# Patient Record
Sex: Female | Born: 1955
Health system: Southern US, Community
[De-identification: ages and names within clinical notes are randomized; demographics above are authoritative.]

## PROBLEM LIST (undated history)

## (undated) DIAGNOSIS — R519 Headache, unspecified: Secondary | ICD-10-CM

## (undated) DIAGNOSIS — F32A Depression, unspecified: Secondary | ICD-10-CM

## (undated) DIAGNOSIS — E785 Hyperlipidemia, unspecified: Secondary | ICD-10-CM

## (undated) DIAGNOSIS — R51 Headache: Secondary | ICD-10-CM

## (undated) DIAGNOSIS — F329 Major depressive disorder, single episode, unspecified: Secondary | ICD-10-CM

## (undated) DIAGNOSIS — I10 Essential (primary) hypertension: Secondary | ICD-10-CM

## (undated) DIAGNOSIS — T7840XA Allergy, unspecified, initial encounter: Secondary | ICD-10-CM

## (undated) DIAGNOSIS — F419 Anxiety disorder, unspecified: Secondary | ICD-10-CM

## (undated) DIAGNOSIS — R011 Cardiac murmur, unspecified: Secondary | ICD-10-CM

## (undated) DIAGNOSIS — M1990 Unspecified osteoarthritis, unspecified site: Secondary | ICD-10-CM

## (undated) DIAGNOSIS — Z86010 Personal history of colonic polyps: Principal | ICD-10-CM

## (undated) HISTORY — PX: OTHER SURGICAL HISTORY: SHX169

## (undated) HISTORY — DX: Essential (primary) hypertension: I10

## (undated) HISTORY — DX: Allergy, unspecified, initial encounter: T78.40XA

## (undated) HISTORY — DX: Hyperlipidemia, unspecified: E78.5

## (undated) HISTORY — PX: JOINT REPLACEMENT: SHX530

## (undated) HISTORY — DX: Personal history of colonic polyps: Z86.010

## (undated) HISTORY — DX: Anxiety disorder, unspecified: F41.9

## (undated) HISTORY — DX: Depression, unspecified: F32.A

## (undated) HISTORY — DX: Major depressive disorder, single episode, unspecified: F32.9

## (undated) HISTORY — PX: FUNCTIONAL ENDOSCOPIC SINUS SURGERY: SUR616

---

## 1991-11-14 HISTORY — PX: TUBAL LIGATION: SHX77

## 1998-06-14 ENCOUNTER — Ambulatory Visit (HOSPITAL_COMMUNITY): Admission: RE | Admit: 1998-06-14 | Discharge: 1998-06-14 | Payer: Self-pay | Admitting: Obstetrics and Gynecology

## 2000-03-20 ENCOUNTER — Other Ambulatory Visit: Admission: RE | Admit: 2000-03-20 | Discharge: 2000-03-20 | Payer: Self-pay | Admitting: Obstetrics and Gynecology

## 2001-04-23 ENCOUNTER — Other Ambulatory Visit: Admission: RE | Admit: 2001-04-23 | Discharge: 2001-04-23 | Payer: Self-pay | Admitting: Obstetrics and Gynecology

## 2002-05-07 ENCOUNTER — Other Ambulatory Visit: Admission: RE | Admit: 2002-05-07 | Discharge: 2002-05-07 | Payer: Self-pay | Admitting: Obstetrics and Gynecology

## 2003-05-20 ENCOUNTER — Other Ambulatory Visit: Admission: RE | Admit: 2003-05-20 | Discharge: 2003-05-20 | Payer: Self-pay | Admitting: Obstetrics & Gynecology

## 2004-07-26 ENCOUNTER — Other Ambulatory Visit: Admission: RE | Admit: 2004-07-26 | Discharge: 2004-07-26 | Payer: Self-pay | Admitting: Obstetrics and Gynecology

## 2004-11-13 HISTORY — PX: COLONOSCOPY: SHX174

## 2005-10-13 ENCOUNTER — Other Ambulatory Visit: Admission: RE | Admit: 2005-10-13 | Discharge: 2005-10-13 | Payer: Self-pay | Admitting: Obstetrics and Gynecology

## 2005-10-31 ENCOUNTER — Ambulatory Visit: Payer: Self-pay | Admitting: Internal Medicine

## 2005-11-09 ENCOUNTER — Ambulatory Visit: Payer: Self-pay | Admitting: Internal Medicine

## 2006-12-06 ENCOUNTER — Encounter: Admission: RE | Admit: 2006-12-06 | Discharge: 2006-12-06 | Payer: Self-pay | Admitting: Obstetrics and Gynecology

## 2010-11-17 ENCOUNTER — Encounter: Payer: Self-pay | Admitting: Internal Medicine

## 2010-11-17 ENCOUNTER — Telehealth: Payer: Self-pay | Admitting: Internal Medicine

## 2010-12-04 ENCOUNTER — Encounter: Payer: Self-pay | Admitting: Obstetrics and Gynecology

## 2010-12-15 NOTE — Progress Notes (Signed)
Summary: Schedule recall Colonoscopy  Phone Note Outgoing Call Call back at [facility] Phone [phone]   Call placed by: Christie Nottingham CMA Duncan Dull),  November 17, 2010 1:47 PM Call placed to: Patient Summary of Call: Called to schedule recall colonoscopy with propofol. All numbers in EMR and in IDX are either disconnected or no longer this patients number. Letter has been sent to the home adress we have on file.  Initial call taken by: Christie Nottingham CMA (AAMA),  November 17, 2010 1:48 PM

## 2010-12-15 NOTE — Letter (Signed)
Summary: Colonoscopy Letter   Gastroenterology  [address] [place], [place] [address]   Phone: [phone]  Fax: [phone]      November 17, 2010 MRN: [id number]   [facility] [address] [place], [place]  [address]   Dear Kristin Benjamin,   According to your medical record, it is time for you to schedule a Colonoscopy. The American Cancer Society recommends this procedure as a method to detect early colon cancer. Patients with a family history of colon cancer, or a personal history of colon polyps or inflammatory bowel disease are at increased risk.  This letter has been generated based on the recommendations made at the time of your procedure. If you feel that in your particular situation this may no longer apply, please contact our office.  Please call our office at [phone] to schedule this appointment or to update your records at your earliest convenience.  Thank you for cooperating with [place] to provide you with the very best care possible.   Sincerely,    [facility] [phone]

## 2012-07-29 ENCOUNTER — Encounter: Payer: Self-pay | Admitting: Internal Medicine

## 2012-07-30 ENCOUNTER — Encounter: Payer: Self-pay | Admitting: Internal Medicine

## 2012-08-29 ENCOUNTER — Telehealth: Payer: Self-pay | Admitting: Internal Medicine

## 2012-08-29 NOTE — Telephone Encounter (Signed)
Patient reports that she does not have a family history of colon cancer as listed on the colonoscopy 11/09/2005.  She states that she has since found out that the history she provided was incorrect.  She apologizes for the misinformation and asks that Dr. Leone Payor review her chart for the proper recall date.  No current problems.  Colon from 2006 was normal.  I will place a copy in your office for review.  Please advise proper recall change date if any.

## 2012-08-29 NOTE — Telephone Encounter (Signed)
She is correct - next colon 10/2015

## 2012-08-30 NOTE — Telephone Encounter (Signed)
Patient aware.  Recall date placed. Both pre-visit and colon appt cancelled

## 2012-09-17 ENCOUNTER — Encounter: Payer: Self-pay | Admitting: Internal Medicine

## 2014-02-23 ENCOUNTER — Other Ambulatory Visit (HOSPITAL_COMMUNITY): Payer: Self-pay | Admitting: Internal Medicine

## 2014-02-23 ENCOUNTER — Ambulatory Visit (HOSPITAL_COMMUNITY): Payer: Managed Care, Other (non HMO) | Attending: Cardiovascular Disease | Admitting: Radiology

## 2014-02-23 DIAGNOSIS — R011 Cardiac murmur, unspecified: Secondary | ICD-10-CM

## 2014-02-23 NOTE — Progress Notes (Signed)
Echocardiogram performed.  

## 2015-11-25 ENCOUNTER — Encounter: Payer: Self-pay | Admitting: Internal Medicine

## 2015-12-01 ENCOUNTER — Other Ambulatory Visit: Payer: Self-pay | Admitting: Obstetrics and Gynecology

## 2015-12-01 DIAGNOSIS — R928 Other abnormal and inconclusive findings on diagnostic imaging of breast: Secondary | ICD-10-CM

## 2015-12-07 ENCOUNTER — Ambulatory Visit
Admission: RE | Admit: 2015-12-07 | Discharge: 2015-12-07 | Disposition: A | Discharge status: Home / Self Care | Payer: 59 | Source: Ambulatory Visit | Attending: Obstetrics and Gynecology | Admitting: Obstetrics and Gynecology

## 2015-12-07 DIAGNOSIS — R928 Other abnormal and inconclusive findings on diagnostic imaging of breast: Secondary | ICD-10-CM

## 2016-07-06 ENCOUNTER — Other Ambulatory Visit: Payer: Self-pay | Admitting: Orthopaedic Surgery

## 2016-07-06 DIAGNOSIS — M25561 Pain in right knee: Secondary | ICD-10-CM

## 2016-07-18 ENCOUNTER — Ambulatory Visit
Admission: RE | Admit: 2016-07-18 | Discharge: 2016-07-18 | Disposition: A | Discharge status: Home / Self Care | Payer: 59 | Source: Ambulatory Visit | Attending: Orthopaedic Surgery | Admitting: Orthopaedic Surgery

## 2016-07-18 DIAGNOSIS — M25561 Pain in right knee: Secondary | ICD-10-CM

## 2016-08-13 HISTORY — PX: KNEE ARTHROSCOPY W/ MENISCAL REPAIR: SHX1877

## 2016-08-31 DIAGNOSIS — S83231D Complex tear of medial meniscus, current injury, right knee, subsequent encounter: Secondary | ICD-10-CM | POA: Diagnosis not present

## 2016-09-06 ENCOUNTER — Ambulatory Visit (INDEPENDENT_AMBULATORY_CARE_PROVIDER_SITE_OTHER): Payer: 59 | Admitting: Orthopaedic Surgery

## 2016-09-06 ENCOUNTER — Encounter (INDEPENDENT_AMBULATORY_CARE_PROVIDER_SITE_OTHER): Payer: Self-pay | Admitting: Orthopaedic Surgery

## 2016-09-06 DIAGNOSIS — Z9889 Other specified postprocedural states: Secondary | ICD-10-CM

## 2016-09-06 MED ORDER — DICLOFENAC SODIUM 1 % TD GEL: 4.0000 g | Freq: Four times a day (QID) | TRANSDERMAL | Status: DC | PRN

## 2016-09-06 NOTE — Progress Notes (Signed)
   Office Visit Note   Patient: Kristin Benjamin           Date of Birth: 10-Jun-1956           MRN: [id number] Visit Date: 09/06/2016              Requested by: No referring provider defined for this encounter. PCP: Henrine Screws, MD   Assessment & Plan: Visit Diagnoses:  1. Status post arthroscopic surgery of right knee     Plan: Quad strengthening exercises; gave handouts on hyaluronic acid to consider down the road  We went over her scope pictures as well  Follow-Up Instructions: Return in about 4 weeks (around 10/04/2016).   Orders:  No orders of the defined types were placed in this encounter.  Meds ordered this encounter  Medications  . fluticasone (FLONASE) 50 MCG/ACT nasal spray  . amphetamine-dextroamphetamine (ADDERALL) 5 MG tablet  . clonazePAM (KLONOPIN) 1 MG tablet  . losartan-hydrochlorothiazide (HYZAAR) 100-12.5 MG tablet  . nitrofurantoin, macrocrystal-monohydrate, (MACROBID) 100 MG capsule  . diclofenac sodium (VOLTAREN) 1 % transdermal gel 4 g      Procedures: No procedures performed   Clinical Data: No additional findings.   Subjective: Chief Complaint  Patient presents with  . Right Knee - Routine Post Op    Doing great. Stitches intact.         HPI  Review of Systems   Objective: Vital Signs: There were no vitals taken for this visit.  Physical Exam  Ortho Exam No significant effusion right knee Sutures removed. Good ROM   Specialty Comments:  No specialty comments available.  Imaging: No results found.   PMFS History: There are no active problems to display for this patient.  No past medical history on file.  No family history on file.  No past surgical history on file. Social History   Occupational History  . Not on file.   Social History Main Topics  . Smoking status: Current Every Day Smoker  . Smokeless tobacco: Never Used  . Alcohol use Not on file  . Drug use: Unknown  . Sexual activity: Not on  file

## 2016-09-12 ENCOUNTER — Telehealth (INDEPENDENT_AMBULATORY_CARE_PROVIDER_SITE_OTHER): Payer: Self-pay | Admitting: *Deleted

## 2016-09-12 NOTE — Telephone Encounter (Signed)
Patient called and states she seen Dr. Ninfa Linden on 09/06/16 and her prescribed her Voltaren. Patient states that her RX wasn't sent to the her pharmacy. Pt pharmacy is [facility] on [address] in [place] . Pleae advise Provider. Thank you .

## 2016-09-13 NOTE — Telephone Encounter (Signed)
Called into pharmacy

## 2016-10-03 ENCOUNTER — Ambulatory Visit (INDEPENDENT_AMBULATORY_CARE_PROVIDER_SITE_OTHER): Payer: 59 | Admitting: Orthopaedic Surgery

## 2016-10-03 DIAGNOSIS — Z9889 Other specified postprocedural states: Secondary | ICD-10-CM

## 2016-10-03 NOTE — Progress Notes (Signed)
As his [place] is doing very well postoperatively. She is just over a month out from a right knee arthroscopy with a partial medial meniscectomy and we found areas of near full-thickness cartilage loss throughout her knee mainly at the medial and lateral compartments. She's working on quad strengthening exercises and feels like she is doing great.  On examination of her right knee there is no swelling at all. There is no effusion she has excellent range of motion as well. Her knee is MC stable.  At this standpoint she'll follow-up as needed. She is already had a handout on hyaluronic acid for the we'll consider in the future. If her knee starts bothering her enough we would see her for a steroid injection followed by ordering the hyaluronic acid. She'll let us know.

## 2016-12-18 LAB — HM DEXA SCAN: HM Dexa Scan: NORMAL

## 2017-01-09 ENCOUNTER — Encounter: Payer: Self-pay | Admitting: Obstetrics and Gynecology

## 2017-04-30 ENCOUNTER — Encounter: Payer: Self-pay | Admitting: Internal Medicine

## 2017-06-22 ENCOUNTER — Ambulatory Visit (AMBULATORY_SURGERY_CENTER): Payer: Self-pay | Admitting: *Deleted

## 2017-06-22 VITALS — Ht 69.0 in | Wt 175.0 lb

## 2017-06-22 DIAGNOSIS — Z1211 Encounter for screening for malignant neoplasm of colon: Secondary | ICD-10-CM

## 2017-06-22 NOTE — Progress Notes (Signed)
Patient denies any allergies to eggs or soy. Patient denies any problems with anesthesia/sedation. Patient denies any oxygen use at home and does not take any diet/weight loss medications. EMMI education assisgned to patient on colonoscopy, this was explained and instructions given to patient. 

## 2017-06-25 ENCOUNTER — Encounter: Payer: Self-pay | Admitting: Internal Medicine

## 2017-07-06 ENCOUNTER — Ambulatory Visit (AMBULATORY_SURGERY_CENTER): Payer: BLUE CROSS/BLUE SHIELD | Admitting: Internal Medicine

## 2017-07-06 ENCOUNTER — Encounter: Payer: Self-pay | Admitting: Internal Medicine

## 2017-07-06 VITALS — BP 107/70 | HR 71 | Temp 97.5°F | Resp 19 | Ht 69.0 in | Wt 175.0 lb

## 2017-07-06 DIAGNOSIS — D120 Benign neoplasm of cecum: Secondary | ICD-10-CM | POA: Diagnosis not present

## 2017-07-06 DIAGNOSIS — Z1211 Encounter for screening for malignant neoplasm of colon: Secondary | ICD-10-CM | POA: Diagnosis present

## 2017-07-06 DIAGNOSIS — Z1212 Encounter for screening for malignant neoplasm of rectum: Secondary | ICD-10-CM

## 2017-07-06 DIAGNOSIS — D123 Benign neoplasm of transverse colon: Secondary | ICD-10-CM

## 2017-07-06 DIAGNOSIS — D122 Benign neoplasm of ascending colon: Secondary | ICD-10-CM | POA: Diagnosis not present

## 2017-07-06 MED ORDER — SODIUM CHLORIDE 0.9 % IV SOLN: 500.0000 mL | INTRAVENOUS | Status: DC

## 2017-07-06 NOTE — Progress Notes (Signed)
Called to room to assist during endoscopic procedure.  Patient ID and intended procedure confirmed with present staff. Received instructions for my participation in the procedure from the performing physician.  

## 2017-07-06 NOTE — Op Note (Signed)
[facility] Patient Name: Kristin Benjamin Procedure Date: 07/06/2017 10:51 AM MRN: [id number] Endoscopist: Gatha Mayer , MD Age: 61 Referring MD:  Date of Birth: [birth year] Gender: Female Account #: [id number] Procedure:                Colonoscopy Indications:              Screening for colorectal malignant neoplasm Medicines:                Propofol per Anesthesia, Monitored Anesthesia Care Procedure:                Pre-Anesthesia Assessment:                           - Prior to the procedure, a History and Physical                            was performed, and patient medications and                            allergies were reviewed. The patient's tolerance of                            previous anesthesia was also reviewed. The risks                            and benefits of the procedure and the sedation                            options and risks were discussed with the patient.                            All questions were answered, and informed consent                            was obtained. Prior Anticoagulants: The patient has                            taken no previous anticoagulant or antiplatelet                            agents. ASA Grade Assessment: II - A patient with                            mild systemic disease. After reviewing the risks                            and benefits, the patient was deemed in                            satisfactory condition to undergo the procedure.                           After obtaining informed consent, the colonoscope  was passed under direct vision. Throughout the                            procedure, the patient's blood pressure, pulse, and                            oxygen saturations were monitored continuously. The                            Colonoscope was introduced through the anus and                            advanced to the the cecum, identified by   appendiceal orifice and ileocecal valve. The                            colonoscopy was performed without difficulty. The                            patient tolerated the procedure well. The quality                            of the bowel preparation was good. The ileocecal                            valve, appendiceal orifice, and rectum were                            photographed. The bowel preparation used was                            Miralax. Scope In: 10:59:17 AM Scope Out: 11:18:14 AM Scope Withdrawal Time: 0 hours 14 minutes 5 seconds  Total Procedure Duration: 0 hours 18 minutes 57 seconds  Findings:                 The perianal and digital rectal examinations were                            normal.                           Three flat and sessile polyps were found in the                            transverse colon, ascending colon and cecum. The                            polyps were diminutive in size. These polyps were                            removed with a cold snare. Resection and retrieval                            were complete. Verification of patient  identification for the specimen was done. Estimated                            blood loss was minimal.                           Many small and large-mouthed diverticula were found                            in the left colon.                           The exam was otherwise without abnormality on                            direct and retroflexion views. Complications:            No immediate complications. Estimated Blood Loss:     Estimated blood loss was minimal. Impression:               - Three diminutive polyps in the transverse colon,                            in the ascending colon and in the cecum, removed                            with a cold snare. Resected and retrieved.                           - Diverticulosis in the left colon.                           - The examination  was otherwise normal on direct                            and retroflexion views. Recommendation:           - Patient has a contact number available for                            emergencies. The signs and symptoms of potential                            delayed complications were discussed with the                            patient. Return to normal activities tomorrow.                            Written discharge instructions were provided to the                            patient.                           - Resume previous diet.                           -  Continue present medications.                           - Repeat colonoscopy is recommended. The                            colonoscopy date will be determined after pathology                            results from today's exam become available for                            review. Gatha Mayer, MD 07/06/2017 11:22:45 AM This report has been signed electronically.

## 2017-07-06 NOTE — Progress Notes (Signed)
Pt's states no medical or surgical changes since previsit or office visit. 

## 2017-07-06 NOTE — Patient Instructions (Addendum)
I found and removed 3 small polyps that look benign.  You also have a condition called diverticulosis - common and not usually a problem. Please read the handout provided.   I will let you know pathology results and when to have another routine colonoscopy by mail and/or My Chart.  I appreciate the opportunity to care for you. Gatha Mayer, MD, FACG  YOU HAD AN ENDOSCOPIC PROCEDURE TODAY AT [facility]:   Refer to the procedure report that was given to you for any specific questions about what was found during the examination.  If the procedure report does not answer your questions, please call your gastroenterologist to clarify.  If you requested that your care partner not be given the details of your procedure findings, then the procedure report has been included in a sealed envelope for you to review at your convenience later.  YOU SHOULD EXPECT: Some feelings of bloating in the abdomen. Passage of more gas than usual.  Walking can help get rid of the air that was put into your GI tract during the procedure and reduce the bloating. If you had a lower endoscopy (such as a colonoscopy or flexible sigmoidoscopy) you may notice spotting of blood in your stool or on the toilet paper. If you underwent a bowel prep for your procedure, you may not have a normal bowel movement for a few days.  Please Note:  You might notice some irritation and congestion in your nose or some drainage.  This is from the oxygen used during your procedure.  There is no need for concern and it should clear up in a day or so.  SYMPTOMS TO REPORT IMMEDIATELY:   Following lower endoscopy (colonoscopy or flexible sigmoidoscopy):  Excessive amounts of blood in the stool  Significant tenderness or worsening of abdominal pains  Swelling of the abdomen that is new, acute  Fever of 100F or higher   Following upper endoscopy (EGD)  Vomiting of blood or coffee ground material  New chest pain or  pain under the shoulder blades  Painful or persistently difficult swallowing  New shortness of breath  Fever of 100F or higher  Black, tarry-looking stools  For urgent or emergent issues, a gastroenterologist can be reached at any hour by calling [phone].   DIET:  We do recommend a small meal at first, but then you may proceed to your regular diet.  Drink plenty of fluids but you should avoid alcoholic beverages for 24 hours.  ACTIVITY:  You should plan to take it easy for the rest of today and you should NOT DRIVE or use heavy machinery until tomorrow (because of the sedation medicines used during the test).    FOLLOW UP: Our staff will call the number listed on your records the next business day following your procedure to check on you and address any questions or concerns that you may have regarding the information given to you following your procedure. If we do not reach you, we will leave a message.  However, if you are feeling well and you are not experiencing any problems, there is no need to return our call.  We will assume that you have returned to your regular daily activities without incident.  If any biopsies were taken you will be contacted by phone or by letter within the next 1-3 weeks.  Please call us at [phone] if you have not heard about the biopsies in 3 weeks.    SIGNATURES/CONFIDENTIALITY: You  and/or your care partner have signed paperwork which will be entered into your electronic medical record.  These signatures attest to the fact that that the information above on your After Visit Summary has been reviewed and is understood.  Full responsibility of the confidentiality of this discharge information lies with you and/or your care-partner.  Polyp, and diverticulosis information given.

## 2017-07-06 NOTE — Progress Notes (Signed)
To recovery, report to Scott, RN, VSS 

## 2017-07-09 ENCOUNTER — Telehealth: Payer: Self-pay

## 2017-07-09 ENCOUNTER — Telehealth: Payer: Self-pay | Admitting: *Deleted

## 2017-07-09 NOTE — Telephone Encounter (Signed)
Attempted to reach pt. With follow up call following endoscopic procedure 07/06/2017.   LM on pt. Ans. Machine.   Will try to reach pt. Again later today.

## 2017-07-09 NOTE — Telephone Encounter (Signed)
  Follow up Call-  Call back number 07/06/2017  Post procedure Call Back phone  # [phone]  Permission to leave phone message Yes  Some recent data might be hidden     Patient questions:  Do you have a fever, pain , or abdominal swelling? No. Pain Score  0 *  Have you tolerated food without any problems? Yes.    Have you been able to return to your normal activities? Yes.    Do you have any questions about your discharge instructions: Diet   No. Medications  No. Follow up visit  No.  Do you have questions or concerns about your Care? No.  Actions: * If pain score is 4 or above: No action needed, pain <4.

## 2017-07-17 ENCOUNTER — Encounter: Payer: Self-pay | Admitting: Internal Medicine

## 2017-07-17 DIAGNOSIS — Z86010 Personal history of colonic polyps: Secondary | ICD-10-CM

## 2017-07-17 DIAGNOSIS — Z860101 Personal history of adenomatous and serrated colon polyps: Secondary | ICD-10-CM

## 2017-07-17 HISTORY — DX: Personal history of adenomatous and serrated colon polyps: Z86.0101

## 2017-07-17 HISTORY — DX: Personal history of colonic polyps: Z86.010

## 2017-07-17 NOTE — Progress Notes (Signed)
3 diminutive adenomas Recall colonoscopy 2021 - 3 yrs

## 2017-09-23 DIAGNOSIS — R350 Frequency of micturition: Secondary | ICD-10-CM | POA: Diagnosis not present

## 2017-09-23 DIAGNOSIS — N3000 Acute cystitis without hematuria: Secondary | ICD-10-CM | POA: Diagnosis not present

## 2017-10-01 ENCOUNTER — Ambulatory Visit (INDEPENDENT_AMBULATORY_CARE_PROVIDER_SITE_OTHER): Payer: 59 | Admitting: Physician Assistant

## 2017-12-20 DIAGNOSIS — Z1231 Encounter for screening mammogram for malignant neoplasm of breast: Secondary | ICD-10-CM | POA: Diagnosis not present

## 2017-12-20 DIAGNOSIS — Z01419 Encounter for gynecological examination (general) (routine) without abnormal findings: Secondary | ICD-10-CM | POA: Diagnosis not present

## 2017-12-20 DIAGNOSIS — Z6826 Body mass index (BMI) 26.0-26.9, adult: Secondary | ICD-10-CM | POA: Diagnosis not present

## 2017-12-27 ENCOUNTER — Ambulatory Visit
Admission: RE | Admit: 2017-12-27 | Discharge: 2017-12-27 | Disposition: A | Discharge status: Home / Self Care | Payer: BLUE CROSS/BLUE SHIELD | Source: Ambulatory Visit | Attending: Internal Medicine | Admitting: Internal Medicine

## 2017-12-27 ENCOUNTER — Other Ambulatory Visit: Payer: Self-pay | Admitting: Internal Medicine

## 2017-12-27 DIAGNOSIS — Z79899 Other long term (current) drug therapy: Secondary | ICD-10-CM | POA: Diagnosis not present

## 2017-12-27 DIAGNOSIS — F909 Attention-deficit hyperactivity disorder, unspecified type: Secondary | ICD-10-CM | POA: Diagnosis not present

## 2017-12-27 DIAGNOSIS — I10 Essential (primary) hypertension: Secondary | ICD-10-CM | POA: Diagnosis not present

## 2017-12-27 DIAGNOSIS — E785 Hyperlipidemia, unspecified: Secondary | ICD-10-CM | POA: Diagnosis not present

## 2017-12-27 DIAGNOSIS — Z0000 Encounter for general adult medical examination without abnormal findings: Secondary | ICD-10-CM | POA: Diagnosis not present

## 2017-12-27 DIAGNOSIS — F17210 Nicotine dependence, cigarettes, uncomplicated: Secondary | ICD-10-CM

## 2017-12-27 DIAGNOSIS — N390 Urinary tract infection, site not specified: Secondary | ICD-10-CM | POA: Diagnosis not present

## 2017-12-27 DIAGNOSIS — E559 Vitamin D deficiency, unspecified: Secondary | ICD-10-CM | POA: Diagnosis not present

## 2018-03-28 ENCOUNTER — Ambulatory Visit (INDEPENDENT_AMBULATORY_CARE_PROVIDER_SITE_OTHER): Payer: BLUE CROSS/BLUE SHIELD | Admitting: Orthopaedic Surgery

## 2018-03-28 ENCOUNTER — Encounter (INDEPENDENT_AMBULATORY_CARE_PROVIDER_SITE_OTHER): Payer: Self-pay | Admitting: Orthopaedic Surgery

## 2018-03-28 DIAGNOSIS — G8929 Other chronic pain: Secondary | ICD-10-CM | POA: Diagnosis not present

## 2018-03-28 DIAGNOSIS — M25561 Pain in right knee: Secondary | ICD-10-CM | POA: Diagnosis not present

## 2018-03-28 DIAGNOSIS — M1711 Unilateral primary osteoarthritis, right knee: Secondary | ICD-10-CM | POA: Diagnosis not present

## 2018-03-28 NOTE — Progress Notes (Signed)
Office Visit Note   Patient: Kristin Benjamin           Date of Birth: [birth year]           MRN: [id number] Visit Date: 03/28/2018              Requested by: Josetta Huddle, MD [address] [place], [place] [address] PCP: Josetta Huddle, MD   Assessment & Plan: Visit Diagnoses:  1. Chronic pain of right knee   2. Unilateral primary osteoarthritis, right knee     Plan: At this point I do feel that a steroid injection is warranted in her right knee.  Also feel that she is a perfect candidate for hyaluronic acid in the right knee based on her previous arthroscopic findings of significant arthritis in the medial femoral condyle.  She agrees with this plan as well.  She tolerated steroid injection well today.  We will see her back in a month for hyaluronic acid injection in her right knee.  Follow-Up Instructions: Return in about 1 month (around 04/25/2018).   Orders:  No orders of the defined types were placed in this encounter.  No orders of the defined types were placed in this encounter.     Procedures: No procedures performed   Clinical Data: No additional findings.   Subjective: Chief Complaint  Patient presents with  . Right Knee - Pain  The patient is someone well-known to me.  She had a right knee arthroscopy in 2017 for a lar medial meniscal tear.  She also had significant cartilage loss of the medial femoral condyle.  She is done well for long period time is been developing right knee pain again on the medial joint line.  She comes in today hoping to have a steroid injection.  She does have a significant amount of water aerobics and water physical therapy and is staying as active as possible.  HPI  Review of Systems She denies any headache, chest pain, shortness of breath, fever, chills, nausea, vomiting  Objective: Vital Signs: There were no vitals taken for this visit.  Physical Exam She is alert and oriented x3 in no acute distress Ortho  Exam Examination of right knee does show medial joint line tenderness and slight patellofemoral crepitation.  McMurray's exam is negative.   Specialty Comments:  No specialty comments available.  Imaging: No results found.   PMFS History: Patient Active Problem List   Diagnosis Date Noted  . Unilateral primary osteoarthritis, right knee 03/28/2018  . Chronic pain of right knee 03/28/2018  . Hx of adenomatous colonic polyps 07/17/2017   Past Medical History:  Diagnosis Date  . Anxiety   . Depression   . Hx of adenomatous colonic polyps 07/17/2017  . Hyperlipidemia   . Hypertension     Family History  Problem Relation Age of Onset  . Colon cancer Neg Hx   . Esophageal cancer Neg Hx   . Rectal cancer Neg Hx   . Stomach cancer Neg Hx     Past Surgical History:  Procedure Laterality Date  . COLONOSCOPY  2006   and 2018  . KNEE ARTHROSCOPY W/ MENISCAL REPAIR Right 08/2016  . TUBAL LIGATION  1993   Social History   Occupational History  . Not on file  Tobacco Use  . Smoking status: Current Every Day Smoker    Packs/day: 0.50    Types: Cigarettes  . Smokeless tobacco: Never Used  Substance and Sexual Activity  . Alcohol  use: Yes    Alcohol/week: 1.8 - 3.0 oz    Types: 3 - 5 Glasses of wine per week    Comment: 3-5 wine per week per pt  . Drug use: No  . Sexual activity: Not on file

## 2018-04-03 ENCOUNTER — Telehealth (INDEPENDENT_AMBULATORY_CARE_PROVIDER_SITE_OTHER): Payer: Self-pay

## 2018-04-03 NOTE — Telephone Encounter (Signed)
Submitted application online for synviscOne injection, right knee.

## 2018-04-29 ENCOUNTER — Telehealth (INDEPENDENT_AMBULATORY_CARE_PROVIDER_SITE_OTHER): Payer: Self-pay

## 2018-04-29 NOTE — Telephone Encounter (Signed)
Patient approved for SynviscOne injection, right knee. Buy & Bill Covered at 80% after deductible has been met, plus 20% OOP. No PA required.

## 2018-05-02 ENCOUNTER — Encounter (INDEPENDENT_AMBULATORY_CARE_PROVIDER_SITE_OTHER): Payer: Self-pay | Admitting: Orthopaedic Surgery

## 2018-05-02 ENCOUNTER — Ambulatory Visit (INDEPENDENT_AMBULATORY_CARE_PROVIDER_SITE_OTHER): Payer: BLUE CROSS/BLUE SHIELD | Admitting: Orthopaedic Surgery

## 2018-05-02 DIAGNOSIS — M1711 Unilateral primary osteoarthritis, right knee: Secondary | ICD-10-CM | POA: Diagnosis not present

## 2018-05-02 MED ORDER — HYLAN G-F 20 48 MG/6ML IX SOSY
48.0000 mg | PREFILLED_SYRINGE | INTRA_ARTICULAR | Status: AC | PRN
  Administered 2018-05-02: 48 mg via INTRA_ARTICULAR

## 2018-05-02 NOTE — Progress Notes (Signed)
   Procedure Note  Patient: Kristin Benjamin             Date of Birth: 06-17-1956           MRN: [id number]             Visit Date: 05/02/2018  Procedures: Visit Diagnoses: Unilateral primary osteoarthritis, right knee  Large Joint Inj: R knee on 05/02/2018 1:19 PM Indications: pain and diagnostic evaluation Details: 22 G 1.5 in needle, superolateral approach  Arthrogram: No  Medications: 48 mg Hylan 48 MG/6ML Outcome: tolerated well, no immediate complications Procedure, treatment alternatives, risks and benefits explained, specific risks discussed. Consent was given by the patient. Immediately prior to procedure a time out was called to verify the correct patient, procedure, equipment, support staff and site/side marked as required. Patient was prepped and draped in the usual sterile fashion.    The patient is here today for scheduled hyaluronic acid injection with Synvisc 1 in her right knee to treat the pain from moderate osteoarthritis.  On exam there is no effusion of her knee.  She does have medial joint line tenderness and some patellofemoral crepitation.  She does have known arthritis in that knee.  She understands treatment options and our recommendation for this type of injection.  All questions and concerns were answered and addressed.  She tolerated injection well.  Follow-up will be as needed.

## 2018-06-03 ENCOUNTER — Emergency Department (HOSPITAL_BASED_OUTPATIENT_CLINIC_OR_DEPARTMENT_OTHER): Payer: BLUE CROSS/BLUE SHIELD

## 2018-06-03 ENCOUNTER — Emergency Department (HOSPITAL_BASED_OUTPATIENT_CLINIC_OR_DEPARTMENT_OTHER)
Admission: EM | Admit: 2018-06-03 | Discharge: 2018-06-03 | Disposition: A | Discharge status: Home / Self Care | Payer: BLUE CROSS/BLUE SHIELD | Attending: Emergency Medicine | Admitting: Emergency Medicine

## 2018-06-03 ENCOUNTER — Encounter (HOSPITAL_BASED_OUTPATIENT_CLINIC_OR_DEPARTMENT_OTHER): Payer: Self-pay

## 2018-06-03 ENCOUNTER — Other Ambulatory Visit: Payer: Self-pay

## 2018-06-03 DIAGNOSIS — M545 Low back pain: Secondary | ICD-10-CM | POA: Diagnosis not present

## 2018-06-03 DIAGNOSIS — I10 Essential (primary) hypertension: Secondary | ICD-10-CM | POA: Insufficient documentation

## 2018-06-03 DIAGNOSIS — F17210 Nicotine dependence, cigarettes, uncomplicated: Secondary | ICD-10-CM | POA: Diagnosis not present

## 2018-06-03 DIAGNOSIS — J029 Acute pharyngitis, unspecified: Secondary | ICD-10-CM | POA: Insufficient documentation

## 2018-06-03 DIAGNOSIS — H9201 Otalgia, right ear: Secondary | ICD-10-CM | POA: Insufficient documentation

## 2018-06-03 DIAGNOSIS — M542 Cervicalgia: Secondary | ICD-10-CM | POA: Diagnosis not present

## 2018-06-03 MED ORDER — GI COCKTAIL ~~LOC~~
30.0000 mL | Freq: Once | ORAL | Status: AC
  Administered 2018-06-03: 30 mL via ORAL
  Filled 2018-06-03: qty 30

## 2018-06-03 MED ORDER — IBUPROFEN 800 MG PO TABS: 800.0000 mg | ORAL_TABLET | Freq: Three times a day (TID) | ORAL | 0 refills | Status: DC | PRN

## 2018-06-03 NOTE — ED Notes (Signed)
Patient transported to X-ray 

## 2018-06-03 NOTE — ED Provider Notes (Signed)
Emergency Department Provider Note   I have reviewed the triage vital signs and the nursing notes.   HISTORY  Chief Complaint Sore Throat   HPI Kristin Benjamin is a 62 y.o. female with PMH of HLD and HTN's to the emergency department for evaluation of sudden onset sharp, right-sided neck pain radiating to the ear.  Symptoms began after drinking a glass of wine.  She states she was swallowing the when she suddenly felt the severe, sharp pain.  Since that time she has had pain with swallowing.  She states at first she thought something might of been in the wine. She has had paid in the right throat and jaw since that time with swallowing saliva.  She is able to do so without difficulty.  She denies any coughing or difficulty breathing.  She states she feels like no one got into her airway.  No hearing changes.    Past Medical History:  Diagnosis Date  . Anxiety   . Depression   . Hx of adenomatous colonic polyps 07/17/2017  . Hyperlipidemia   . Hypertension     Patient Active Problem List   Diagnosis Date Noted  . Unilateral primary osteoarthritis, right knee 03/28/2018  . Chronic pain of right knee 03/28/2018  . Hx of adenomatous colonic polyps 07/17/2017    Past Surgical History:  Procedure Laterality Date  . COLONOSCOPY  2006   and 2018  . KNEE ARTHROSCOPY W/ MENISCAL REPAIR Right 08/2016  . TUBAL LIGATION  1993    Allergies Patient has no known allergies.  Family History  Problem Relation Age of Onset  . Colon cancer Neg Hx   . Esophageal cancer Neg Hx   . Rectal cancer Neg Hx   . Stomach cancer Neg Hx     Social History Social History   Tobacco Use  . Smoking status: Current Every Day Smoker    Packs/day: 0.50    Types: Cigarettes  . Smokeless tobacco: Never Used  Substance Use Topics  . Alcohol use: Yes    Alcohol/week: 1.8 - 3.0 oz    Types: 3 - 5 Glasses of wine per week    Comment: 3-5 wine per week per pt  . Drug use: No    Review of  Systems  Constitutional: No fever/chills Eyes: No visual changes. ENT: Positive right sided sore throat.  Cardiovascular: Denies chest pain. Respiratory: Denies shortness of breath. Gastrointestinal: No abdominal pain.  No nausea, no vomiting.  No diarrhea.  No constipation. Genitourinary: Negative for dysuria. Musculoskeletal: Negative for back pain. Skin: Negative for rash. Neurological: Negative for headaches, focal weakness or numbness.  10-point ROS otherwise negative.  ____________________________________________   PHYSICAL EXAM:  VITAL SIGNS: ED Triage Vitals  Enc Vitals Group     BP 06/03/18 1922 (!) 114/92     Pulse Rate 06/03/18 1922 85     Resp 06/03/18 1922 18     Temp 06/03/18 1922 98.2 F (36.8 C)     Temp Source 06/03/18 1922 Oral     SpO2 06/03/18 1922 98 %     Weight 06/03/18 1921 166 lb (75.3 kg)     Height 06/03/18 1921 5\' 9"  (1.753 m)     Pain Score 06/03/18 1920 8   Constitutional: Alert and oriented. Well appearing and in no acute distress. Eyes: Conjunctivae are normal. Head: Atraumatic. Ears:  Healthy appearing ear canals and TMs bilaterally Nose: No congestion/rhinnorhea. Mouth/Throat: Mucous membranes are moist.  Oropharynx with mild erythema.  No exudate. No bleeding or laceration appreciated.  Neck: No stridor.   Respiratory: Normal respiratory effort.  Gastrointestinal: No distention.  Musculoskeletal: No lower extremity tenderness nor edema. No gross deformities of extremities. Neurologic:  Normal speech and language. No gross focal neurologic deficits are appreciated.  Skin:  Skin is warm, dry and intact. No rash noted.  ____________________________________________  RADIOLOGY  Dg Neck Soft Tissue  Result Date: 06/03/2018 CLINICAL DATA:  Right neck pain EXAM: NECK SOFT TISSUES - 1+ VIEW COMPARISON:  None. FINDINGS: There is no evidence of retropharyngeal soft tissue swelling or epiglottic enlargement. The cervical airway is  unremarkable and no radio-opaque foreign body identified. Degenerative facet disease bilaterally, right greater than left. Small calcification in the right neck soft tissue, likely vascular/carotid artery. IMPRESSION: No acute soft tissue abnormality. No radiopaque foreign body within the soft tissues. Early calcifications in the right carotid artery. Electronically Signed   By: Rolm Baptise M.D.   On: 06/03/2018 19:49    ____________________________________________   PROCEDURES  Procedure(s) performed:   Procedures  None ____________________________________________   INITIAL IMPRESSION / ASSESSMENT AND PLAN / ED COURSE  Pertinent labs & imaging results that were available during my care of the patient were reviewed by me and considered in my medical decision making (see chart for details).  Patient presents to the emergency department for evaluation of severe right-sided neck pain after swallowing.  She is drinking wine.  No apparent injury to the soft tissues visible in the posterior pharynx.  No clinical concern for peritonsillar abscess or strep throat.  Unclear if this is a pull or strain muscle.  Doubt foreign body or other injury.  Plan for plain film of the neck along with GI cocktail for symptom management.   Plain film reviewed. No acute findings. Patient feeling slightly better after topical meds. Suspect muscle strain in the posterior pharynx. No evidence of suggest allergic reaction, esophageal perforation, or deep space neck pathology. Plan for Motrin and home and PCP follow up if symptoms worsen.   At this time, I do not feel there is any life-threatening condition present. I have reviewed and discussed all results (EKG, imaging, lab, urine as appropriate), exam findings with patient. I have reviewed nursing notes and appropriate previous records.  I feel the patient is safe to be discharged home without further emergent workup. Discussed usual and customary return  precautions. Patient and family (if present) verbalize understanding and are comfortable with this plan.  Patient will follow-up with their primary care provider. If they do not have a primary care provider, information for follow-up has been provided to them. All questions have been answered.  ____________________________________________  FINAL CLINICAL IMPRESSION(S) / ED DIAGNOSES  Final diagnoses:  Sore throat     MEDICATIONS GIVEN DURING THIS VISIT:  Medications  gi cocktail (Maalox,Lidocaine,Donnatal) (30 mLs Oral Given 06/03/18 1933)     NEW OUTPATIENT MEDICATIONS STARTED DURING THIS VISIT:  Discharge Medication List as of 06/03/2018  8:26 PM    START taking these medications   Details  ibuprofen (ADVIL,MOTRIN) 800 MG tablet Take 1 tablet (800 mg total) by mouth every 8 (eight) hours as needed for moderate pain., Starting Mon 06/03/2018, Print        Note:  This document was prepared using Dragon voice recognition software and may include unintentional dictation errors.  Nanda Quinton, MD Emergency Medicine    Shelsea Hangartner, [facility], MD 06/03/18 2103

## 2018-06-03 NOTE — Discharge Instructions (Signed)
You were seen in the ED today with sore throat. The x-ray was normal. Use motrin as needed for pain. Follow up with your PCP if symptoms worsen.

## 2018-06-03 NOTE — ED Triage Notes (Addendum)
Pt states she choked on wine approx 45 min PTA-c/o pain to right side of throat, neck and ear-NAD-steady gait

## 2018-06-03 NOTE — ED Notes (Signed)
ED Provider at bedside. 

## 2018-07-16 ENCOUNTER — Telehealth (INDEPENDENT_AMBULATORY_CARE_PROVIDER_SITE_OTHER): Payer: Self-pay | Admitting: Orthopaedic Surgery

## 2018-07-16 NOTE — Telephone Encounter (Signed)
Returned call to patient left message to call back   [phone]

## 2018-07-17 DIAGNOSIS — I10 Essential (primary) hypertension: Secondary | ICD-10-CM | POA: Diagnosis not present

## 2018-07-17 DIAGNOSIS — H1010 Acute atopic conjunctivitis, unspecified eye: Secondary | ICD-10-CM | POA: Diagnosis not present

## 2018-07-23 DIAGNOSIS — F909 Attention-deficit hyperactivity disorder, unspecified type: Secondary | ICD-10-CM | POA: Diagnosis not present

## 2018-07-23 DIAGNOSIS — M179 Osteoarthritis of knee, unspecified: Secondary | ICD-10-CM | POA: Diagnosis not present

## 2018-07-23 DIAGNOSIS — N390 Urinary tract infection, site not specified: Secondary | ICD-10-CM | POA: Diagnosis not present

## 2018-07-23 DIAGNOSIS — G4700 Insomnia, unspecified: Secondary | ICD-10-CM | POA: Diagnosis not present

## 2018-07-23 DIAGNOSIS — F419 Anxiety disorder, unspecified: Secondary | ICD-10-CM | POA: Diagnosis not present

## 2018-07-30 DIAGNOSIS — Z23 Encounter for immunization: Secondary | ICD-10-CM | POA: Diagnosis not present

## 2018-08-27 ENCOUNTER — Encounter (INDEPENDENT_AMBULATORY_CARE_PROVIDER_SITE_OTHER): Payer: Self-pay | Admitting: Orthopaedic Surgery

## 2018-09-02 ENCOUNTER — Encounter (INDEPENDENT_AMBULATORY_CARE_PROVIDER_SITE_OTHER): Payer: Self-pay | Admitting: Orthopaedic Surgery

## 2018-09-02 ENCOUNTER — Ambulatory Visit (INDEPENDENT_AMBULATORY_CARE_PROVIDER_SITE_OTHER): Payer: BLUE CROSS/BLUE SHIELD | Admitting: Orthopaedic Surgery

## 2018-09-02 ENCOUNTER — Ambulatory Visit (INDEPENDENT_AMBULATORY_CARE_PROVIDER_SITE_OTHER): Payer: Self-pay

## 2018-09-02 DIAGNOSIS — G8929 Other chronic pain: Secondary | ICD-10-CM | POA: Diagnosis not present

## 2018-09-02 DIAGNOSIS — M25562 Pain in left knee: Secondary | ICD-10-CM | POA: Diagnosis not present

## 2018-09-02 MED ORDER — METHYLPREDNISOLONE ACETATE 40 MG/ML IJ SUSP
40.0000 mg | INTRAMUSCULAR | Status: AC | PRN
  Administered 2018-09-02: 40 mg via INTRA_ARTICULAR

## 2018-09-02 MED ORDER — LIDOCAINE HCL 1 % IJ SOLN
3.0000 mL | INTRAMUSCULAR | Status: AC | PRN
  Administered 2018-09-02: 3 mL

## 2018-09-02 NOTE — Progress Notes (Signed)
Office Visit Note   Patient: Kristin Benjamin           Date of Birth: 1956/01/08           MRN: [id number] Visit Date: 09/02/2018              Requested by: Josetta Huddle, MD [address] [place], [place] [address] PCP: Josetta Huddle, MD   Assessment & Plan: Visit Diagnoses:  1. Chronic pain of left knee     Plan: At this point given her effusion as well as locking catching in the knee an MRI is warranted to rule out meniscal tear.  We will see her back after the MRI.  All questions concerns were answered and addressed.  I did place a steroid in her knee today as well and this helped temporize her symptoms.  She felt better overall from that.  Follow-Up Instructions: Return in about 2 weeks (around 09/16/2018).   Orders:  Orders Placed This Encounter  Procedures  . Large Joint Inj  . XR Knee 1-2 Views Left   No orders of the defined types were placed in this encounter.     Procedures: Large Joint Inj: L knee on 09/02/2018 2:56 PM Indications: diagnostic evaluation and pain Details: 22 G 1.5 in needle, superolateral approach  Arthrogram: No  Medications: 3 mL lidocaine 1 %; 40 mg methylPREDNISolone acetate 40 MG/ML Outcome: tolerated well, no immediate complications Procedure, treatment alternatives, risks and benefits explained, specific risks discussed. Consent was given by the patient. Immediately prior to procedure a time out was called to verify the correct patient, procedure, equipment, support staff and site/side marked as required. Patient was prepped and draped in the usual sterile fashion.       Clinical Data: No additional findings.   Subjective: Chief Complaint  Patient presents with  . Left Knee - Pain  The patient comes in today with acute left knee pain and swelling.  She has known history of arthritis of her right knee and we did perform an arthroscopic intervention that helped temporize things she still gets right knee pain.  Both  her parents had knee replacements at a young age.  She is an active 62 years old.  She is had a lot of swelling now in the left knee and is having trouble sleeping.  She is on her feet all day working as well.  She has been having locking and catching in her left knee as well.  HPI  Review of Systems She currently denies any headache, chest pain, shortness of breath, fever, chills, nausea, vomiting.  Objective: Vital Signs: There were no vitals taken for this visit.  Physical Exam She is alert and oriented x3 and in no acute distress Ortho Exam Examination of her left knee does show moderate effusion.  There is medial joint tenderness and slight varus malalignment and the patella tracks well. Specialty Comments:  No specialty comments available.  Imaging: Xr Knee 1-2 Views Left  Result Date: 09/02/2018 2 views of left knee show moderate arthritic findings mainly involving the medial joint space with slight varus malalignment    PMFS History: Patient Active Problem List   Diagnosis Date Noted  . Unilateral primary osteoarthritis, right knee 03/28/2018  . Chronic pain of right knee 03/28/2018  . Hx of adenomatous colonic polyps 07/17/2017   Past Medical History:  Diagnosis Date  . Anxiety   . Depression   . Hx of adenomatous colonic polyps 07/17/2017  .  Hyperlipidemia   . Hypertension     Family History  Problem Relation Age of Onset  . Colon cancer Neg Hx   . Esophageal cancer Neg Hx   . Rectal cancer Neg Hx   . Stomach cancer Neg Hx     Past Surgical History:  Procedure Laterality Date  . COLONOSCOPY  2006   and 2018  . KNEE ARTHROSCOPY W/ MENISCAL REPAIR Right 08/2016  . TUBAL LIGATION  1993   Social History   Occupational History  . Not on file  Tobacco Use  . Smoking status: Current Every Day Smoker    Packs/day: 0.50    Types: Cigarettes  . Smokeless tobacco: Never Used  Substance and Sexual Activity  . Alcohol use: Yes    Alcohol/week: 3.0 - 5.0  standard drinks    Types: 3 - 5 Glasses of wine per week    Comment: 3-5 wine per week per pt  . Drug use: No  . Sexual activity: Not on file

## 2018-09-03 ENCOUNTER — Other Ambulatory Visit (INDEPENDENT_AMBULATORY_CARE_PROVIDER_SITE_OTHER): Payer: Self-pay

## 2018-09-03 DIAGNOSIS — M25562 Pain in left knee: Principal | ICD-10-CM

## 2018-09-03 DIAGNOSIS — G8929 Other chronic pain: Secondary | ICD-10-CM

## 2018-09-07 ENCOUNTER — Ambulatory Visit (HOSPITAL_BASED_OUTPATIENT_CLINIC_OR_DEPARTMENT_OTHER)
Admission: RE | Admit: 2018-09-07 | Discharge: 2018-09-07 | Disposition: A | Discharge status: Home / Self Care | Payer: BLUE CROSS/BLUE SHIELD | Source: Ambulatory Visit | Attending: Orthopaedic Surgery | Admitting: Orthopaedic Surgery

## 2018-09-07 DIAGNOSIS — S83282A Other tear of lateral meniscus, current injury, left knee, initial encounter: Secondary | ICD-10-CM | POA: Insufficient documentation

## 2018-09-07 DIAGNOSIS — G8929 Other chronic pain: Secondary | ICD-10-CM | POA: Insufficient documentation

## 2018-09-07 DIAGNOSIS — S83242A Other tear of medial meniscus, current injury, left knee, initial encounter: Secondary | ICD-10-CM | POA: Insufficient documentation

## 2018-09-07 DIAGNOSIS — M659 Synovitis and tenosynovitis, unspecified: Secondary | ICD-10-CM | POA: Diagnosis not present

## 2018-09-07 DIAGNOSIS — X58XXXA Exposure to other specified factors, initial encounter: Secondary | ICD-10-CM | POA: Insufficient documentation

## 2018-09-07 DIAGNOSIS — M1712 Unilateral primary osteoarthritis, left knee: Secondary | ICD-10-CM | POA: Insufficient documentation

## 2018-09-07 DIAGNOSIS — M25562 Pain in left knee: Secondary | ICD-10-CM

## 2018-09-07 DIAGNOSIS — M23322 Other meniscus derangements, posterior horn of medial meniscus, left knee: Secondary | ICD-10-CM | POA: Diagnosis not present

## 2018-09-17 ENCOUNTER — Encounter (INDEPENDENT_AMBULATORY_CARE_PROVIDER_SITE_OTHER): Payer: Self-pay | Admitting: Orthopaedic Surgery

## 2018-09-17 ENCOUNTER — Ambulatory Visit (INDEPENDENT_AMBULATORY_CARE_PROVIDER_SITE_OTHER): Payer: BLUE CROSS/BLUE SHIELD | Admitting: Orthopaedic Surgery

## 2018-09-17 DIAGNOSIS — M1712 Unilateral primary osteoarthritis, left knee: Secondary | ICD-10-CM

## 2018-09-17 NOTE — Progress Notes (Signed)
Office Visit Note   Patient: Kristin Benjamin           Date of Birth: Oct 17, 1956           MRN: [id number] Visit Date: 09/17/2018              Requested by: Josetta Huddle, MD [address] [place], [place] [address] PCP: Josetta Huddle, MD   Assessment & Plan: Visit Diagnoses:  1. Unilateral primary osteoarthritis, left knee     Plan: Due to patient's continued chronic pain in the left knee despite conservative treatment and time along with the imaging findings of tricompartmental arthritis recommend left total knee arthroplasty.  Patient agrees and would like to undergo a left total knee arthroplasty in the near future.  Questions were encouraged and answered by Dr. Ninfa Linden and myself at length today.  Postoperative protocol reviewed with the patient at length.  Risks were reviewed which include but are not limited to DVT/PE, wound healing problems, nerve vessel injury, blood loss, prolonged pain and worsening pain.  Discussed the need for the patient to cut back on smoking as much as possible as this can directly inhibit wound healing.  Have her follow-up at 2 weeks postop.  Follow-Up Instructions: Return for 2 weeks post-op.   Orders:  No orders of the defined types were placed in this encounter.  No orders of the defined types were placed in this encounter.     Procedures: No procedures performed   Clinical Data: No additional findings.   Subjective: Chief Complaint  Patient presents with  . Left Knee - Follow-up    HPI  Kristin Benjamin returns today to go over the MRI of her left knee.  She continues to have pain in the knee that is constant.  She sleeps with a pillow between her legs.  States the pain is so bad that some days she is had to work from home.  She is having catching locking sensation in the knee as well.  She states she would like to be more active and is unable to do activities she is enjoys such as walking and hiking.  She is using no  assistive device to ambulate.  She has had a cortisone injection in the knee which gave her minimal relief.  MRI left knee dated 09/07/2018 is reviewed with the patient by myself and Dr. Ninfa Linden along with a knee model for further visualization.  MRI showed a horizontal superior articular surface tear involving the mid body of the lateral meniscus and a fairly extensive posterior horn and mid body meniscal tear that has a flap tear component small portion of the meniscus is fragment was flipped into the medial gutter.  Medial compartment with moderate to advanced arthritic changes with areas of full or near full-thickness loss.  Lateral compartment with moderate degenerative changes.  Patella femoral component with mild degenerative changes.  Moderate joint effusion is present along with moderate synovitis.  Review of Systems Please see HPI otherwise negative  Objective: Vital Signs: There were no vitals taken for this visit.  Physical Exam  Constitutional: She is oriented to person, place, and time. She appears well-developed and well-nourished. No distress.  Pulmonary/Chest: Effort normal.  Neurological: She is alert and oriented to person, place, and time.  Skin: She is not diaphoretic.  Psychiatric: She has a normal mood and affect.    Ortho Exam Left knee good range of motion with full extension flexion to approximately 110 degrees.  Tenderness along medial and lateral joint line.  No abnormal warmth or erythema.  No instability valgus varus stressing.  Specialty Comments:  No specialty comments available.  Imaging: No results found.   PMFS History: Patient Active Problem List   Diagnosis Date Noted  . Unilateral primary osteoarthritis, right knee 03/28/2018  . Chronic pain of right knee 03/28/2018  . Hx of adenomatous colonic polyps 07/17/2017   Past Medical History:  Diagnosis Date  . Anxiety   . Depression   . Hx of adenomatous colonic polyps 07/17/2017  .  Hyperlipidemia   . Hypertension     Family History  Problem Relation Age of Onset  . Colon cancer Neg Hx   . Esophageal cancer Neg Hx   . Rectal cancer Neg Hx   . Stomach cancer Neg Hx     Past Surgical History:  Procedure Laterality Date  . COLONOSCOPY  2006   and 2018  . KNEE ARTHROSCOPY W/ MENISCAL REPAIR Right 08/2016  . TUBAL LIGATION  1993   Social History   Occupational History  . Not on file  Tobacco Use  . Smoking status: Current Every Day Smoker    Packs/day: 0.50    Types: Cigarettes  . Smokeless tobacco: Never Used  Substance and Sexual Activity  . Alcohol use: Yes    Alcohol/week: 3.0 - 5.0 standard drinks    Types: 3 - 5 Glasses of wine per week    Comment: 3-5 wine per week per pt  . Drug use: No  . Sexual activity: Not on file

## 2018-09-30 ENCOUNTER — Encounter (INDEPENDENT_AMBULATORY_CARE_PROVIDER_SITE_OTHER): Payer: Self-pay | Admitting: Orthopaedic Surgery

## 2018-10-01 ENCOUNTER — Encounter (INDEPENDENT_AMBULATORY_CARE_PROVIDER_SITE_OTHER): Payer: Self-pay | Admitting: Orthopaedic Surgery

## 2018-10-02 ENCOUNTER — Encounter (INDEPENDENT_AMBULATORY_CARE_PROVIDER_SITE_OTHER): Payer: Self-pay | Admitting: Orthopaedic Surgery

## 2018-10-04 DIAGNOSIS — L821 Other seborrheic keratosis: Secondary | ICD-10-CM | POA: Diagnosis not present

## 2018-10-04 NOTE — Progress Notes (Signed)
Please place orders in Epic as patient has a pre-op appointment on 10/08/2018! Thank you!

## 2018-10-07 ENCOUNTER — Other Ambulatory Visit (INDEPENDENT_AMBULATORY_CARE_PROVIDER_SITE_OTHER): Payer: Self-pay | Admitting: Physician Assistant

## 2018-10-07 NOTE — Progress Notes (Signed)
Please place orders in epic pt. Has a preop 10-08-18 @1030  am Thank you!

## 2018-10-07 NOTE — Patient Instructions (Signed)
Kristin Benjamin  10/07/2018   Your procedure is scheduled on: 10-18-18  Report to [facility] Main  Entrance  Report to admitting at      0530 AM    Call this number if you have problems the morning of surgery [phone]    Remember: Do not eat food or drink liquids :After Midnight.  BRUSH YOUR TEETH MORNING OF SURGERY AND RINSE YOUR MOUTH OUT, NO CHEWING GUM CANDY OR MINTS.     Take these medicines the morning of surgery with A SIP OF WATER: zoloft lipitor, adderall                                You may not have any metal on your body including hair pins and              piercings  Do not wear jewelry, make-up, lotions, powders or perfumes, deodorant             Do not wear nail polish.  Do not shave  48 hours prior to surgery.                Do not bring valuables to the hospital. [place].  Contacts, dentures or bridgework may not be worn into surgery.  Leave suitcase in the car. After surgery it may be brought to your room.               Please read over the following fact sheets you were given: _____________________________________________________________________          Kristin Benjamin - Preparing for Surgery Before surgery, you can play an important role.  Because skin is not sterile, your skin needs to be as free of germs as possible.  You can reduce the number of germs on your skin by washing with CHG (chlorahexidine gluconate) soap before surgery.  CHG is an antiseptic cleaner which kills germs and bonds with the skin to continue killing germs even after washing. Please DO NOT use if you have an allergy to CHG or antibacterial soaps.  If your skin becomes reddened/irritated stop using the CHG and inform your nurse when you arrive at Short Stay. Do not shave (including legs and underarms) for at least 48 hours prior to the first CHG shower.  You may shave your face/neck. Please follow these  instructions carefully:  1.  Shower with CHG Soap the night before surgery and the  morning of Surgery.  2.  If you choose to wash your hair, wash your hair first as usual with your  normal  shampoo.  3.  After you shampoo, rinse your hair and body thoroughly to remove the  shampoo.                           4.  Use CHG as you would any other liquid soap.  You can apply chg directly  to the skin and wash                       Gently with a scrungie or clean washcloth.  5.  Apply the CHG Soap to your body ONLY FROM THE NECK DOWN.   Do not  use on face/ open                           Wound or open sores. Avoid contact with eyes, ears mouth and genitals (private parts).                       Wash face,  Genitals (private parts) with your normal soap.             6.  Wash thoroughly, paying special attention to the area where your surgery  will be performed.  7.  Thoroughly rinse your body with warm water from the neck down.  8.  DO NOT shower/wash with your normal soap after using and rinsing off  the CHG Soap.                9.  Pat yourself dry with a clean towel.            10.  Wear clean pajamas.            11.  Place clean sheets on your bed the night of your first shower and do not  sleep with pets. Day of Surgery : Do not apply any lotions/deodorants the morning of surgery.  Please wear clean clothes to the hospital/surgery center.  FAILURE TO FOLLOW THESE INSTRUCTIONS MAY RESULT IN THE CANCELLATION OF YOUR SURGERY PATIENT SIGNATURE_________________________________  NURSE SIGNATURE__________________________________  ________________________________________________________________________   Kristin Benjamin  An incentive spirometer is a tool that can help keep your lungs clear and active. This tool measures how well you are filling your lungs with each breath. Taking long deep breaths may help reverse or decrease the chance of developing breathing (pulmonary) problems (especially  infection) following:  A long period of time when you are unable to move or be active. BEFORE THE PROCEDURE   If the spirometer includes an indicator to show your best effort, your nurse or respiratory therapist will set it to a desired goal.  If possible, sit up straight or lean slightly forward. Try not to slouch.  Hold the incentive spirometer in an upright position. INSTRUCTIONS FOR USE  1. Sit on the edge of your bed if possible, or sit up as far as you can in bed or on a chair. 2. Hold the incentive spirometer in an upright position. 3. Breathe out normally. 4. Place the mouthpiece in your mouth and seal your lips tightly around it. 5. Breathe in slowly and as deeply as possible, raising the piston or the ball toward the top of the column. 6. Hold your breath for 3-5 seconds or for as long as possible. Allow the piston or ball to fall to the bottom of the column. 7. Remove the mouthpiece from your mouth and breathe out normally. 8. Rest for a few seconds and repeat Steps 1 through 7 at least 10 times every 1-2 hours when you are awake. Take your time and take a few normal breaths between deep breaths. 9. The spirometer may include an indicator to show your best effort. Use the indicator as a goal to work toward during each repetition. 10. After each set of 10 deep breaths, practice coughing to be sure your lungs are clear. If you have an incision (the cut made at the time of surgery), support your incision when coughing by placing a pillow or rolled up towels firmly against it. Once you are able to get out of bed, walk  around indoors and cough well. You may stop using the incentive spirometer when instructed by your caregiver.  RISKS AND COMPLICATIONS  Take your time so you do not get dizzy or light-headed.  If you are in pain, you may need to take or ask for pain medication before doing incentive spirometry. It is harder to take a deep breath if you are having pain. AFTER  USE  Rest and breathe slowly and easily.  It can be helpful to keep track of a log of your progress. Your caregiver can provide you with a simple table to help with this. If you are using the spirometer at home, follow these instructions: [place] IF:   You are having difficultly using the spirometer.  You have trouble using the spirometer as often as instructed.  Your pain medication is not giving enough relief while using the spirometer.  You develop fever of 100.5 F (38.1 C) or higher. SEEK IMMEDIATE MEDICAL CARE IF:   You cough up bloody sputum that had not been present before.  You develop fever of 102 F (38.9 C) or greater.  You develop worsening pain at or near the incision site. MAKE SURE YOU:   Understand these instructions.  Will watch your condition.  Will get help right away if you are not doing well or get worse. Document Released: 03/12/2007 Document Revised: 01/22/2012 Document Reviewed: 05/13/2007 [facility] Patient Information 2014 [place], [place].   ________________________________________________________________________

## 2018-10-08 ENCOUNTER — Encounter (HOSPITAL_COMMUNITY)
Admission: RE | Admit: 2018-10-08 | Discharge: 2018-10-08 | Disposition: A | Discharge status: Home / Self Care | Payer: BLUE CROSS/BLUE SHIELD | Source: Ambulatory Visit | Attending: Orthopaedic Surgery | Admitting: Orthopaedic Surgery

## 2018-10-08 ENCOUNTER — Encounter (HOSPITAL_COMMUNITY): Payer: Self-pay

## 2018-10-08 ENCOUNTER — Other Ambulatory Visit: Payer: Self-pay

## 2018-10-08 DIAGNOSIS — Z01818 Encounter for other preprocedural examination: Secondary | ICD-10-CM | POA: Diagnosis not present

## 2018-10-08 HISTORY — DX: Headache, unspecified: R51.9

## 2018-10-08 HISTORY — DX: Headache: R51

## 2018-10-08 HISTORY — DX: Cardiac murmur, unspecified: R01.1

## 2018-10-08 HISTORY — DX: Unspecified osteoarthritis, unspecified site: M19.90

## 2018-10-08 LAB — BASIC METABOLIC PANEL
Anion gap: 9 (ref 5–15)
BUN: 20 mg/dL (ref 6–20)
CO2: 27 mmol/L (ref 22–32)
Calcium: 9.2 mg/dL (ref 8.9–10.3)
Chloride: 104 mmol/L (ref 98–111)
Creatinine, Ser: 0.66 mg/dL (ref 0.44–1.00)
GFR calc Af Amer: >60 mL/min (ref >60)
GFR calc non Af Amer: >60 mL/min (ref >60)
Glucose, Bld: 102 mg/dL — ABNORMAL HIGH (ref 70–99)
Potassium: 4 mmol/L (ref 3.5–5.1)
Sodium: 140 mmol/L (ref 135–145)

## 2018-10-08 LAB — CBC
HCT: 44.4 % (ref 36.0–46.0)
Hemoglobin: 14.4 g/dL (ref 12.0–15.0)
MCH: 32.4 pg (ref 26.0–34.0)
MCHC: 32.4 g/dL (ref 30.0–36.0)
MCV: 99.8 fL (ref 80.0–100.0)
Platelets: 318 10*3/uL (ref 150–400)
RBC: 4.45 MIL/uL (ref 3.87–5.11)
RDW: 11.8 % (ref 11.5–15.5)
WBC: 6.9 10*3/uL (ref 4.0–10.5)
nRBC: 0 % (ref 0.0–0.2)

## 2018-10-08 LAB — SURGICAL PCR SCREEN
MRSA, PCR: NEGATIVE
Staphylococcus aureus: POSITIVE — AB

## 2018-10-08 NOTE — Progress Notes (Signed)
cxr 12-27-17 epic

## 2018-10-09 ENCOUNTER — Encounter (INDEPENDENT_AMBULATORY_CARE_PROVIDER_SITE_OTHER): Payer: Self-pay | Admitting: Orthopaedic Surgery

## 2018-10-15 ENCOUNTER — Other Ambulatory Visit (INDEPENDENT_AMBULATORY_CARE_PROVIDER_SITE_OTHER): Payer: Self-pay

## 2018-10-18 ENCOUNTER — Inpatient Hospital Stay (HOSPITAL_COMMUNITY)
Admission: RE | Admit: 2018-10-18 | Discharge: 2018-10-20 | DRG: 470 | Disposition: A | Discharge status: Home Health | Payer: BLUE CROSS/BLUE SHIELD | Attending: Orthopaedic Surgery | Admitting: Orthopaedic Surgery

## 2018-10-18 ENCOUNTER — Inpatient Hospital Stay (HOSPITAL_COMMUNITY): Payer: BLUE CROSS/BLUE SHIELD

## 2018-10-18 ENCOUNTER — Inpatient Hospital Stay (HOSPITAL_COMMUNITY): Payer: BLUE CROSS/BLUE SHIELD | Admitting: Anesthesiology

## 2018-10-18 ENCOUNTER — Other Ambulatory Visit: Payer: Self-pay

## 2018-10-18 ENCOUNTER — Encounter (HOSPITAL_COMMUNITY): Payer: Self-pay | Admitting: *Deleted

## 2018-10-18 ENCOUNTER — Encounter (HOSPITAL_COMMUNITY)
Admission: RE | Disposition: A | Discharge status: Home Health | Payer: Self-pay | Source: Home / Self Care | Attending: Orthopaedic Surgery

## 2018-10-18 DIAGNOSIS — Z79899 Other long term (current) drug therapy: Secondary | ICD-10-CM | POA: Diagnosis not present

## 2018-10-18 DIAGNOSIS — Z9851 Tubal ligation status: Secondary | ICD-10-CM | POA: Diagnosis not present

## 2018-10-18 DIAGNOSIS — I10 Essential (primary) hypertension: Secondary | ICD-10-CM | POA: Diagnosis present

## 2018-10-18 DIAGNOSIS — Z471 Aftercare following joint replacement surgery: Secondary | ICD-10-CM | POA: Diagnosis not present

## 2018-10-18 DIAGNOSIS — Z86010 Personal history of colonic polyps: Secondary | ICD-10-CM | POA: Diagnosis not present

## 2018-10-18 DIAGNOSIS — Z96652 Presence of left artificial knee joint: Secondary | ICD-10-CM | POA: Diagnosis not present

## 2018-10-18 DIAGNOSIS — M1712 Unilateral primary osteoarthritis, left knee: Secondary | ICD-10-CM | POA: Diagnosis not present

## 2018-10-18 DIAGNOSIS — F329 Major depressive disorder, single episode, unspecified: Secondary | ICD-10-CM | POA: Diagnosis not present

## 2018-10-18 DIAGNOSIS — F17210 Nicotine dependence, cigarettes, uncomplicated: Secondary | ICD-10-CM | POA: Diagnosis present

## 2018-10-18 DIAGNOSIS — E785 Hyperlipidemia, unspecified: Secondary | ICD-10-CM | POA: Diagnosis present

## 2018-10-18 DIAGNOSIS — M170 Bilateral primary osteoarthritis of knee: Secondary | ICD-10-CM | POA: Diagnosis not present

## 2018-10-18 DIAGNOSIS — G8918 Other acute postprocedural pain: Secondary | ICD-10-CM | POA: Diagnosis not present

## 2018-10-18 HISTORY — PX: TOTAL KNEE ARTHROPLASTY: SHX125

## 2018-10-18 SURGERY — ARTHROPLASTY, KNEE, TOTAL
Anesthesia: Spinal | Site: Knee | Laterality: Left

## 2018-10-18 MED ORDER — LACTATED RINGERS IV SOLN
INTRAVENOUS | Status: DC
  Administered 2018-10-18 (×2): via INTRAVENOUS

## 2018-10-18 MED ORDER — PROMETHAZINE HCL 25 MG/ML IJ SOLN: 6.2500 mg | INTRAMUSCULAR | Status: DC | PRN

## 2018-10-18 MED ORDER — METHOCARBAMOL 500 MG PO TABS
500.0000 mg | ORAL_TABLET | Freq: Four times a day (QID) | ORAL | Status: DC | PRN
  Administered 2018-10-18 – 2018-10-20 (×7): 500 mg via ORAL
  Filled 2018-10-18 (×7): qty 1

## 2018-10-18 MED ORDER — MIDAZOLAM HCL 5 MG/5ML IJ SOLN
INTRAMUSCULAR | Status: DC | PRN
  Administered 2018-10-18: 2 mg via INTRAVENOUS

## 2018-10-18 MED ORDER — PHENOL 1.4 % MT LIQD: 1.0000 | OROMUCOSAL | Status: DC | PRN

## 2018-10-18 MED ORDER — PROPOFOL 10 MG/ML IV BOLUS
INTRAVENOUS | Status: AC
  Filled 2018-10-18: qty 20

## 2018-10-18 MED ORDER — FENTANYL CITRATE (PF) 100 MCG/2ML IJ SOLN
INTRAMUSCULAR | Status: AC
  Filled 2018-10-18: qty 2

## 2018-10-18 MED ORDER — SODIUM CHLORIDE 0.9 % IR SOLN
Status: DC | PRN
  Administered 2018-10-18 (×2): 1000 mL

## 2018-10-18 MED ORDER — METOCLOPRAMIDE HCL 5 MG PO TABS: 5.0000 mg | ORAL_TABLET | Freq: Three times a day (TID) | ORAL | Status: DC | PRN

## 2018-10-18 MED ORDER — ALUM & MAG HYDROXIDE-SIMETH 200-200-20 MG/5ML PO SUSP: 30.0000 mL | ORAL | Status: DC | PRN

## 2018-10-18 MED ORDER — PANTOPRAZOLE SODIUM 40 MG PO TBEC
40.0000 mg | DELAYED_RELEASE_TABLET | Freq: Every day | ORAL | Status: DC
  Administered 2018-10-18 – 2018-10-20 (×3): 40 mg via ORAL
  Filled 2018-10-18 (×2): qty 1

## 2018-10-18 MED ORDER — HYDROMORPHONE HCL 1 MG/ML IJ SOLN: 0.5000 mg | INTRAMUSCULAR | Status: DC | PRN

## 2018-10-18 MED ORDER — SODIUM CHLORIDE 0.9 % IV SOLN
INTRAVENOUS | Status: DC | PRN
  Administered 2018-10-18: 100 ug/min via INTRAVENOUS

## 2018-10-18 MED ORDER — DOCUSATE SODIUM 100 MG PO CAPS
100.0000 mg | ORAL_CAPSULE | Freq: Two times a day (BID) | ORAL | Status: DC
  Administered 2018-10-18 – 2018-10-20 (×5): 100 mg via ORAL
  Filled 2018-10-18 (×5): qty 1

## 2018-10-18 MED ORDER — FENTANYL CITRATE (PF) 100 MCG/2ML IJ SOLN
INTRAMUSCULAR | Status: DC | PRN
  Administered 2018-10-18 (×2): 50 ug via INTRAVENOUS

## 2018-10-18 MED ORDER — CEFAZOLIN SODIUM-DEXTROSE 1-4 GM/50ML-% IV SOLN
1.0000 g | Freq: Four times a day (QID) | INTRAVENOUS | Status: AC
  Administered 2018-10-18 (×2): 1 g via INTRAVENOUS
  Filled 2018-10-18 (×2): qty 50

## 2018-10-18 MED ORDER — FLUTICASONE PROPIONATE 50 MCG/ACT NA SUSP: 2.0000 | Freq: Every day | NASAL | Status: DC | PRN

## 2018-10-18 MED ORDER — ROPIVACAINE HCL 7.5 MG/ML IJ SOLN
INTRAMUSCULAR | Status: DC | PRN
  Administered 2018-10-18: 20 mL via PERINEURAL

## 2018-10-18 MED ORDER — MIDAZOLAM HCL 2 MG/2ML IJ SOLN
INTRAMUSCULAR | Status: AC
  Filled 2018-10-18: qty 2

## 2018-10-18 MED ORDER — METHOCARBAMOL 500 MG IVPB - SIMPLE MED
500.0000 mg | Freq: Four times a day (QID) | INTRAVENOUS | Status: DC | PRN
  Administered 2018-10-18: 500 mg via INTRAVENOUS
  Filled 2018-10-18: qty 50

## 2018-10-18 MED ORDER — LOSARTAN POTASSIUM-HCTZ 100-12.5 MG PO TABS: 1.0000 | ORAL_TABLET | Freq: Every day | ORAL | Status: DC

## 2018-10-18 MED ORDER — MENTHOL 3 MG MT LOZG: 1.0000 | LOZENGE | OROMUCOSAL | Status: DC | PRN

## 2018-10-18 MED ORDER — TRANEXAMIC ACID-NACL 1000-0.7 MG/100ML-% IV SOLN
1000.0000 mg | INTRAVENOUS | Status: AC
  Administered 2018-10-18: 1000 mg via INTRAVENOUS
  Filled 2018-10-18: qty 100

## 2018-10-18 MED ORDER — ONDANSETRON HCL 4 MG/2ML IJ SOLN
INTRAMUSCULAR | Status: AC
  Filled 2018-10-18: qty 2

## 2018-10-18 MED ORDER — LOSARTAN POTASSIUM 50 MG PO TABS
100.0000 mg | ORAL_TABLET | Freq: Every day | ORAL | Status: DC
  Administered 2018-10-19: 100 mg via ORAL
  Filled 2018-10-18: qty 2

## 2018-10-18 MED ORDER — PROPOFOL 10 MG/ML IV BOLUS
INTRAVENOUS | Status: AC
  Filled 2018-10-18: qty 40

## 2018-10-18 MED ORDER — DIPHENHYDRAMINE HCL 12.5 MG/5ML PO ELIX: 12.5000 mg | ORAL_SOLUTION | ORAL | Status: DC | PRN

## 2018-10-18 MED ORDER — HYDROMORPHONE HCL 1 MG/ML IJ SOLN
INTRAMUSCULAR | Status: AC
  Administered 2018-10-18: 0.5 mg via INTRAVENOUS
  Filled 2018-10-18: qty 1

## 2018-10-18 MED ORDER — CLONIDINE HCL (ANALGESIA) 100 MCG/ML EP SOLN
EPIDURAL | Status: DC | PRN
  Administered 2018-10-18: 50 ug

## 2018-10-18 MED ORDER — DEXAMETHASONE SODIUM PHOSPHATE 10 MG/ML IJ SOLN
INTRAMUSCULAR | Status: AC
  Filled 2018-10-18: qty 1

## 2018-10-18 MED ORDER — ZOLPIDEM TARTRATE 5 MG PO TABS: 5.0000 mg | ORAL_TABLET | Freq: Every evening | ORAL | Status: DC | PRN

## 2018-10-18 MED ORDER — ATORVASTATIN CALCIUM 20 MG PO TABS
20.0000 mg | ORAL_TABLET | Freq: Every day | ORAL | Status: DC
  Administered 2018-10-18 – 2018-10-20 (×3): 20 mg via ORAL
  Filled 2018-10-18 (×3): qty 1

## 2018-10-18 MED ORDER — DEXAMETHASONE SODIUM PHOSPHATE 10 MG/ML IJ SOLN
INTRAMUSCULAR | Status: DC | PRN
  Administered 2018-10-18: 4 mg via INTRAVENOUS

## 2018-10-18 MED ORDER — HYDROMORPHONE HCL 1 MG/ML IJ SOLN
0.2500 mg | INTRAMUSCULAR | Status: DC | PRN
  Administered 2018-10-18 (×4): 0.5 mg via INTRAVENOUS

## 2018-10-18 MED ORDER — SERTRALINE HCL 50 MG PO TABS
50.0000 mg | ORAL_TABLET | Freq: Every day | ORAL | Status: DC
  Administered 2018-10-18 – 2018-10-20 (×3): 50 mg via ORAL
  Filled 2018-10-18 (×3): qty 1

## 2018-10-18 MED ORDER — HYDROCHLOROTHIAZIDE 12.5 MG PO CAPS
12.5000 mg | ORAL_CAPSULE | Freq: Every day | ORAL | Status: DC
  Administered 2018-10-19: 12.5 mg via ORAL
  Filled 2018-10-18: qty 1

## 2018-10-18 MED ORDER — METOCLOPRAMIDE HCL 5 MG/ML IJ SOLN: 5.0000 mg | Freq: Three times a day (TID) | INTRAMUSCULAR | Status: DC | PRN

## 2018-10-18 MED ORDER — PHENYLEPHRINE HCL 10 MG/ML IJ SOLN
INTRAMUSCULAR | Status: DC | PRN
  Administered 2018-10-18 (×6): 100 ug via INTRAVENOUS

## 2018-10-18 MED ORDER — AMPHETAMINE-DEXTROAMPHETAMINE 10 MG PO TABS
5.0000 mg | ORAL_TABLET | Freq: Every day | ORAL | Status: DC
  Filled 2018-10-18 (×3): qty 1

## 2018-10-18 MED ORDER — STERILE WATER FOR IRRIGATION IR SOLN
Status: DC | PRN
  Administered 2018-10-18: 2000 mL

## 2018-10-18 MED ORDER — PROPOFOL 500 MG/50ML IV EMUL
INTRAVENOUS | Status: DC | PRN
  Administered 2018-10-18: 125 ug/kg/min via INTRAVENOUS

## 2018-10-18 MED ORDER — PHENYLEPHRINE 40 MCG/ML (10ML) SYRINGE FOR IV PUSH (FOR BLOOD PRESSURE SUPPORT)
PREFILLED_SYRINGE | INTRAVENOUS | Status: AC
  Filled 2018-10-18: qty 20

## 2018-10-18 MED ORDER — ONDANSETRON HCL 4 MG/2ML IJ SOLN: 4.0000 mg | Freq: Four times a day (QID) | INTRAMUSCULAR | Status: DC | PRN

## 2018-10-18 MED ORDER — ONDANSETRON HCL 4 MG PO TABS: 4.0000 mg | ORAL_TABLET | Freq: Four times a day (QID) | ORAL | Status: DC | PRN

## 2018-10-18 MED ORDER — CHLORHEXIDINE GLUCONATE 4 % EX LIQD: 60.0000 mL | Freq: Once | CUTANEOUS | Status: DC

## 2018-10-18 MED ORDER — KETOROLAC TROMETHAMINE 30 MG/ML IJ SOLN: 30.0000 mg | Freq: Once | INTRAMUSCULAR | Status: DC | PRN

## 2018-10-18 MED ORDER — POLYETHYLENE GLYCOL 3350 17 G PO PACK: 17.0000 g | PACK | Freq: Every day | ORAL | Status: DC | PRN

## 2018-10-18 MED ORDER — ONDANSETRON HCL 4 MG/2ML IJ SOLN
INTRAMUSCULAR | Status: DC | PRN
  Administered 2018-10-18: 4 mg via INTRAVENOUS

## 2018-10-18 MED ORDER — CEFAZOLIN SODIUM-DEXTROSE 2-4 GM/100ML-% IV SOLN
2.0000 g | INTRAVENOUS | Status: AC
  Administered 2018-10-18: 2 g via INTRAVENOUS
  Filled 2018-10-18: qty 100

## 2018-10-18 MED ORDER — OXYCODONE HCL 5 MG PO TABS
10.0000 mg | ORAL_TABLET | ORAL | Status: DC | PRN
  Administered 2018-10-18 – 2018-10-20 (×13): 15 mg via ORAL
  Filled 2018-10-18: qty 2
  Filled 2018-10-18 (×13): qty 3

## 2018-10-18 MED ORDER — KETOROLAC TROMETHAMINE 15 MG/ML IJ SOLN
15.0000 mg | Freq: Four times a day (QID) | INTRAMUSCULAR | Status: DC
  Administered 2018-10-18 – 2018-10-20 (×7): 15 mg via INTRAVENOUS
  Filled 2018-10-18 (×8): qty 1

## 2018-10-18 MED ORDER — ACETAMINOPHEN 325 MG PO TABS
325.0000 mg | ORAL_TABLET | Freq: Four times a day (QID) | ORAL | Status: DC | PRN
  Administered 2018-10-18 – 2018-10-19 (×5): 650 mg via ORAL
  Filled 2018-10-18 (×5): qty 2

## 2018-10-18 MED ORDER — OXYCODONE HCL 5 MG PO TABS
5.0000 mg | ORAL_TABLET | ORAL | Status: DC | PRN
  Administered 2018-10-18: 5 mg via ORAL
  Administered 2018-10-18: 10 mg via ORAL
  Filled 2018-10-18: qty 1

## 2018-10-18 MED ORDER — ASPIRIN EC 325 MG PO TBEC
325.0000 mg | DELAYED_RELEASE_TABLET | Freq: Two times a day (BID) | ORAL | Status: DC
  Administered 2018-10-18 – 2018-10-20 (×4): 325 mg via ORAL
  Filled 2018-10-18 (×4): qty 1

## 2018-10-18 MED ORDER — CLONAZEPAM 0.5 MG PO TABS
0.5000 mg | ORAL_TABLET | Freq: Every day | ORAL | Status: DC | PRN
  Administered 2018-10-18 – 2018-10-19 (×2): 0.5 mg via ORAL
  Filled 2018-10-18 (×2): qty 1

## 2018-10-18 MED ORDER — METHOCARBAMOL 500 MG IVPB - SIMPLE MED
INTRAVENOUS | Status: AC
  Administered 2018-10-18: 500 mg via INTRAVENOUS
  Filled 2018-10-18: qty 50

## 2018-10-18 MED ORDER — SODIUM CHLORIDE 0.9 % IV SOLN
INTRAVENOUS | Status: DC
  Administered 2018-10-18 – 2018-10-19 (×2): via INTRAVENOUS

## 2018-10-18 SURGICAL SUPPLY — 63 items
APL SKNCLS STERI-STRIP NONHPOA (GAUZE/BANDAGES/DRESSINGS)
BAG SPEC THK2 15X12 ZIP CLS (MISCELLANEOUS)
BAG ZIPLOCK 12X15 (MISCELLANEOUS) IMPLANT
BANDAGE ACE 6X5 VEL STRL LF (GAUZE/BANDAGES/DRESSINGS) ×3 IMPLANT
BEARIN INSERT TIBIAL SZ 3 11 (Insert) ×3 IMPLANT
BEARING INSERT TIBIAL SZ 3 11 (Insert) IMPLANT
BENZOIN TINCTURE PRP APPL 2/3 (GAUZE/BANDAGES/DRESSINGS) IMPLANT
BLADE SAG 18X100X1.27 (BLADE) IMPLANT
BLADE SURG SZ10 CARB STEEL (BLADE) ×6 IMPLANT
BNDG CMPR MED 10X6 ELC LF (GAUZE/BANDAGES/DRESSINGS) ×1
BNDG ELASTIC 6X10 VLCR STRL LF (GAUZE/BANDAGES/DRESSINGS) ×2 IMPLANT
BOWL SMART MIX CTS (DISPOSABLE) IMPLANT
BSPLAT TIB 3 KN TRITANIUM (Knees) ×1 IMPLANT
CLOSURE STERI-STRIP 1/2X4 (GAUZE/BANDAGES/DRESSINGS) ×1
CLOSURE WOUND 1/2 X4 (GAUZE/BANDAGES/DRESSINGS)
CLSR STERI-STRIP ANTIMIC 1/2X4 (GAUZE/BANDAGES/DRESSINGS) ×1 IMPLANT
COVER SURGICAL LIGHT HANDLE (MISCELLANEOUS) ×3 IMPLANT
COVER WAND RF STERILE (DRAPES) ×2 IMPLANT
CUFF TOURN SGL QUICK 34 (TOURNIQUET CUFF) ×3
CUFF TRNQT CYL 34X4X40X1 (TOURNIQUET CUFF) ×1 IMPLANT
DECANTER SPIKE VIAL GLASS SM (MISCELLANEOUS) IMPLANT
DRAPE U-SHAPE 47X51 STRL (DRAPES) ×3 IMPLANT
DRSG PAD ABDOMINAL 8X10 ST (GAUZE/BANDAGES/DRESSINGS) ×3 IMPLANT
DURAPREP 26ML APPLICATOR (WOUND CARE) ×3 IMPLANT
ELECT REM PT RETURN 15FT ADLT (MISCELLANEOUS) ×3 IMPLANT
FEMORAL TRIATH POST STAB  SZ3 (Orthopedic Implant) ×2 IMPLANT
FEMORAL TRIATH POST STAB SZ3 (Orthopedic Implant) IMPLANT
GAUZE SPONGE 4X4 12PLY STRL (GAUZE/BANDAGES/DRESSINGS) ×3 IMPLANT
GAUZE XEROFORM 1X8 LF (GAUZE/BANDAGES/DRESSINGS) IMPLANT
GLOVE BIO SURGEON STRL SZ7.5 (GLOVE) ×3 IMPLANT
GLOVE BIOGEL PI IND STRL 8 (GLOVE) ×2 IMPLANT
GLOVE BIOGEL PI INDICATOR 8 (GLOVE) ×4
GLOVE ECLIPSE 8.0 STRL XLNG CF (GLOVE) ×3 IMPLANT
GOWN STRL REUS W/TWL XL LVL3 (GOWN DISPOSABLE) ×6 IMPLANT
HANDPIECE INTERPULSE COAX TIP (DISPOSABLE) ×3
HOLDER FOLEY CATH W/STRAP (MISCELLANEOUS) ×2 IMPLANT
IMMOBILIZER KNEE 20 (SOFTGOODS) ×5 IMPLANT
IMMOBILIZER KNEE 20 THIGH 36 (SOFTGOODS) ×1 IMPLANT
INSERT TIB TRIATH 12 S3 (Insert) ×2 IMPLANT
KNEE PATELLA ASYMMETRIC 10X32 (Knees) ×2 IMPLANT
KNEE TIBIAL COMPONENT SZ3 (Knees) ×2 IMPLANT
NDL SAFETY ECLIPSE 18X1.5 (NEEDLE) IMPLANT
NEEDLE HYPO 18GX1.5 SHARP (NEEDLE)
NS IRRIG 1000ML POUR BTL (IV SOLUTION) ×3 IMPLANT
PACK TOTAL KNEE CUSTOM (KITS) ×3 IMPLANT
PAD ABD 7.5X8 STRL (GAUZE/BANDAGES/DRESSINGS) ×2 IMPLANT
PADDING CAST COTTON 6X4 STRL (CAST SUPPLIES) ×4 IMPLANT
PROTECTOR NERVE ULNAR (MISCELLANEOUS) ×3 IMPLANT
SET HNDPC FAN SPRY TIP SCT (DISPOSABLE) ×1 IMPLANT
SET PAD KNEE POSITIONER (MISCELLANEOUS) ×3 IMPLANT
STRIP CLOSURE SKIN 1/2X4 (GAUZE/BANDAGES/DRESSINGS) IMPLANT
SUT MNCRL AB 4-0 PS2 18 (SUTURE) IMPLANT
SUT VIC AB 0 CT1 27 (SUTURE) ×3
SUT VIC AB 0 CT1 27XBRD ANTBC (SUTURE) ×1 IMPLANT
SUT VIC AB 1 CT1 36 (SUTURE) ×6 IMPLANT
SUT VIC AB 2-0 CT1 27 (SUTURE) ×6
SUT VIC AB 2-0 CT1 TAPERPNT 27 (SUTURE) ×2 IMPLANT
SYR 3ML LL SCALE MARK (SYRINGE) IMPLANT
TRAY FOLEY MTR SLVR 16FR STAT (SET/KITS/TRAYS/PACK) ×3 IMPLANT
Triathalon tibial bearing insert ps size 3, 12mm (Knees) ×1 IMPLANT
WATER STERILE IRR 1000ML POUR (IV SOLUTION) ×3 IMPLANT
WRAP KNEE MAXI GEL POST OP (GAUZE/BANDAGES/DRESSINGS) ×3 IMPLANT
YANKAUER SUCT BULB TIP 10FT TU (MISCELLANEOUS) ×3 IMPLANT

## 2018-10-18 NOTE — Plan of Care (Signed)

## 2018-10-18 NOTE — Brief Op Note (Signed)
10/18/2018  8:29 AM  PATIENT:  Kristin Benjamin  62 y.o. female  PRE-OPERATIVE DIAGNOSIS:  left knee osteoarthritis  POST-OPERATIVE DIAGNOSIS:  left knee osteoarthritis  PROCEDURE:  Procedure(s): LEFT TOTAL KNEE ARTHROPLASTY (Left)  SURGEON:  Surgeon(s) and Role:    Mcarthur Rossetti, MD - Primary  PHYSICIAN ASSISTANT: Benita Stabile, PA-C  ANESTHESIA:   regional and spinal  EBL:  50 mL   COUNTS:  YES  TOURNIQUET:   Total Tourniquet Time Documented: Thigh (Left) - 37 minutes Total: Thigh (Left) - 37 minutes   DICTATION: .Other Dictation: Dictation Number [id number]  PLAN OF CARE: Admit to inpatient   PATIENT DISPOSITION:  PACU - hemodynamically stable.   Delay start of Pharmacological VTE agent (>24hrs) due to surgical blood loss or risk of bleeding: no

## 2018-10-18 NOTE — Anesthesia Preprocedure Evaluation (Signed)
Anesthesia Evaluation  Patient identified by MRN, date of birth, ID band Patient awake    Reviewed: Allergy & Precautions, NPO status , Patient's Chart, lab work & pertinent test results  Airway Mallampati: II  TM Distance: >3 FB Neck ROM: Full    Dental no notable dental hx.    Pulmonary Current Smoker,    Pulmonary exam normal breath sounds clear to auscultation       Cardiovascular hypertension, Normal cardiovascular exam Rhythm:Regular Rate:Normal     Neuro/Psych negative neurological ROS  negative psych ROS   GI/Hepatic negative GI ROS, Neg liver ROS,   Endo/Other  negative endocrine ROS  Renal/GU negative Renal ROS  negative genitourinary   Musculoskeletal  (+) Arthritis ,   Abdominal   Peds negative pediatric ROS (+)  Hematology negative hematology ROS (+)   Anesthesia Other Findings   Reproductive/Obstetrics negative OB ROS                             Anesthesia Physical Anesthesia Plan  ASA: II  Anesthesia Plan: Spinal   Post-op Pain Management:    Induction: Intravenous  PONV Risk Score and Plan: 2 and Ondansetron, Dexamethasone and Treatment may vary due to age or medical condition  Airway Management Planned: Simple Face Mask  Additional Equipment:   Intra-op Plan:   Post-operative Plan:   Informed Consent: I have reviewed the patients History and Physical, chart, labs and discussed the procedure including the risks, benefits and alternatives for the proposed anesthesia with the patient or authorized representative who has indicated his/her understanding and acceptance.   Dental advisory given  Plan Discussed with: CRNA and Surgeon  Anesthesia Plan Comments:         Anesthesia Quick Evaluation

## 2018-10-18 NOTE — Anesthesia Procedure Notes (Signed)
Anesthesia Regional Block: Adductor canal block   Pre-Anesthetic Checklist: ,, timeout performed, Correct Patient, Correct Site, Correct Laterality, Correct Procedure, Correct Position, site marked, Risks and benefits discussed,  Surgical consent,  Pre-op evaluation,  At surgeon's request and post-op pain management  Laterality: Left  Prep: chloraprep       Needles:  Injection technique: Single-shot  Needle Type: Echogenic Needle     Needle Length: 9cm      Additional Needles:   Procedures:,,,, ultrasound used (permanent image in chart),,,,  Narrative:  Start time: 10/18/2018 6:51 AM End time: 10/18/2018 7:00 AM Injection made incrementally with aspirations every 5 mL.  Performed by: Personally  Anesthesiologist: Myrtie Soman, MD  Additional Notes: Patient tolerated the procedure well without complications

## 2018-10-18 NOTE — Anesthesia Procedure Notes (Signed)
Spinal  Patient location during procedure: OR Start time: 10/18/2018 7:10 AM End time: 10/18/2018 7:15 AM Staffing Anesthesiologist: Myrtie Soman, MD Performed: anesthesiologist  Preanesthetic Checklist Completed: patient identified, site marked, surgical consent, pre-op evaluation, timeout performed, IV checked, risks and benefits discussed and monitors and equipment checked Spinal Block Patient position: sitting Prep: ChloraPrep Patient monitoring: heart rate, continuous pulse ox and blood pressure Approach: midline Location: L3-4 Injection technique: single-shot Needle Needle type: Sprotte  Needle gauge: 24 G Needle length: 9 cm Additional Notes Expiration date of kit checked and confirmed. Patient tolerated procedure well, without complications.

## 2018-10-18 NOTE — Anesthesia Procedure Notes (Signed)
Anesthesia Procedure Image    

## 2018-10-18 NOTE — H&P (Signed)
TOTAL KNEE ADMISSION H&P  Patient is being admitted for left total knee arthroplasty.  Subjective:  Chief Complaint:left knee pain.  HPI: Kristin Benjamin, 62 y.o. female, has a history of pain and functional disability in the left knee due to arthritis and has failed non-surgical conservative treatments for greater than 12 weeks to includeNSAID's and/or analgesics, corticosteriod injections, viscosupplementation injections, flexibility and strengthening excercises and activity modification.  Onset of symptoms was gradual, starting 5 years ago with gradually worsening course since that time. The patient noted no past surgery on the left knee(s).  Patient currently rates pain in the left knee(s) at 10 out of 10 with activity. Patient has night pain, worsening of pain with activity and weight bearing, pain that interferes with activities of daily living, pain with passive range of motion, crepitus and joint swelling.  Patient has evidence of subchondral sclerosis, periarticular osteophytes and joint space narrowing by imaging studies. There is no active infection.  Patient Active Problem List   Diagnosis Date Noted  . Unilateral primary osteoarthritis, left knee 10/18/2018  . Unilateral primary osteoarthritis, right knee 03/28/2018  . Chronic pain of right knee 03/28/2018  . Hx of adenomatous colonic polyps 07/17/2017   Past Medical History:  Diagnosis Date  . Anxiety   . Arthritis   . Depression   . Headache    history of  . Heart murmur   . Hx of adenomatous colonic polyps 07/17/2017  . Hyperlipidemia   . Hypertension     Past Surgical History:  Procedure Laterality Date  . bladder tack    . COLONOSCOPY  2006   and 2018  . FUNCTIONAL ENDOSCOPIC SINUS SURGERY    . JOINT REPLACEMENT     Left total knee arthroplasty Dr. Ninfa Linden 10-18-18  . KNEE ARTHROSCOPY W/ MENISCAL REPAIR Right 08/2016  . TUBAL LIGATION  1993    Current Facility-Administered Medications  Medication Dose Route  Frequency Provider Last Rate Last Dose  . ceFAZolin (ANCEF) IVPB 2g/100 mL premix  2 g Intravenous On Call to OR Pete Pelt, PA-C      . chlorhexidine (HIBICLENS) 4 % liquid 4 application  60 mL Topical Once Pete Pelt, PA-C      . lactated ringers infusion   Intravenous Continuous Myrtie Soman, MD 50 mL/hr at 10/18/18 [phone]    . tranexamic acid (CYKLOKAPRON) IVPB 1,000 mg  1,000 mg Intravenous To OR Pete Pelt, PA-C       No Known Allergies  Social History   Tobacco Use  . Smoking status: Current Every Day Smoker    Packs/day: 1.00    Types: Cigarettes  . Smokeless tobacco: Never Used  Substance Use Topics  . Alcohol use: Yes    Alcohol/week: 3.0 - 5.0 standard drinks    Types: 3 - 5 Glasses of wine per week    Comment: 3-5 wine per week per pt    Family History  Problem Relation Age of Onset  . Colon cancer Neg Hx   . Esophageal cancer Neg Hx   . Rectal cancer Neg Hx   . Stomach cancer Neg Hx      Review of Systems  Musculoskeletal: Positive for joint pain.  All other systems reviewed and are negative.   Objective:  Physical Exam  Constitutional: She is oriented to person, place, and time. She appears well-developed and well-nourished.  HENT:  Head: Normocephalic and atraumatic.  Eyes: Pupils are equal, round, and reactive to light. EOM are normal.  Neck: Normal range of motion. Neck supple.  Cardiovascular: Normal rate and regular rhythm.  Respiratory: Effort normal and breath sounds normal.  GI: Soft. Bowel sounds are normal.  Musculoskeletal:       Left knee: She exhibits decreased range of motion, effusion, abnormal alignment and bony tenderness. Tenderness found. Medial joint line tenderness noted.  Neurological: She is alert and oriented to person, place, and time.  Skin: Skin is warm and dry.  Psychiatric: She has a normal mood and affect.    Vital signs in last 24 hours: Temp:  [98.5 F (36.9 C)] 98.5 F (36.9 C) (12/06 0611) Pulse  Rate:  [78] 78 (12/06 0611) Resp:  [16] 16 (12/06 0611) BP: (117)/(86) 117/86 (12/06 0611) SpO2:  [94 %] 94 % (12/06 0611) Weight:  [73.9 kg] 73.9 kg (12/06 2202)  Labs:   Estimated body mass index is 24.07 kg/m as calculated from the following:   Height as of this encounter: 5\' 9"  (1.753 m).   Weight as of this encounter: 73.9 kg.   Imaging Review Plain radiographs demonstrate severe degenerative joint disease of the left knee(s). The overall alignment isneutral. The bone quality appears to be excellent for age and reported activity level.   Preoperative templating of the joint replacement has been completed, documented, and submitted to the Operating Room personnel in order to optimize intra-operative equipment management.    Patient's anticipated LOS is less than 2 midnights, meeting these requirements: - Younger than 63 - Lives within 1 hour of care - Has a competent adult at home to recover with post-op recover - NO history of  - Chronic pain requiring opiods  - Diabetes  - Coronary Artery Disease  - Heart failure  - Heart attack  - Stroke  - DVT/VTE  - Cardiac arrhythmia  - Respiratory Failure/COPD  - Renal failure  - Anemia  - Advanced Liver disease        Assessment/Plan:  End stage arthritis, left knee   The patient history, physical examination, clinical judgment of the provider and imaging studies are consistent with end stage degenerative joint disease of the left knee(s) and total knee arthroplasty is deemed medically necessary. The treatment options including medical management, injection therapy arthroscopy and arthroplasty were discussed at length. The risks and benefits of total knee arthroplasty were presented and reviewed. The risks due to aseptic loosening, infection, stiffness, patella tracking problems, thromboembolic complications and other imponderables were discussed. The patient acknowledged the explanation, agreed to proceed with the plan  and consent was signed. Patient is being admitted for inpatient treatment for surgery, pain control, PT, OT, prophylactic antibiotics, VTE prophylaxis, progressive ambulation and ADL's and discharge planning. The patient is planning to be discharged home with home health services

## 2018-10-18 NOTE — Anesthesia Postprocedure Evaluation (Signed)
Anesthesia Post Note  Patient: Kristin Benjamin  Procedure(s) Performed: LEFT TOTAL KNEE ARTHROPLASTY (Left Knee)     Patient location during evaluation: PACU Anesthesia Type: Spinal Level of consciousness: oriented and awake and alert Pain management: pain level controlled Vital Signs Assessment: post-procedure vital signs reviewed and stable Respiratory status: spontaneous breathing, respiratory function stable and patient connected to nasal cannula oxygen Cardiovascular status: blood pressure returned to baseline and stable Postop Assessment: no headache, no backache and no apparent nausea or vomiting Anesthetic complications: no    Last Vitals:  Vitals:   10/18/18 0930 10/18/18 0945  BP: 104/78 106/79  Pulse: 74 77  Resp: 15 16  Temp:  37.1 C  SpO2: 99% 98%    Last Pain:  Vitals:   10/18/18 0945  TempSrc:   PainSc: 4     LLE Motor Response: Purposeful movement (10/18/18 0945) LLE Sensation: Decreased (10/18/18 0945) RLE Motor Response: Purposeful movement (10/18/18 0945) RLE Sensation: Decreased (10/18/18 0945) L Sensory Level: S2-Posterior medial thigh and lower leg (10/18/18 0945) R Sensory Level: S2-Posterior medial thigh and lower leg (10/18/18 0945)  Lucita Montoya S

## 2018-10-18 NOTE — Op Note (Signed)
NAME: Kristin Benjamin, FELIZ MEDICAL RECORD [id number] ACCOUNT [id number] DATE OF BIRTH:Dec 21, 1955 FACILITY: [facility] LOCATION: [facility] PHYSICIAN:Kareen Hitsman Kerry [place], MD  OPERATIVE REPORT  DATE OF PROCEDURE:  10/18/2018  PREOPERATIVE DIAGNOSES:  Primary osteoarthritis and degenerative joint disease, left knee.  POSTOPERATIVE DIAGNOSES:  Primary osteoarthritis and degenerative joint disease, left knee.  PROCEDURE:  Left total knee arthroplasty.  IMPLANTS:  Stryker press-fit Triathlon knee system with size 3 femur, size 3 tibial tray, 12 mm thickness fixed-bearing polyethylene insert, size 32 press-fit patellar button.  SURGEON:  Lind Guest. Ninfa Linden, MD  ASSISTANT:  Erskine Emery, PA-C  ANESTHESIA: 1.  Left lower extremity adductor canal block. 2.  Spinal.  TOURNIQUET TIME:  Less than 1 hour.  ESTIMATED BLOOD LOSS:  Less than 100 mL.  ANTIBIOTICS:  Two grams IV Ancef.  COMPLICATIONS:  None.  INDICATIONS:  The patient is a very pleasant and active 62 year old female well known to me.  She has debilitating arthritis involving her left knee.  This has been confirmed on clinical exam, x-rays, and an MRI of the knee.  Her pain is daily, and she  has tried and failed all forms of conservative treatment.  At this point, her left knee pain has detrimentally affected her mobility, her quality of life, and her activities of daily living.  She does wish to proceed with a total knee arthroplasty, and  we have recommended this to her.  We have explained to her in detail the risk of acute blood loss anemia, nerve or vessel injury, fracture, infection, DVT, and implant failure.  She understands the goals are decreased pain, improved mobility and overall  improved quality of life.  DESCRIPTION OF PROCEDURE:  After informed consent was obtained, appropriate left knee was marked and an adductor canal block was obtained in the holding room.  Then she was brought to the operating room and sat  up on the operating table where spinal  anesthesia was obtained.  She was then laid in the supine position.  A Foley catheter was placed, and a nonsterile tourniquet was placed around her upper left thigh.  Her left thigh, knee, leg, ankle and foot were prepped and draped with DuraPrep and  sterile drapes including a sterile stockinette.  Time-out was called, and she was identified as correct patient, correct left knee.  We then used an Esmarch to wrap that leg, and tourniquet was inflated to 300 mm of pressure.  I then made a direct  midline incision over the patella and carried this proximally and distally.  We dissected down the knee joint and carried out a medial parapatellar arthrotomy, finding a significant joint effusion.  With the knee in a flexed position, we removed remnants  of ACL, PCL, medial and lateral meniscus and found significant cartilage wear in the knee.  We removed periarticular osteophytes.  We started with the tibia first, using an extramedullary cutting guide for making our proximal tibia cut.  We set this  guide for a neutral slope and neutral varus and valgus, taking 9 mm off the high side.  We made this cut without difficulty.  We then went to the femur and used an intramedullary drill for the femur for selecting our distal femoral cut.  We set this for  a right knee at 5 degrees externally rotated for an 8 mm distal femoral cut.  We made this cut without difficulty and brought the knee back down to full extension with a 9 mm extension block.  She was just slightly hyperextended.  We then went back to  the femur and put our femoral sizing guide based off the epicondylar axis.  Based off of that, we chose a size 3 femur.  We put a 4-in-1 cutting block for a size 3 femur, made our anterior and posterior cuts followed by our chamfer cuts, and then we made  our femoral box cut.  Attention was then turned back to the tibia.  We chose a size 3 tibia for tibial coverage, and we set our  rotation off the tibial tubercle and the femur.  We did our press-fit keel punch for this.  We then went to a size 3 trial  tibial tray, followed by a size 3 femur.  We trialed our 11 mm insert.  There was a slight hyperextension.  I felt she was stable with that.  We then drilled 3 holes and made our patellar cut for a press-fit size 32 patellar component.  We then removed  all the trial components from the knee and irrigated the knee with normal saline solution using pulsatile lavage.  We then dried the knee very well and placed our real Stryker Triathlon press-fit size 3 tibial tray followed by the size 3 press-fit femur.   We press-fit our patellar button and placed our 11 mm fixed-bearing polyethylene insert.  Once we placed that, though, I felt like she had settled a little bit of play in varus and valgus stressing, so we removed that polyethylene and went up 1 m size  to a size 12.  That gave her stability that I was much more comfortable with.  We then let the tourniquet down.  Hemostasis was obtained with electrocautery.  We closed the arthrotomy with interrupted #1 Vicryl suture followed by 0 Vicryl in the deep  tissue, 2-0 Vicryl subcutaneous tissue, 4-0 Monocryl subcuticular stitch and Steri-Strips on the skin.  An Aquacel dressing was applied.  A well-padded sterile dressing was applied.  She was taken to the recovery room in stable condition.  All final  counts were correct.  There were no complications noted.  Of note, Benita Stabile, PA-C, assisted the entire case.  His assistance was crucial for facilitating all aspects of this case.  LN/NUANCE  D:10/18/2018 T:10/18/2018 JOB:[id number]

## 2018-10-18 NOTE — Evaluation (Signed)
Physical Therapy Evaluation Patient Details Name: Kristin Benjamin MRN: [id number] DOB: August 26, 1956 Today's Date: 10/18/2018   History of Present Illness  Pt s/p L TKR  Clinical Impression  Pt s/p L TKR and presents with decreased L LE strength/ROM and post op pain limiting functional mobility.  Pt should progress to dc home with family assist and HHPT follow up.    Follow Up Recommendations Follow surgeon's recommendation for DC plan and follow-up therapies    Equipment Recommendations  3in1 (PT)    Recommendations for Other Services       Precautions / Restrictions Precautions Precautions: Knee;Fall Required Braces or Orthoses: Knee Immobilizer - Left Knee Immobilizer - Left: Discontinue once straight leg raise with < 10 degree lag Restrictions Weight Bearing Restrictions: No Other Position/Activity Restrictions: WBAT      Mobility  Bed Mobility Overal bed mobility: Needs Assistance Bed Mobility: Supine to Sit     Supine to sit: Min assist;+2 for physical assistance;+2 for safety/equipment     General bed mobility comments: increased time with cues for sequence and use of R LE to self assist  Transfers                 General transfer comment: NT - pt with increasing dizziness in sitting and returned to supine  Ambulation/Gait                Stairs            Wheelchair Mobility    Modified Rankin (Stroke Patients Only)       Balance                                             Pertinent Vitals/Pain Pain Assessment: 0-10 Pain Score: 7  Pain Location: L knee Pain Descriptors / Indicators: Aching;Sore Pain Intervention(s): Limited activity within patient's tolerance;Monitored during session;Premedicated before session;Ice applied    Home Living Family/patient expects to be discharged to:: Private residence Living Arrangements: Spouse/significant other Available Help at Discharge: Family Type of Home:  House Home Access: Stairs to enter Entrance Stairs-Rails: Right Entrance Stairs-Number of Steps: 4+1 Home Layout: Two level Home Equipment: Environmental consultant - 2 wheels      Prior Function Level of Independence: Independent               Hand Dominance        Extremity/Trunk Assessment   Upper Extremity Assessment Upper Extremity Assessment: Overall WFL for tasks assessed    Lower Extremity Assessment Lower Extremity Assessment: LLE deficits/detail    Cervical / Trunk Assessment Cervical / Trunk Assessment: Normal  Communication   Communication: No difficulties  Cognition Arousal/Alertness: Awake/alert Behavior During Therapy: WFL for tasks assessed/performed Overall Cognitive Status: Within Functional Limits for tasks assessed                                        General Comments      Exercises Total Joint Exercises Ankle Circles/Pumps: AROM;Both;15 reps;Supine   Assessment/Plan    PT Assessment Patient needs continued PT services  PT Problem List Decreased strength;Decreased range of motion;Decreased activity tolerance;Decreased mobility;Decreased knowledge of use of DME;Pain       PT Treatment Interventions DME instruction;Gait training;Stair training;Functional mobility training;Therapeutic activities;Therapeutic exercise;Patient/family education    PT Goals (  Current goals can be found in the Care Plan section)  Acute Rehab PT Goals Patient Stated Goal: Walk with less pain PT Goal Formulation: With patient Time For Goal Achievement: 10/25/18 Potential to Achieve Goals: Good    Frequency 7X/week   Barriers to discharge        Co-evaluation               AM-PAC PT "6 Clicks" Mobility  Outcome Measure Help needed turning from your back to your side while in a flat bed without using bedrails?: A Lot Help needed moving from lying on your back to sitting on the side of a flat bed without using bedrails?: A Lot Help needed moving  to and from a bed to a chair (including a wheelchair)?: A Lot Help needed standing up from a chair using your arms (e.g., wheelchair or bedside chair)?: A Little Help needed to walk in hospital room?: Total Help needed climbing 3-5 steps with a railing? : Total 6 Click Score: 11    End of Session Equipment Utilized During Treatment: Gait belt;Left knee immobilizer       PT Visit Diagnosis: Difficulty in walking, not elsewhere classified (R26.2)    Time: 1529-1600 PT Time Calculation (min) (ACUTE ONLY): 31 min   Charges:   PT Evaluation $PT Eval Low Complexity: 1 Low PT Treatments $Therapeutic Activity: 8-22 mins        Debe Coder PT Acute Rehabilitation Services Pager [phone] Office [phone]   Heber Hoog 10/18/2018, 5:09 PM

## 2018-10-18 NOTE — Transfer of Care (Signed)
Immediate Anesthesia Transfer of Care Note  Patient: Kristin Benjamin  Procedure(s) Performed: LEFT TOTAL KNEE ARTHROPLASTY (Left Knee)  Patient Location: PACU  Anesthesia Type:Spinal  Level of Consciousness: awake, alert  and oriented  Airway & Oxygen Therapy: Patient Spontanous Breathing and Patient connected to face mask oxygen  Post-op Assessment: Report given to RN and Post -op Vital signs reviewed and stable  Post vital signs: Reviewed and stable  Last Vitals:  Vitals Value Taken Time  BP    Temp    Pulse 79 10/18/2018  8:55 AM  Resp 17 10/18/2018  8:55 AM  SpO2 95 % 10/18/2018  8:55 AM  Vitals shown include unvalidated device data.  Last Pain:  Vitals:   10/18/18 6270  TempSrc:   PainSc: 0-No pain      Patients Stated Pain Goal: 4 (35/00/93 8182)  Complications: No apparent anesthesia complications

## 2018-10-19 LAB — BASIC METABOLIC PANEL
Anion gap: 6 (ref 5–15)
BUN: 16 mg/dL (ref 6–20)
CO2: 27 mmol/L (ref 22–32)
Calcium: 8.3 mg/dL — ABNORMAL LOW (ref 8.9–10.3)
Chloride: 106 mmol/L (ref 98–111)
Creatinine, Ser: 0.56 mg/dL (ref 0.44–1.00)
GFR calc Af Amer: >60 mL/min (ref >60)
GFR calc non Af Amer: >60 mL/min (ref >60)
Glucose, Bld: 118 mg/dL — ABNORMAL HIGH (ref 70–99)
Potassium: 3.6 mmol/L (ref 3.5–5.1)
Sodium: 139 mmol/L (ref 135–145)

## 2018-10-19 LAB — CBC
HCT: 36.6 % (ref 36.0–46.0)
Hemoglobin: 11.7 g/dL — ABNORMAL LOW (ref 12.0–15.0)
MCH: 32 pg (ref 26.0–34.0)
MCHC: 32 g/dL (ref 30.0–36.0)
MCV: 100 fL (ref 80.0–100.0)
Platelets: 253 10*3/uL (ref 150–400)
RBC: 3.66 MIL/uL — ABNORMAL LOW (ref 3.87–5.11)
RDW: 11.8 % (ref 11.5–15.5)
WBC: 7.9 10*3/uL (ref 4.0–10.5)
nRBC: 0 % (ref 0.0–0.2)

## 2018-10-19 MED ORDER — METHOCARBAMOL 500 MG PO TABS: 500.0000 mg | ORAL_TABLET | Freq: Four times a day (QID) | ORAL | 0 refills | Status: DC | PRN

## 2018-10-19 MED ORDER — ASPIRIN 325 MG PO TBEC: 325.0000 mg | DELAYED_RELEASE_TABLET | Freq: Two times a day (BID) | ORAL | 0 refills | Status: DC

## 2018-10-19 MED ORDER — OXYCODONE HCL 5 MG PO TABS: 5.0000 mg | ORAL_TABLET | ORAL | 0 refills | Status: DC | PRN

## 2018-10-19 NOTE — Progress Notes (Signed)
Subjective: 1 Day Post-Op Procedure(s) (LRB): LEFT TOTAL KNEE ARTHROPLASTY (Left) Patient reports pain as moderate.    Objective: Vital signs in last 24 hours: Temp:  [97.9 F (36.6 C)-99.3 F (37.4 C)] 98.5 F (36.9 C) (12/07 0518) Pulse Rate:  [75-83] 76 (12/07 0518) Resp:  [12-16] 14 (12/07 0518) BP: (98-115)/(62-84) 98/72 (12/07 0518) SpO2:  [96 %-100 %] 100 % (12/07 0518)  Intake/Output from previous day: 12/06 0701 - 12/07 0700 In: 3887.5 [P.O.:1080; I.V.:2757.5; IV Piggyback:50] Out: 2995 [Urine:2945; Blood:50] Intake/Output this shift: Total I/O In: 120 [P.O.:120] Out: -   Recent Labs    10/19/18 0354  HGB 11.7*   Recent Labs    10/19/18 0354  WBC 7.9  RBC 3.66*  HCT 36.6  PLT 253   Recent Labs    10/19/18 0354  NA 139  K 3.6  CL 106  CO2 27  BUN 16  CREATININE 0.56  GLUCOSE 118*  CALCIUM 8.3*   No results for input(s): LABPT, INR in the last 72 hours.  Sensation intact distally Intact pulses distally Dorsiflexion/Plantar flexion intact Incision: dressing C/D/I No cellulitis present Compartment soft   Assessment/Plan: 1 Day Post-Op Procedure(s) (LRB): LEFT TOTAL KNEE ARTHROPLASTY (Left) Up with therapy Plan for discharge tomorrow Discharge home with home health    Mcarthur Rossetti 10/19/2018, 11:05 AM

## 2018-10-19 NOTE — Progress Notes (Signed)
Physical Therapy Treatment Patient Details Name: Kristin Benjamin MRN: [id number] DOB: [birth year] Today's Date: 10/19/2018    History of Present Illness Pt s/p L TKR    PT Comments    Pt cooperative but progressing slowly 2* c/o pain with therex and dizziness/low BP with attempts to mobilize.  Follow Up Recommendations  Follow surgeon's recommendation for DC plan and follow-up therapies     Equipment Recommendations  3in1 (PT);Rolling walker with 5" wheels    Recommendations for Other Services       Precautions / Restrictions Precautions Precautions: Knee;Fall Required Braces or Orthoses: Knee Immobilizer - Left Knee Immobilizer - Left: Discontinue once straight leg raise with < 10 degree lag Restrictions Weight Bearing Restrictions: No Other Position/Activity Restrictions: WBAT    Mobility  Bed Mobility Overal bed mobility: Needs Assistance Bed Mobility: Supine to Sit     Supine to sit: Min assist;+2 for safety/equipment     General bed mobility comments: increased time with cues for sequence and use of R LE to self assist  Transfers Overall transfer level: Needs assistance Equipment used: Rolling walker (2 wheeled) Transfers: Sit to/from [facility] Sit to Stand: Min assist;+2 physical assistance;+2 safety/equipment;From elevated surface Stand pivot transfers: Min assist;+2 physical assistance;+2 safety/equipment;From elevated surface       General transfer comment: cues for LE management and use of UEs to self assist  Ambulation/Gait Ambulation/Gait assistance: Min assist;+2 physical assistance;+2 safety/equipment Gait Distance (Feet): 4 Feet Assistive device: Rolling walker (2 wheeled) Gait Pattern/deviations: Step-to pattern;Decreased step length - right;Decreased step length - left;Shuffle;Trunk flexed Gait velocity: decr   General Gait Details: cues for sequence, posture and position from RW - distance ltd by dizziness and falling  BP   Stairs             Wheelchair Mobility    Modified Rankin (Stroke Patients Only)       Balance Overall balance assessment: Needs assistance Sitting-balance support: No upper extremity supported;Feet supported Sitting balance-Leahy Scale: Good     Standing balance support: Bilateral upper extremity supported Standing balance-Leahy Scale: Poor                              Cognition Arousal/Alertness: Awake/alert Behavior During Therapy: WFL for tasks assessed/performed Overall Cognitive Status: Within Functional Limits for tasks assessed                                        Exercises Total Joint Exercises Ankle Circles/Pumps: AROM;Both;15 reps;Supine Quad Sets: AROM;Both;5 reps;Supine Heel Slides: AAROM;Left;5 reps;Supine Straight Leg Raises: AAROM;Left;5 reps;Supine Goniometric ROM: AAROM L knee -10 - 25 - pain ltd with ++ muscle guarding    General Comments        Pertinent Vitals/Pain Pain Assessment: 0-10 Pain Score: 5  Pain Location: L knee Pain Descriptors / Indicators: Aching;Sore Pain Intervention(s): Limited activity within patient's tolerance;Monitored during session;Premedicated before session;Ice applied    Home Living                      Prior Function            PT Goals (current goals can now be found in the care plan section) Acute Rehab PT Goals Patient Stated Goal: Walk with less pain PT Goal Formulation: With patient Time For Goal Achievement: 10/25/18 Potential to Achieve  Goals: Good Progress towards PT goals: Progressing toward goals    Frequency    7X/week      PT Plan Current plan remains appropriate    Co-evaluation              AM-PAC PT "6 Clicks" Mobility   Outcome Measure  Help needed turning from your back to your side while in a flat bed without using bedrails?: A Little Help needed moving from lying on your back to sitting on the side of a flat bed  without using bedrails?: A Little Help needed moving to and from a bed to a chair (including a wheelchair)?: A Little Help needed standing up from a chair using your arms (e.g., wheelchair or bedside chair)?: A Little Help needed to walk in hospital room?: A Lot Help needed climbing 3-5 steps with a railing? : Total 6 Click Score: 15    End of Session Equipment Utilized During Treatment: Gait belt;Left knee immobilizer Activity Tolerance: Patient limited by fatigue Patient left: in chair;with call bell/phone within reach;with chair alarm set;with family/visitor present Nurse Communication: Mobility status PT Visit Diagnosis: Difficulty in walking, not elsewhere classified (R26.2)     Time: 0933-1000 PT Time Calculation (min) (ACUTE ONLY): 27 min  Charges:  $Gait Training: 8-22 mins $Therapeutic Exercise: 8-22 mins                     [place] Pager [phone] Office [phone]    Kristin Benjamin 10/19/2018, 12:48 PM

## 2018-10-19 NOTE — Progress Notes (Signed)
OT Cancellation Note  Patient Details Name: Kristin Benjamin MRN: [id number] DOB: 1955/11/14   Cancelled Treatment:    Reason Eval/Treat Not Completed: Other (comment) Note pt limited with mobility due to dizziness and decreased BP. Will check back on pt next date.  Pauline Aus OTR/L Acute Rehabilitation [phone] office number     10/19/2018, 2:50 PM

## 2018-10-19 NOTE — Plan of Care (Signed)

## 2018-10-19 NOTE — Discharge Instructions (Signed)

## 2018-10-19 NOTE — Care Management Note (Signed)
Case Management Note  Patient Details  Name: Kristin Benjamin MRN: [id number] Date of Birth: Aug 14, 1956  Subjective/Objective:  S/p L TKR                  Action/Plan: NCM spoke to pt and offered choice for HH/provided CMS HH list/placed in chart. Pt states her husband will be able to assist in the home. Pt agreeable to [facility] (preoperatively arranged from surgeon's office with [facility] for [facility]). Contacted [facility] for RW and 3n1 bedside commode for home. [facility] delivered to room.   Expected Discharge Date:  10/20/2018                Expected Discharge Plan:  [place]  In-House Referral:  NA  Discharge planning Services  CM Consult  Post Acute Care Choice:  Home Health Choice offered to:  Patient  DME Arranged:  3-N-1, Walker rolling DME Agency:  [place]:  PT [place] Agency:  [facility] ([facility])  Status of Service:  Completed, signed off  If discussed at [employer], dates discussed:    Additional Comments:  Erenest Rasher, RN 10/19/2018, 4:22 PM

## 2018-10-19 NOTE — Progress Notes (Signed)
Physical Therapy Treatment Patient Details Name: Kristin Benjamin MRN: [id number] DOB: February 26, 1956 Today's Date: 10/19/2018    History of Present Illness Pt s/p L TKR    PT Comments    Marked improvement in activity tolerance - pt with no c/o dizziness with mobility.   Follow Up Recommendations  Follow surgeon's recommendation for DC plan and follow-up therapies     Equipment Recommendations  3in1 (PT);Rolling walker with 5" wheels    Recommendations for Other Services       Precautions / Restrictions Precautions Precautions: Knee;Fall Required Braces or Orthoses: Knee Immobilizer - Left Knee Immobilizer - Left: Discontinue once straight leg raise with < 10 degree lag Restrictions Weight Bearing Restrictions: No Other Position/Activity Restrictions: WBAT    Mobility  Bed Mobility Overal bed mobility: Needs Assistance Bed Mobility: Sit to Supine       Sit to supine: Min assist   General bed mobility comments: increased time with cues for sequence and use of R LE to self assist  Transfers Overall transfer level: Needs assistance Equipment used: Rolling walker (2 wheeled) Transfers: Sit to/from Stand Sit to Stand: Min assist         General transfer comment: cues for LE management and use of UEs to self assist  Ambulation/Gait Ambulation/Gait assistance: Min assist Gait Distance (Feet): 58 Feet Assistive device: Rolling walker (2 wheeled) Gait Pattern/deviations: Step-to pattern;Decreased step length - right;Decreased step length - left;Shuffle;Antalgic;Trunk flexed Gait velocity: decr   General Gait Details: cues for sequence, posture and position from RW - no c/o dizziness   Stairs             Wheelchair Mobility    Modified Rankin (Stroke Patients Only)       Balance Overall balance assessment: Needs assistance Sitting-balance support: No upper extremity supported;Feet supported Sitting balance-Leahy Scale: Good     Standing balance  support: No upper extremity supported Standing balance-Leahy Scale: Fair                              Cognition Arousal/Alertness: Awake/alert Behavior During Therapy: WFL for tasks assessed/performed Overall Cognitive Status: Within Functional Limits for tasks assessed                                        Exercises Total Joint Exercises Ankle Circles/Pumps: AROM;Both;15 reps;Supine Quad Sets: AROM;Both;5 reps;Supine Heel Slides: AAROM;Left;Supine;10 reps Straight Leg Raises: AAROM;Left;Supine;10 reps Goniometric ROM: AAROM L knee -10 - 35    General Comments        Pertinent Vitals/Pain Pain Assessment: 0-10 Pain Score: 4  Pain Location: L knee Pain Descriptors / Indicators: Aching;Sore Pain Intervention(s): Limited activity within patient's tolerance;Monitored during session;Premedicated before session;Ice applied    Home Living                      Prior Function            PT Goals (current goals can now be found in the care plan section) Acute Rehab PT Goals Patient Stated Goal: Walk with less pain PT Goal Formulation: With patient Time For Goal Achievement: 10/25/18 Potential to Achieve Goals: Good Progress towards PT goals: Progressing toward goals    Frequency    7X/week      PT Plan Current plan remains appropriate    Co-evaluation  AM-PAC PT "6 Clicks" Mobility   Outcome Measure  Help needed turning from your back to your side while in a flat bed without using bedrails?: A Little Help needed moving from lying on your back to sitting on the side of a flat bed without using bedrails?: A Little Help needed moving to and from a bed to a chair (including a wheelchair)?: A Little Help needed standing up from a chair using your arms (e.g., wheelchair or bedside chair)?: A Little Help needed to walk in hospital room?: A Little Help needed climbing 3-5 steps with a railing? : A Lot 6 Click  Score: 17    End of Session Equipment Utilized During Treatment: Gait belt;Left knee immobilizer Activity Tolerance: Patient tolerated treatment well Patient left: in bed;with call bell/phone within reach;with family/visitor present Nurse Communication: Mobility status PT Visit Diagnosis: Difficulty in walking, not elsewhere classified (R26.2)     Time: 4818-5631 PT Time Calculation (min) (ACUTE ONLY): 28 min  Charges:  $Gait Training: 8-22 mins $Therapeutic Exercise: 8-22 mins                     [place] Pager [phone] Office [phone]    Raun Routh 10/19/2018, 4:20 PM

## 2018-10-20 NOTE — Progress Notes (Signed)
Physical Therapy Treatment Patient Details Name: Kristin Benjamin MRN: [id number] DOB: [birth year] Today's Date: 10/20/2018    History of Present Illness Pt s/p L TKR    PT Comments    Pt motivated and progressing steadily with mobility.  Spouse present to review don/doff KI, stairs and home therex program with written instruction provided.   Follow Up Recommendations  Follow surgeon's recommendation for DC plan and follow-up therapies     Equipment Recommendations  3in1 (PT);Rolling walker with 5" wheels    Recommendations for Other Services       Precautions / Restrictions Precautions Precautions: Knee;Fall Required Braces or Orthoses: Knee Immobilizer - Left Knee Immobilizer - Left: Discontinue once straight leg raise with < 10 degree lag Restrictions Weight Bearing Restrictions: No Other Position/Activity Restrictions: WBAT    Mobility  Bed Mobility Overal bed mobility: Needs Assistance Bed Mobility: Supine to Sit;Sit to Supine     Supine to sit: Min guard Sit to supine: Min assist   General bed mobility comments: increased time with cues for sequence and use of R LE to self assist  Transfers Overall transfer level: Needs assistance Equipment used: Rolling walker (2 wheeled) Transfers: Sit to/from Stand Sit to Stand: Min guard;Supervision         General transfer comment: min cues for LE management and use of UEs to self assist  Ambulation/Gait Ambulation/Gait assistance: Min guard;Supervision Gait Distance (Feet): 75 Feet Assistive device: Rolling walker (2 wheeled) Gait Pattern/deviations: Step-to pattern;Decreased step length - right;Decreased step length - left;Shuffle;Antalgic;Trunk flexed Gait velocity: decr   General Gait Details: cues for sequence, posture and position from RW - no c/o dizziness   Stairs Stairs: Yes Stairs assistance: Min assist Stair Management: One rail Left;Step to pattern;No rails;Backwards;Forwards;With walker;With  crutches Number of Stairs: 4 General stair comments: single step twice bkwd with RW; 2 steps fwd with crutch and rail; cues for sequence and foot/crutch/RW placement; Spouse assisting and written instruction provided   Wheelchair Mobility    Modified Rankin (Stroke Patients Only)       Balance Overall balance assessment: Needs assistance Sitting-balance support: No upper extremity supported;Feet supported Sitting balance-Leahy Scale: Good     Standing balance support: No upper extremity supported Standing balance-Leahy Scale: Fair                              Cognition Arousal/Alertness: Awake/alert Behavior During Therapy: WFL for tasks assessed/performed Overall Cognitive Status: Within Functional Limits for tasks assessed                                        Exercises Total Joint Exercises Ankle Circles/Pumps: AROM;Both;15 reps;Supine Quad Sets: AROM;Both;5 reps;Supine Heel Slides: AAROM;Left;Supine;15 reps Straight Leg Raises: AAROM;Left;Supine;10 reps Goniometric ROM: AAROM L knee -10 - 40    General Comments        Pertinent Vitals/Pain Pain Assessment: 0-10 Pain Score: 5  Pain Location: L knee Pain Descriptors / Indicators: Aching;Burning;Sore Pain Intervention(s): Limited activity within patient's tolerance;Monitored during session;Premedicated before session;Ice applied    Home Living                      Prior Function            PT Goals (current goals can now be found in the care plan section) Acute Rehab PT Goals  PT Goal Formulation: With patient Time For Goal Achievement: 10/25/18 Potential to Achieve Goals: Good Progress towards PT goals: Progressing toward goals    Frequency    7X/week      PT Plan Current plan remains appropriate    Co-evaluation              AM-PAC PT "6 Clicks" Mobility   Outcome Measure  Help needed turning from your back to your side while in a flat bed  without using bedrails?: A Little Help needed moving from lying on your back to sitting on the side of a flat bed without using bedrails?: A Little Help needed moving to and from a bed to a chair (including a wheelchair)?: A Little Help needed standing up from a chair using your arms (e.g., wheelchair or bedside chair)?: A Little Help needed to walk in hospital room?: A Little Help needed climbing 3-5 steps with a railing? : A Little 6 Click Score: 18    End of Session Equipment Utilized During Treatment: Gait belt;Left knee immobilizer Activity Tolerance: Patient tolerated treatment well Patient left: in bed;with call bell/phone within reach;with family/visitor present Nurse Communication: Mobility status PT Visit Diagnosis: Difficulty in walking, not elsewhere classified (R26.2)     Time: 1937-9024 PT Time Calculation (min) (ACUTE ONLY): 41 min  Charges:  $Gait Training: 8-22 mins $Therapeutic Exercise: 8-22 mins $Therapeutic Activity: 8-22 mins                     Debe Coder PT Acute Rehabilitation Services Pager [phone] Office [phone]    Nahdia Doucet 10/20/2018, 12:41 PM

## 2018-10-20 NOTE — Plan of Care (Signed)
  Problem: Education: Goal: Knowledge of General Education information will improve Description Including pain rating scale, medication(s)/side effects and non-pharmacologic comfort measures 10/20/2018 0319 by Blase Mess, RN Outcome: Progressing 10/20/2018 0316 by Blase Mess, RN Outcome: Progressing   Problem: Health Behavior/Discharge Planning: Goal: Ability to manage health-related needs will improve 10/20/2018 0319 by Blase Mess, RN Outcome: Progressing 10/20/2018 0316 by Blase Mess, RN Outcome: Progressing   Problem: Clinical Measurements: Goal: Ability to maintain clinical measurements within normal limits will improve 10/20/2018 0319 by Blase Mess, RN Outcome: Progressing 10/20/2018 0316 by Blase Mess, RN Outcome: Progressing Goal: Will remain free from infection 10/20/2018 0319 by Blase Mess, RN Outcome: Progressing 10/20/2018 0316 by Blase Mess, RN Outcome: Progressing Goal: Diagnostic test results will improve 10/20/2018 0319 by Blase Mess, RN Outcome: Progressing 10/20/2018 0316 by Blase Mess, RN Outcome: Progressing Goal: Respiratory complications will improve 10/20/2018 0319 by Blase Mess, RN Outcome: Progressing 10/20/2018 0316 by Blase Mess, RN Outcome: Progressing Goal: Cardiovascular complication will be avoided 10/20/2018 0319 by Blase Mess, RN Outcome: Progressing 10/20/2018 0316 by Blase Mess, RN Outcome: Progressing   Problem: Activity: Goal: Risk for activity intolerance will decrease 10/20/2018 0319 by Blase Mess, RN Outcome: Progressing 10/20/2018 0316 by Blase Mess, RN Outcome: Progressing   Problem: Nutrition: Goal: Adequate nutrition will be maintained 10/20/2018 0319 by Blase Mess, RN Outcome: Progressing 10/20/2018 0316 by Blase Mess, RN Outcome: Progressing   Problem: Coping: Goal: Level  of anxiety will decrease 10/20/2018 0319 by Blase Mess, RN Outcome: Progressing 10/20/2018 0316 by Blase Mess, RN Outcome: Progressing   Problem: Elimination: Goal: Will not experience complications related to bowel motility 10/20/2018 0319 by Blase Mess, RN Outcome: Progressing 10/20/2018 0316 by Blase Mess, RN Outcome: Progressing Goal: Will not experience complications related to urinary retention 10/20/2018 0319 by Blase Mess, RN Outcome: Progressing 10/20/2018 0316 by Blase Mess, RN Outcome: Progressing   Problem: Pain Managment: Goal: General experience of comfort will improve 10/20/2018 0319 by Blase Mess, RN Outcome: Progressing 10/20/2018 0316 by Blase Mess, RN Outcome: Progressing   Problem: Safety: Goal: Ability to remain free from injury will improve 10/20/2018 0319 by Blase Mess, RN Outcome: Progressing 10/20/2018 0316 by Blase Mess, RN Outcome: Progressing   Problem: Skin Integrity: Goal: Risk for impaired skin integrity will decrease 10/20/2018 0319 by Blase Mess, RN Outcome: Progressing 10/20/2018 0316 by Blase Mess, RN Outcome: Progressing   Problem: Education: Goal: Knowledge of the prescribed therapeutic regimen will improve 10/20/2018 0319 by Blase Mess, RN Outcome: Progressing 10/20/2018 0316 by Blase Mess, RN Outcome: Progressing Goal: Individualized Educational Video(s) 10/20/2018 0319 by Blase Mess, RN Outcome: Progressing 10/20/2018 0316 by Blase Mess, RN Outcome: Progressing   Problem: Activity: Goal: Ability to avoid complications of mobility impairment will improve 10/20/2018 0319 by Blase Mess, RN Outcome: Progressing 10/20/2018 0316 by Blase Mess, RN Outcome: Progressing Goal: Range of joint motion will improve 10/20/2018 0319 by Blase Mess, RN Outcome: Progressing 10/20/2018  0316 by Blase Mess, RN Outcome: Progressing   Problem: Clinical Measurements: Goal: Postoperative complications will be avoided or minimized 10/20/2018 0319 by Blase Mess, RN Outcome: Progressing 10/20/2018 0316 by Blase Mess, RN Outcome: Progressing   Problem: Pain Management: Goal: Pain level will decrease with appropriate interventions 10/20/2018 0319 by Blase Mess, RN Outcome: Progressing 10/20/2018 0316 by Blase Mess, RN Outcome: Progressing   Problem: Skin Integrity: Goal: Will show signs of wound healing 10/20/2018 0319 by Blase Mess, RN Outcome: Progressing 10/20/2018 0316 by Blase Mess, RN Outcome: Progressing

## 2018-10-20 NOTE — Evaluation (Signed)
Occupational Therapy Evaluation Patient Details Name: Kristin Benjamin MRN: [id number] DOB: 1956/05/23 Today's Date: 10/20/2018    History of Present Illness Pt s/p L TKR   Clinical Impression   PATIENT WAS VERY MOTIVATED AND COOPERATIVE DURING SESSION. PATIENT WAS EDUCATED ON USE OF AE FOR LE ADLS AND REQUIRED MOD A WITH TASK. PATIENT WAS ABLE TO DON./DOFF KI WITH MOD A. PATIENT REQUIRED MIN A WITH SIT TO STAND FROM BED AND WAS MIN GUARD ASSIST TO AMB TO BATHROOM. PATIENT WAS MIN GUARD ASSIST WITH TRANSFER TO 301. PATIENT REQUIRED CUES FOR PROPER HAND PLACEMENT FOR SIT TO STAND AND STAND TO SIT. [facility] BED FOR HOME SECONDARY SHE HAS 14 STEPS TO REACH BEDROOM. DISCUSSED IT WITH PATIENT NURSE WHO STATED SHE WOULD LET CASE MANAGEMENT KNOW. PATIENT PLANS TO D/C HOME TODAY WITH HOME HEALTH SERVICES.    Follow Up Recommendations  Home health OT    Equipment Recommendations  Hospital bed    Recommendations for Other Services       Precautions / Restrictions Precautions Precautions: Knee;Fall Required Braces or Orthoses: Knee Immobilizer - Left Knee Immobilizer - Left: Discontinue once straight leg raise with < 10 degree lag Restrictions Weight Bearing Restrictions: No Other Position/Activity Restrictions: WBAT      Mobility Bed Mobility   Bed Mobility: Supine to Sit;Sit to Supine     Supine to sit: Min assist Sit to supine: Min assist      Transfers       Sit to Stand: Min guard;Min assist(MIN A BED AND MIN GUARD ASSIST 3-1) Stand pivot transfers: Min guard       General transfer comment: CUES TO KICK OUT LEG AND PROPER HAND PLACEMENT     Balance                                           ADL either performed or assessed with clinical judgement   ADL Overall ADL's : Needs assistance/impaired Eating/Feeding: Independent   Grooming: Wash/dry hands;Wash/dry face;Supervision/safety;Standing   Upper Body Bathing: Set up;Sitting    Lower Body Bathing: Min guard;Sit to/from stand   Upper Body Dressing : Set up;Sitting   Lower Body Dressing: Moderate assistance;With adaptive equipment;Sit to/from stand   Toilet Transfer: Min guard;Ambulation;BSC   Toileting- Water quality scientist and Hygiene: Supervision/safety;Sit to/from stand       Functional mobility during ADLs: Min guard;Minimal assistance(MIN A BED AND MIN GUARD A FROM 3-1. CUES TO KICK OUT LEG) General ADL Comments: PATIENT WAS EDUCATED ON USE OF AE FOR LE ADLS. PNT WAS EDUCATED ON USE OF 3-1 FOR SHOWER AND COMMODE.      Vision Baseline Vision/History: Wears glasses Wears Glasses: At all times Patient Visual Report: No change from baseline       Perception     Praxis      Pertinent Vitals/Pain Pain Assessment: 0-10 Pain Score: 5  Pain Location: (L KNEE) Pain Descriptors / Indicators: Aching;Stabbing Pain Intervention(s): Limited activity within patient's tolerance;Monitored during session;Premedicated before session     Hand Dominance     Extremity/Trunk Assessment Upper Extremity Assessment Upper Extremity Assessment: Overall WFL for tasks assessed           Communication Communication Communication: No difficulties   Cognition Arousal/Alertness: Awake/alert Behavior During Therapy: WFL for tasks assessed/performed Overall Cognitive Status: Within Functional Limits for tasks assessed  General Comments       Exercises     Shoulder Instructions      Home Living Family/patient expects to be discharged to:: Private residence Living Arrangements: Spouse/significant other Available Help at Discharge: Family Type of Home: House Home Access: Stairs to enter Technical brewer of Steps: 4+1 Entrance Stairs-Rails: Right Home Layout: Two level Alternate Level Stairs-Number of Steps: 14 Alternate Level Stairs-Rails: Right Bathroom Shower/Tub: Radiographer, therapeutic: Standard     Home Equipment: Environmental consultant - 2 wheels;Bedside commode          Prior Functioning/Environment Level of Independence: Independent                 OT Problem List:        OT Treatment/Interventions:      OT Goals(Current goals can be found in the care plan section) Acute Rehab OT Goals Patient Stated Goal: go home today Potential to Achieve Goals: Good  OT Frequency:     Barriers to D/C:            Co-evaluation              AM-PAC OT "6 Clicks" Daily Activity     Outcome Measure Help from another person eating meals?: None Help from another person taking care of personal grooming?: A Little Help from another person toileting, which includes using toliet, bedpan, or urinal?: A Little Help from another person bathing (including washing, rinsing, drying)?: A Little Help from another person to put on and taking off regular upper body clothing?: None Help from another person to put on and taking off regular lower body clothing?: A Lot 6 Click Score: 19   End of Session Equipment Utilized During Treatment: Rolling walker;Left knee immobilizer Nurse Communication: Patient requests pain meds  Activity Tolerance: Patient tolerated treatment well Patient left: in bed;with call bell/phone within reach;with nursing/sitter in room                   Time: 0730-0822 OT Time Calculation (min): 52 min Charges:  OT General Charges $OT Visit: 1 Visit OT Evaluation $OT Eval Low Complexity: 1 Low OT Treatments $Self Care/Home Management : 32-44 mins  6 CLICKS  Kristin Benjamin 10/20/2018, 8:26 AM

## 2018-10-20 NOTE — Plan of Care (Signed)

## 2018-10-20 NOTE — Discharge Summary (Signed)
Discharge Diagnoses:  Principal Problem:   Unilateral primary osteoarthritis, left knee Active Problems:   Status post total left knee replacement   Surgeries: Procedure(s): LEFT TOTAL KNEE ARTHROPLASTY on 10/18/2018    Consultants:   Discharged Condition: Improved  Hospital Course: Kristin Benjamin is an 62 y.o. female who was admitted 10/18/2018 with a chief complaint of osteoarthritis left knee, with a final diagnosis of left knee osteoarthritis.  Patient was brought to the operating room on 10/18/2018 and underwent Procedure(s): LEFT TOTAL KNEE ARTHROPLASTY.    Patient was given perioperative antibiotics:  Anti-infectives (From admission, onward)   Start     Dose/Rate Route Frequency Ordered Stop   10/18/18 1400  ceFAZolin (ANCEF) IVPB 1 g/50 mL premix     1 g 100 mL/hr over 30 Minutes Intravenous Every 6 hours 10/18/18 1013 10/19/18 0723   10/18/18 0630  ceFAZolin (ANCEF) IVPB 2g/100 mL premix     2 g 200 mL/hr over 30 Minutes Intravenous On call to O.R. 10/18/18 7096 10/18/18 0732    .  Patient was given sequential compression devices, early ambulation, and aspirin for DVT prophylaxis.  Recent vital signs:  Patient Vitals for the past 24 hrs:  BP Temp Temp src Pulse Resp SpO2  10/20/18 0522 106/62 99.5 F (37.5 C) Oral 89 18 97 %  10/19/18 2118 101/70 99.5 F (37.5 C) Oral 94 16 90 %  .  Recent laboratory studies: Dg Knee Left Port  Result Date: 10/18/2018 CLINICAL DATA:  Status post total knee joint prosthesis placement today. EXAM: PORTABLE LEFT KNEE - 1-2 VIEW COMPARISON:  MRI of the left knee of September 07, 2018 FINDINGS: The patient has undergone total left knee joint prosthesis placement. Radiographic positioning of the prosthetic components is good. The interface with the native bone appears normal. There is air in fluid in the anterior aspect of the joint space. IMPRESSION: No immediate postprocedure complication following left total knee joint prosthesis  placement. Electronically Signed   By: David  Martinique M.D.   On: 10/18/2018 10:25    Discharge Medications:   Allergies as of 10/20/2018   No Known Allergies     Medication List    TAKE these medications   amphetamine-dextroamphetamine 5 MG tablet Commonly known as:  ADDERALL Take 5 mg by mouth daily.   aspirin 325 MG EC tablet Take 1 tablet (325 mg total) by mouth 2 (two) times daily after a meal.   atorvastatin 40 MG tablet Commonly known as:  LIPITOR Take 20 mg by mouth daily.   clonazePAM 1 MG tablet Commonly known as:  KLONOPIN Take 0.5 mg by mouth daily as needed for anxiety.   conjugated estrogens vaginal cream Commonly known as:  PREMARIN Place 1 Applicatorful vaginally 3 (three) times a week.   diclofenac sodium 1 % Gel Commonly known as:  VOLTAREN Apply 1 application topically daily as needed (arthritis).   fluticasone 50 MCG/ACT nasal spray Commonly known as:  FLONASE Place 2 sprays into both nostrils daily as needed for allergies.   ibuprofen 800 MG tablet Commonly known as:  ADVIL,MOTRIN Take 1 tablet (800 mg total) by mouth every 8 (eight) hours as needed for moderate pain.   losartan-hydrochlorothiazide 100-12.5 MG tablet Commonly known as:  HYZAAR Take 1 tablet by mouth daily.   methocarbamol 500 MG tablet Commonly known as:  ROBAXIN Take 1 tablet (500 mg total) by mouth every 6 (six) hours as needed for muscle spasms.   oxyCODONE 5 MG immediate release tablet Commonly  known as:  Oxy IR/ROXICODONE Take 1-2 tablets (5-10 mg total) by mouth every 4 (four) hours as needed for moderate pain (pain score 4-6).   sertraline 50 MG tablet Commonly known as:  ZOLOFT Take 50 mg by mouth daily.   zaleplon 10 MG capsule Commonly known as:  SONATA Take 10 mg by mouth at bedtime as needed for sleep.            Durable Medical Equipment  (From admission, onward)         Start     Ordered   10/19/18 1029  For home use only DME Walker rolling  Once     Question:  Patient needs a walker to treat with the following condition  Answer:  H/O joint surgery   10/19/18 1028   10/18/18 1014  DME 3 n 1  Once     10/18/18 1013   10/18/18 1014  DME Walker rolling  Once    Question:  Patient needs a walker to treat with the following condition  Answer:  Status post total left knee replacement   10/18/18 1013          Diagnostic Studies: Dg Knee Left Port  Result Date: 10/18/2018 CLINICAL DATA:  Status post total knee joint prosthesis placement today. EXAM: PORTABLE LEFT KNEE - 1-2 VIEW COMPARISON:  MRI of the left knee of September 07, 2018 FINDINGS: The patient has undergone total left knee joint prosthesis placement. Radiographic positioning of the prosthetic components is good. The interface with the native bone appears normal. There is air in fluid in the anterior aspect of the joint space. IMPRESSION: No immediate postprocedure complication following left total knee joint prosthesis placement. Electronically Signed   By: David  Martinique M.D.   On: 10/18/2018 10:25    Patient benefited maximally from their hospital stay and there were no complications.     Disposition: Discharge disposition: 01-Home or Self Care      Discharge Instructions    Call MD / Call 911   Complete by:  As directed    If you experience chest pain or shortness of breath, CALL 911 and be transported to the hospital emergency room.  If you develope a fever above 101 F, pus (white drainage) or increased drainage or redness at the wound, or calf pain, call your surgeon's office.   Constipation Prevention   Complete by:  As directed    Drink plenty of fluids.  Prune juice may be helpful.  You may use a stool softener, such as Colace (over the counter) 100 mg twice a day.  Use MiraLax (over the counter) for constipation as needed.   Diet - low sodium heart healthy   Complete by:  As directed    Increase activity slowly as tolerated   Complete by:  As directed       Follow-up Information    Mcarthur Rossetti, MD Follow up in 2 week(s).   Specialty:  Orthopedic Surgery Contact information: [place] [address] [phone]        [facility] Follow up.   Specialty:  [place] Why:  [place] will call to arrange initial visit Contact information: [place] [address] [phone]            Signed: Newt Minion 10/20/2018, 9:11 AM

## 2018-10-20 NOTE — Progress Notes (Signed)
Patient ID: Kristin Benjamin, female   DOB: 1956-06-18, 62 y.o.   MRN: [id number] Patient is status post total knee arthroplasty.  She is ready for discharge today.  She states she needs a hospital bed.  Patient states she would prefer to be discharged today and obtain the hospital bed Monday

## 2018-10-20 NOTE — Care Management (Signed)
..      Durable Medical Equipment  (From admission, onward)         Start     Ordered   10/20/18 1038  For home use only DME Hospital bed  Once    Question Answer Comment  Patient has (list medical condition): left total knee replacement   The above medical condition requires: Patient requires the ability to reposition frequently   Head must be elevated greater than: 30 degrees   Bed type Semi-electric   [place] Yes      10/20/18 1037   10/19/18 1029  For home use only DME Walker rolling  Once    Question:  Patient needs a walker to treat with the following condition  Answer:  H/O joint surgery   10/19/18 1028   10/18/18 1014  DME 3 n 1  Once     10/18/18 1013   10/18/18 1014  DME Walker rolling  Once    Question:  Patient needs a walker to treat with the following condition  Answer:  Status post total left knee replacement   10/18/18 1013

## 2018-10-20 NOTE — Progress Notes (Signed)
Contacted [facility] for hospital bed for home. They will arrange delivery time with patient. Jonnie Finner RN CCM Case Mgmt phone [phone]

## 2018-10-22 ENCOUNTER — Telehealth (INDEPENDENT_AMBULATORY_CARE_PROVIDER_SITE_OTHER): Payer: Self-pay

## 2018-10-22 ENCOUNTER — Encounter (INDEPENDENT_AMBULATORY_CARE_PROVIDER_SITE_OTHER): Payer: Self-pay | Admitting: Orthopaedic Surgery

## 2018-10-22 ENCOUNTER — Other Ambulatory Visit (INDEPENDENT_AMBULATORY_CARE_PROVIDER_SITE_OTHER): Payer: Self-pay | Admitting: Orthopaedic Surgery

## 2018-10-22 ENCOUNTER — Encounter (HOSPITAL_COMMUNITY): Payer: Self-pay | Admitting: Orthopaedic Surgery

## 2018-10-22 DIAGNOSIS — Z471 Aftercare following joint replacement surgery: Secondary | ICD-10-CM | POA: Diagnosis not present

## 2018-10-22 DIAGNOSIS — F329 Major depressive disorder, single episode, unspecified: Secondary | ICD-10-CM | POA: Diagnosis not present

## 2018-10-22 DIAGNOSIS — Z86010 Personal history of colonic polyps: Secondary | ICD-10-CM | POA: Diagnosis not present

## 2018-10-22 DIAGNOSIS — F419 Anxiety disorder, unspecified: Secondary | ICD-10-CM | POA: Diagnosis not present

## 2018-10-22 DIAGNOSIS — F17210 Nicotine dependence, cigarettes, uncomplicated: Secondary | ICD-10-CM | POA: Diagnosis not present

## 2018-10-22 DIAGNOSIS — E785 Hyperlipidemia, unspecified: Secondary | ICD-10-CM | POA: Diagnosis not present

## 2018-10-22 DIAGNOSIS — I10 Essential (primary) hypertension: Secondary | ICD-10-CM | POA: Diagnosis not present

## 2018-10-22 DIAGNOSIS — Z96652 Presence of left artificial knee joint: Secondary | ICD-10-CM | POA: Diagnosis not present

## 2018-10-22 MED ORDER — OXYCODONE HCL 5 MG PO TABS: 5.0000 mg | ORAL_TABLET | ORAL | 0 refills | Status: DC | PRN

## 2018-10-22 NOTE — Telephone Encounter (Signed)
Verbal order given to [facility]

## 2018-10-22 NOTE — Telephone Encounter (Signed)
I sent in some oxycodone.

## 2018-10-22 NOTE — Telephone Encounter (Signed)
Anderson Malta, PT with [facility] would like verbal orders for 3 x week for 2 weeks.  Patient would like OT referral to [facility] in [facility].  Patient will be discharged on 11/01/2018, per [facility].  Cb# is [phone]. Please advise.

## 2018-10-22 NOTE — Telephone Encounter (Signed)
Patient asking for a refill on pain medication

## 2018-10-24 ENCOUNTER — Encounter (INDEPENDENT_AMBULATORY_CARE_PROVIDER_SITE_OTHER): Payer: Self-pay | Admitting: Orthopaedic Surgery

## 2018-10-24 DIAGNOSIS — F17210 Nicotine dependence, cigarettes, uncomplicated: Secondary | ICD-10-CM | POA: Diagnosis not present

## 2018-10-24 DIAGNOSIS — E785 Hyperlipidemia, unspecified: Secondary | ICD-10-CM | POA: Diagnosis not present

## 2018-10-24 DIAGNOSIS — F419 Anxiety disorder, unspecified: Secondary | ICD-10-CM | POA: Diagnosis not present

## 2018-10-24 DIAGNOSIS — F329 Major depressive disorder, single episode, unspecified: Secondary | ICD-10-CM | POA: Diagnosis not present

## 2018-10-24 DIAGNOSIS — Z86010 Personal history of colonic polyps: Secondary | ICD-10-CM | POA: Diagnosis not present

## 2018-10-24 DIAGNOSIS — Z471 Aftercare following joint replacement surgery: Secondary | ICD-10-CM | POA: Diagnosis not present

## 2018-10-24 DIAGNOSIS — I10 Essential (primary) hypertension: Secondary | ICD-10-CM | POA: Diagnosis not present

## 2018-10-24 DIAGNOSIS — Z96652 Presence of left artificial knee joint: Secondary | ICD-10-CM | POA: Diagnosis not present

## 2018-10-25 ENCOUNTER — Telehealth (INDEPENDENT_AMBULATORY_CARE_PROVIDER_SITE_OTHER): Payer: Self-pay

## 2018-10-25 ENCOUNTER — Encounter (INDEPENDENT_AMBULATORY_CARE_PROVIDER_SITE_OTHER): Payer: Self-pay | Admitting: Orthopaedic Surgery

## 2018-10-25 ENCOUNTER — Other Ambulatory Visit (INDEPENDENT_AMBULATORY_CARE_PROVIDER_SITE_OTHER): Payer: Self-pay

## 2018-10-25 ENCOUNTER — Other Ambulatory Visit (INDEPENDENT_AMBULATORY_CARE_PROVIDER_SITE_OTHER): Payer: Self-pay | Admitting: Orthopaedic Surgery

## 2018-10-25 DIAGNOSIS — E785 Hyperlipidemia, unspecified: Secondary | ICD-10-CM | POA: Diagnosis not present

## 2018-10-25 DIAGNOSIS — I10 Essential (primary) hypertension: Secondary | ICD-10-CM | POA: Diagnosis not present

## 2018-10-25 DIAGNOSIS — Z96652 Presence of left artificial knee joint: Secondary | ICD-10-CM | POA: Diagnosis not present

## 2018-10-25 DIAGNOSIS — F329 Major depressive disorder, single episode, unspecified: Secondary | ICD-10-CM | POA: Diagnosis not present

## 2018-10-25 DIAGNOSIS — Z471 Aftercare following joint replacement surgery: Secondary | ICD-10-CM | POA: Diagnosis not present

## 2018-10-25 DIAGNOSIS — F419 Anxiety disorder, unspecified: Secondary | ICD-10-CM | POA: Diagnosis not present

## 2018-10-25 DIAGNOSIS — Z86010 Personal history of colonic polyps: Secondary | ICD-10-CM | POA: Diagnosis not present

## 2018-10-25 DIAGNOSIS — F17210 Nicotine dependence, cigarettes, uncomplicated: Secondary | ICD-10-CM | POA: Diagnosis not present

## 2018-10-25 DIAGNOSIS — M1712 Unilateral primary osteoarthritis, left knee: Secondary | ICD-10-CM

## 2018-10-25 MED ORDER — METHOCARBAMOL 500 MG PO TABS: 500.0000 mg | ORAL_TABLET | Freq: Four times a day (QID) | ORAL | 0 refills | Status: DC | PRN

## 2018-10-25 MED ORDER — OXYCODONE HCL 5 MG PO TABS: 5.0000 mg | ORAL_TABLET | ORAL | 0 refills | Status: DC | PRN

## 2018-10-25 NOTE — Telephone Encounter (Signed)
That will be fine. 

## 2018-10-25 NOTE — Telephone Encounter (Signed)
Referral made 

## 2018-10-25 NOTE — Telephone Encounter (Signed)
Okay to put PT order in ?

## 2018-10-25 NOTE — Telephone Encounter (Signed)
Anderson Malta, PT with [facility] would like a referral placed for Occupational Therapy at [facility] in [facility].  Would like for patient to start the week of Christmas.  CB# is [phone].  Please advise.  Thank you.

## 2018-10-28 ENCOUNTER — Encounter (INDEPENDENT_AMBULATORY_CARE_PROVIDER_SITE_OTHER): Payer: Self-pay | Admitting: Orthopaedic Surgery

## 2018-10-28 ENCOUNTER — Other Ambulatory Visit (INDEPENDENT_AMBULATORY_CARE_PROVIDER_SITE_OTHER): Payer: Self-pay

## 2018-10-28 DIAGNOSIS — I10 Essential (primary) hypertension: Secondary | ICD-10-CM | POA: Diagnosis not present

## 2018-10-28 DIAGNOSIS — M6281 Muscle weakness (generalized): Secondary | ICD-10-CM | POA: Diagnosis not present

## 2018-10-28 DIAGNOSIS — Z471 Aftercare following joint replacement surgery: Secondary | ICD-10-CM | POA: Diagnosis not present

## 2018-10-28 DIAGNOSIS — F329 Major depressive disorder, single episode, unspecified: Secondary | ICD-10-CM | POA: Diagnosis not present

## 2018-10-28 DIAGNOSIS — E785 Hyperlipidemia, unspecified: Secondary | ICD-10-CM | POA: Diagnosis not present

## 2018-10-28 DIAGNOSIS — F419 Anxiety disorder, unspecified: Secondary | ICD-10-CM | POA: Diagnosis not present

## 2018-10-28 DIAGNOSIS — Z96652 Presence of left artificial knee joint: Secondary | ICD-10-CM

## 2018-10-28 DIAGNOSIS — Z86010 Personal history of colonic polyps: Secondary | ICD-10-CM | POA: Diagnosis not present

## 2018-10-28 DIAGNOSIS — F17210 Nicotine dependence, cigarettes, uncomplicated: Secondary | ICD-10-CM | POA: Diagnosis not present

## 2018-10-29 ENCOUNTER — Encounter (INDEPENDENT_AMBULATORY_CARE_PROVIDER_SITE_OTHER): Payer: Self-pay | Admitting: Orthopaedic Surgery

## 2018-10-29 DIAGNOSIS — Z86010 Personal history of colonic polyps: Secondary | ICD-10-CM | POA: Diagnosis not present

## 2018-10-29 DIAGNOSIS — E785 Hyperlipidemia, unspecified: Secondary | ICD-10-CM | POA: Diagnosis not present

## 2018-10-29 DIAGNOSIS — Z96652 Presence of left artificial knee joint: Secondary | ICD-10-CM | POA: Diagnosis not present

## 2018-10-29 DIAGNOSIS — I10 Essential (primary) hypertension: Secondary | ICD-10-CM | POA: Diagnosis not present

## 2018-10-29 DIAGNOSIS — M6281 Muscle weakness (generalized): Secondary | ICD-10-CM | POA: Diagnosis not present

## 2018-10-29 DIAGNOSIS — F419 Anxiety disorder, unspecified: Secondary | ICD-10-CM | POA: Diagnosis not present

## 2018-10-29 DIAGNOSIS — F17210 Nicotine dependence, cigarettes, uncomplicated: Secondary | ICD-10-CM | POA: Diagnosis not present

## 2018-10-29 DIAGNOSIS — Z471 Aftercare following joint replacement surgery: Secondary | ICD-10-CM | POA: Diagnosis not present

## 2018-10-29 DIAGNOSIS — F329 Major depressive disorder, single episode, unspecified: Secondary | ICD-10-CM | POA: Diagnosis not present

## 2018-10-30 ENCOUNTER — Other Ambulatory Visit (INDEPENDENT_AMBULATORY_CARE_PROVIDER_SITE_OTHER): Payer: Self-pay | Admitting: Orthopaedic Surgery

## 2018-10-30 DIAGNOSIS — Z96652 Presence of left artificial knee joint: Secondary | ICD-10-CM | POA: Diagnosis not present

## 2018-10-30 DIAGNOSIS — F17210 Nicotine dependence, cigarettes, uncomplicated: Secondary | ICD-10-CM | POA: Diagnosis not present

## 2018-10-30 DIAGNOSIS — E785 Hyperlipidemia, unspecified: Secondary | ICD-10-CM | POA: Diagnosis not present

## 2018-10-30 DIAGNOSIS — M6281 Muscle weakness (generalized): Secondary | ICD-10-CM | POA: Diagnosis not present

## 2018-10-30 DIAGNOSIS — Z86010 Personal history of colonic polyps: Secondary | ICD-10-CM | POA: Diagnosis not present

## 2018-10-30 DIAGNOSIS — Z471 Aftercare following joint replacement surgery: Secondary | ICD-10-CM | POA: Diagnosis not present

## 2018-10-30 DIAGNOSIS — F419 Anxiety disorder, unspecified: Secondary | ICD-10-CM | POA: Diagnosis not present

## 2018-10-30 DIAGNOSIS — I10 Essential (primary) hypertension: Secondary | ICD-10-CM | POA: Diagnosis not present

## 2018-10-30 DIAGNOSIS — F329 Major depressive disorder, single episode, unspecified: Secondary | ICD-10-CM | POA: Diagnosis not present

## 2018-10-30 MED ORDER — OXYCODONE HCL 5 MG PO TABS: 5.0000 mg | ORAL_TABLET | ORAL | 0 refills | Status: DC | PRN

## 2018-10-30 MED ORDER — METHOCARBAMOL 500 MG PO TABS: 500.0000 mg | ORAL_TABLET | Freq: Four times a day (QID) | ORAL | 0 refills | Status: DC | PRN

## 2018-10-31 ENCOUNTER — Encounter (INDEPENDENT_AMBULATORY_CARE_PROVIDER_SITE_OTHER): Payer: Self-pay | Admitting: Physician Assistant

## 2018-10-31 ENCOUNTER — Ambulatory Visit (INDEPENDENT_AMBULATORY_CARE_PROVIDER_SITE_OTHER): Payer: BLUE CROSS/BLUE SHIELD | Admitting: Physician Assistant

## 2018-10-31 DIAGNOSIS — Z96652 Presence of left artificial knee joint: Secondary | ICD-10-CM

## 2018-10-31 NOTE — Progress Notes (Signed)
HPI: Kristin Benjamin comes in today 13 days status post left total knee arthroplasty.  She is overall doing well.  She is ambulating with a cane.  She denies any fevers chills shortness of breath chest pain.  She is taking aspirin for DVT prophylaxis.  Physical exam: Left knee surgical incisions well approximated with subcu Monocryl stitch.  No signs of infection no drainage.  Left calf supple nontender she has full extension able do straight leg raise.  She flexes only to 45 degrees.  Impression: 13 days status post left total knee arthroplasty  Plan: New Steri-Strips applied.  She can get the incision wet in the shower no soaking.  She will continue to work with physical therapy on range of motion strengthening.  She has outpatient physical therapy set up in [facility].  She will continue with aspirin once daily for another week and then discontinue.  Follow-up with [place] in 1 month sooner if there is any questions or concerns.

## 2018-11-01 ENCOUNTER — Other Ambulatory Visit (INDEPENDENT_AMBULATORY_CARE_PROVIDER_SITE_OTHER): Payer: Self-pay | Admitting: Orthopaedic Surgery

## 2018-11-01 ENCOUNTER — Encounter (INDEPENDENT_AMBULATORY_CARE_PROVIDER_SITE_OTHER): Payer: Self-pay | Admitting: Orthopaedic Surgery

## 2018-11-01 DIAGNOSIS — Z96652 Presence of left artificial knee joint: Secondary | ICD-10-CM | POA: Diagnosis not present

## 2018-11-01 DIAGNOSIS — F419 Anxiety disorder, unspecified: Secondary | ICD-10-CM | POA: Diagnosis not present

## 2018-11-01 DIAGNOSIS — F329 Major depressive disorder, single episode, unspecified: Secondary | ICD-10-CM | POA: Diagnosis not present

## 2018-11-01 DIAGNOSIS — M6281 Muscle weakness (generalized): Secondary | ICD-10-CM | POA: Diagnosis not present

## 2018-11-01 DIAGNOSIS — E785 Hyperlipidemia, unspecified: Secondary | ICD-10-CM | POA: Diagnosis not present

## 2018-11-01 DIAGNOSIS — Z471 Aftercare following joint replacement surgery: Secondary | ICD-10-CM | POA: Diagnosis not present

## 2018-11-01 DIAGNOSIS — I10 Essential (primary) hypertension: Secondary | ICD-10-CM | POA: Diagnosis not present

## 2018-11-01 DIAGNOSIS — Z86010 Personal history of colonic polyps: Secondary | ICD-10-CM | POA: Diagnosis not present

## 2018-11-01 DIAGNOSIS — F17210 Nicotine dependence, cigarettes, uncomplicated: Secondary | ICD-10-CM | POA: Diagnosis not present

## 2018-11-01 MED ORDER — OXYCODONE HCL 5 MG PO TABS: 5.0000 mg | ORAL_TABLET | ORAL | 0 refills | Status: DC | PRN

## 2018-11-02 ENCOUNTER — Encounter (INDEPENDENT_AMBULATORY_CARE_PROVIDER_SITE_OTHER): Payer: Self-pay | Admitting: Orthopaedic Surgery

## 2018-11-04 ENCOUNTER — Other Ambulatory Visit: Payer: Self-pay

## 2018-11-04 ENCOUNTER — Encounter (INDEPENDENT_AMBULATORY_CARE_PROVIDER_SITE_OTHER): Payer: Self-pay

## 2018-11-04 ENCOUNTER — Ambulatory Visit: Payer: BLUE CROSS/BLUE SHIELD | Attending: Orthopaedic Surgery | Admitting: Physical Therapy

## 2018-11-04 ENCOUNTER — Telehealth (INDEPENDENT_AMBULATORY_CARE_PROVIDER_SITE_OTHER): Payer: Self-pay

## 2018-11-04 ENCOUNTER — Encounter: Payer: Self-pay | Admitting: Physical Therapy

## 2018-11-04 DIAGNOSIS — R2689 Other abnormalities of gait and mobility: Secondary | ICD-10-CM

## 2018-11-04 DIAGNOSIS — M25662 Stiffness of left knee, not elsewhere classified: Secondary | ICD-10-CM | POA: Diagnosis not present

## 2018-11-04 DIAGNOSIS — M25562 Pain in left knee: Secondary | ICD-10-CM | POA: Diagnosis not present

## 2018-11-04 DIAGNOSIS — R262 Difficulty in walking, not elsewhere classified: Secondary | ICD-10-CM | POA: Diagnosis not present

## 2018-11-04 DIAGNOSIS — M6281 Muscle weakness (generalized): Secondary | ICD-10-CM | POA: Diagnosis not present

## 2018-11-04 DIAGNOSIS — R600 Localized edema: Secondary | ICD-10-CM | POA: Insufficient documentation

## 2018-11-04 NOTE — Therapy (Signed)
[facility] [address]  [place], [place], [address] Phone: [phone]   Fax:  [phone]  Physical Therapy Evaluation  Patient Details  Name: Kristin Benjamin MRN: [id number] Date of Birth: 12/28/1955 Referring Provider (PT): Jean Rosenthal, MD   Encounter Date: 11/04/2018  PT End of Session - 11/04/18 0950    Visit Number  1    Number of Visits  12    Date for PT Re-Evaluation  12/06/18    Authorization Type  BCBS    Authorization - Number of Visits  31    PT Start Time  0950    PT Stop Time  1048    PT Time Calculation (min)  58 min    Activity Tolerance  Patient tolerated treatment well    Behavior During Therapy  [facility] for tasks assessed/performed       Past Medical History:  Diagnosis Date  . Anxiety   . Arthritis   . Depression   . Headache    history of  . Heart murmur   . Hx of adenomatous colonic polyps 07/17/2017  . Hyperlipidemia   . Hypertension     Past Surgical History:  Procedure Laterality Date  . bladder tack    . COLONOSCOPY  2006   and 2018  . FUNCTIONAL ENDOSCOPIC SINUS SURGERY    . JOINT REPLACEMENT     Left total knee arthroplasty Dr. Ninfa Linden 10-18-18  . KNEE ARTHROSCOPY W/ MENISCAL REPAIR Right 08/2016  . TOTAL KNEE ARTHROPLASTY Left 10/18/2018   Procedure: LEFT TOTAL KNEE ARTHROPLASTY;  Surgeon: Mcarthur Rossetti, MD;  Location: [facility];  Service: Orthopedics;  Laterality: Left;  . TUBAL LIGATION  1993    There were no vitals filed for this visit.   Subjective Assessment - 11/04/18 0955    Subjective  Pt s/p L TKR with 2 night post-op stay followed by 2 weeks HH PT finishing on Friday. Pt feels like knee is doing well, but struggles al lot with sleeping. Is able to navigate home including stairs but continues to use cane for safety.    Pertinent History  L TKR 10/18/18; severe R knee OA    Limitations  Sitting;Standing;Walking    How long can you sit  comfortably?  20-30 minutes    How long can you stand comfortably?  30 minutes    How long can you walk comfortably?  unsure    Patient Stated Goals  Get ROM back & be able to get back to working in the yard & exercise classes    Currently in Pain?  Yes    Pain Score  2    up to 5/10 at worst   Pain Location  Knee    Pain Orientation  Left    Pain Descriptors / Indicators  Dull;Aching   "bone pain"   Pain Type  Surgical pain;Acute pain    Pain Radiating Towards  n/a    Pain Onset  1 to 4 weeks ago    Pain Frequency  Intermittent    Aggravating Factors   stiffness - periods of inactivity; overdoing heel slides    Pain Relieving Factors  pain meds; ice    Effect of Pain on Daily Activities  limits exercise tolerance; unable climb stairs reciprocally         [facility] PT Assessment - 11/04/18 0950      Assessment   Medical Diagnosis  L TKR    Referring Provider (  PT)  Jean Rosenthal, MD    Onset Date/Surgical Date  10/18/18    Next MD Visit  11/28/18    Prior Therapy  HH PT x 2 weeks      Balance Screen   Has the patient fallen in the past 6 months  Yes    How many times?  1    Has the patient had a decrease in activity level because of a fear of falling?   No    Is the patient reluctant to leave their home because of a fear of falling?   No      Home Environment   Living Environment  Private residence    Living Arrangements  Spouse/significant other    Type of [place] to enter    Entrance Stairs-Number of Steps  3    Entrance Stairs-Rails  Left    Home Layout  Two level;Bed/bath upstairs    Alternate Level Stairs-Number of Steps  15    Alternate Level Stairs-Rails  Right    [place] - single point;Walker - 2 wheels;Bedside commode;Shower seat;Shower seat - built in      Prior Function   Level of Independence  Independent    Vocation  Full time employment    Engineer, site - desk job    Leisure  walking - aims  for 10-14,000 steps/day, deep water aerobics 2-3x/wk; gardening      Observation/Other Assessments   Focus on Therapeutic Outcomes (FOTO)   Knee - 55% (45% limitation); Predicted 72% (28% limitation)      ROM / Strength   AROM / PROM / Strength  AROM;PROM;Strength      AROM   AROM Assessment Site  Knee    Right/Left Knee  Right;Left    Right Knee Extension  0    Right Knee Flexion  136    Left Knee Extension  6   10 dg in LAQ   Left Knee Flexion  72      PROM   PROM Assessment Site  Knee    Right/Left Knee  Left    Left Knee Extension  2    Left Knee Flexion  79      Strength   Strength Assessment Site  Hip;Knee    Right/Left Hip  Right;Left    Right Hip Flexion  4/5    Right Hip Extension  4/5    Right Hip ABduction  4+/5    Right Hip ADduction  4/5    Left Hip Flexion  3/5    Left Hip Extension  4/5    Left Hip ABduction  4/5    Left Hip ADduction  4-/5    Right/Left Knee  Right;Left    Right Knee Flexion  5/5    Right Knee Extension  5/5    Left Knee Flexion  4/5    Left Knee Extension  4/5      Flexibility   Soft Tissue Assessment /Muscle Length  yes    Hamstrings  WFL    Quadriceps  L - mod tight      Palpation   Patella mobility  L knee - decreased all directions      Ambulation/Gait   Ambulation/Gait Assistance  6: Modified independent (Device/Increase time)    Ambulation Distance (Feet)  70 Feet    Assistive device  Straight cane    Gait Pattern  Step-through pattern;Decreased weight shift to  left;Decreased stance time - left;Decreased hip/knee flexion - left;Lateral trunk lean to right    Ambulation Surface  Level;Indoor    Gait Comments  Cane adjusted 1 notch taller to reduce lateral lean with gait.                Objective measurements completed on examination: See above findings.      [facility] Adult PT Treatment/Exercise - 11/04/18 0950      Exercises   Exercises  Knee/Hip      Knee/Hip Exercises: Stretches   Passive Hamstring  Stretch  Left;30 seconds;2 reps    Passive Hamstring Stretch Limitations  supine with strap    Quad Stretch  Left;30 seconds;2 reps    Quad Stretch Limitations  prone with strap      Knee/Hip Exercises: Standing   Functional Squat  5 reps;5 seconds    Functional Squat Limitations  counter squat      Knee/Hip Exercises: Supine   Heel Slides  Left;10 reps;AAROM    Heel Slides Limitations  with strap      Knee/Hip Exercises: Prone   Prone Knee Hang  1 minute             PT Education - 11/04/18 1045    Education Details  PT eval findings, anticipated POC & initial HEP    Person(s) Educated  Patient    Methods  Explanation;Demonstration;Handout    Comprehension  Verbalized understanding;Returned demonstration;Need further instruction          PT Long Term Goals - 11/04/18 1048      PT LONG TERM GOAL #1   Title  Independent with ongoing/advanced HEP    Status  New    Target Date  12/06/18      PT LONG TERM GOAL #2   Title  L knee AROM >/= 3-120 degrees for improved gait pattern and stair negotiation    Status  New    Target Date  12/06/18      PT LONG TERM GOAL #3   Title  L hip and knee strength >/= 4+/5 for improved stability    Status  New    Target Date  12/06/18      PT LONG TERM GOAL #4   Title  Patient will ambulate with normal gait pattern w/o AD for increased ease of community ambulation    Status  New    Target Date  12/06/18      PT LONG TERM GOAL #5   Title  Patient with negotiate stairs reciprocally with normal step pattern to increase ease of access to master bed and bathroom    Status  New    Target Date  12/06/18             Plan - 11/04/18 1048    Clinical Impression Statement  Kristin Benjamin is a 62 y/o female who presents to OP PT 16 days s/p L TKR on 10/18/18. Pt has just completed [place] PT as of Friday and has ongoing HEP from [facility]. She arrives to PT ambulating with [facility] demonstrating antalgic gait pattern favoring L LE with decreased hip and  knee flexion and decreased weight shift to L. Assessment reveals decreased AROM & PROM of L knee, very mild decreased proximal LE flexibility with decreased L patellar mobility in all planes, and mild to moderate L hip and knee weakness with mild quad lag. Pt will benefit from skilled PT intervention to address the above listed deficits to allow for improved gait  to maximize function and mobility.    History and Personal Factors relevant to plan of care:  severe OA in R knee    Clinical Presentation  Stable    Clinical Decision Making  Low    Rehab Potential  Excellent    PT Frequency  3x / week    PT Duration  4 weeks    PT Treatment/Interventions  Patient/family education;Therapeutic exercise;Therapeutic activities;Functional mobility training;Gait training;Stair training;Balance training;Neuromuscular re-education;Manual techniques;Scar mobilization;Passive range of motion;Dry needling;Taping;Joint Manipulations;Electrical Stimulation;Cryotherapy;Vasopneumatic Device;Moist Heat;ADLs/Self Care Home Management    PT Next Visit Plan  TKR protocol    Consulted and Agree with Plan of Care  Patient       Patient will benefit from skilled therapeutic intervention in order to improve the following deficits and impairments:  Pain, Decreased range of motion, Impaired flexibility, Decreased strength, Difficulty walking, Abnormal gait, Decreased activity tolerance, Decreased balance, Decreased scar mobility  Visit Diagnosis: Acute pain of left knee  Stiffness of left knee, not elsewhere classified  Muscle weakness (generalized)  Other abnormalities of gait and mobility  Difficulty in walking, not elsewhere classified  Localized edema     Problem List Patient Active Problem List   Diagnosis Date Noted  . Unilateral primary osteoarthritis, left knee 10/18/2018  . Status post total left knee replacement 10/18/2018  . Unilateral primary osteoarthritis, right knee 03/28/2018  . Chronic pain of  right knee 03/28/2018  . Hx of adenomatous colonic polyps 07/17/2017    Percival Spanish, PT, MPT 11/04/2018, 12:44 PM  [facility] [address]  [place], [place], [address] Phone: [phone]   Fax:  [phone]  Name: Kristin Benjamin MRN: [id number] Date of Birth: [birth year]

## 2018-11-04 NOTE — Telephone Encounter (Signed)
I spoke with the patient on Saturday.  She has been through a large amount of narcotics.  She seemed to understand why I could not feel these are widely pharmacy would not fill these based on narcotics allows.  Please call the pharmacy and see what they will allow in terms of what else I can prescribe even if it is going to hydrocodone.

## 2018-11-04 NOTE — Telephone Encounter (Signed)
[facility] in [place] called stating that they have some questions concerning a Rx for the patient.  Would like a CB at [phone].  Please advise.  Thank you.

## 2018-11-04 NOTE — Telephone Encounter (Signed)
Spoke with pharmacist letting him know waiting until 25th-26th to refill is appropriate

## 2018-11-04 NOTE — Telephone Encounter (Signed)
They won't fill this until day after christmas, and patient is upset about this

## 2018-11-08 ENCOUNTER — Encounter: Payer: Self-pay | Admitting: Physical Therapy

## 2018-11-08 ENCOUNTER — Ambulatory Visit: Payer: BLUE CROSS/BLUE SHIELD | Admitting: Physical Therapy

## 2018-11-08 DIAGNOSIS — M6281 Muscle weakness (generalized): Secondary | ICD-10-CM

## 2018-11-08 DIAGNOSIS — R600 Localized edema: Secondary | ICD-10-CM | POA: Diagnosis not present

## 2018-11-08 DIAGNOSIS — M25562 Pain in left knee: Secondary | ICD-10-CM | POA: Diagnosis not present

## 2018-11-08 DIAGNOSIS — R2689 Other abnormalities of gait and mobility: Secondary | ICD-10-CM | POA: Diagnosis not present

## 2018-11-08 DIAGNOSIS — R262 Difficulty in walking, not elsewhere classified: Secondary | ICD-10-CM | POA: Diagnosis not present

## 2018-11-08 DIAGNOSIS — M25662 Stiffness of left knee, not elsewhere classified: Secondary | ICD-10-CM

## 2018-11-08 NOTE — Therapy (Signed)
[facility] [address]  [place], [place], [address] Phone: [phone]   Fax:  [phone]  Physical Therapy Treatment  Patient Details  Name: Kristin Benjamin MRN: [id number] Date of Birth: 1956-09-26 Referring Provider (PT): Jean Rosenthal, MD   Encounter Date: 11/08/2018  PT End of Session - 11/08/18 0926    Visit Number  2    Number of Visits  12    Date for PT Re-Evaluation  12/06/18    Authorization Type  BCBS    Authorization - Number of Visits  31    PT Start Time  0844    PT Stop Time  0937    PT Time Calculation (min)  53 min    Activity Tolerance  Patient tolerated treatment well;Patient limited by pain    Behavior During Therapy  [facility] for tasks assessed/performed       Past Medical History:  Diagnosis Date  . Anxiety   . Arthritis   . Depression   . Headache    history of  . Heart murmur   . Hx of adenomatous colonic polyps 07/17/2017  . Hyperlipidemia   . Hypertension     Past Surgical History:  Procedure Laterality Date  . bladder tack    . COLONOSCOPY  2006   and 2018  . FUNCTIONAL ENDOSCOPIC SINUS SURGERY    . JOINT REPLACEMENT     Left total knee arthroplasty Dr. Ninfa Linden 10-18-18  . KNEE ARTHROSCOPY W/ MENISCAL REPAIR Right 08/2016  . TOTAL KNEE ARTHROPLASTY Left 10/18/2018   Procedure: LEFT TOTAL KNEE ARTHROPLASTY;  Surgeon: Mcarthur Rossetti, MD;  Location: [facility];  Service: Orthopedics;  Laterality: Left;  . TUBAL LIGATION  1993    There were no vitals filed for this visit.  Subjective Assessment - 11/08/18 0846    Subjective  Reports that she is feeling stiff today- got off track with her exercises during christmas time. Reports that her cane has been throwing her gait off.     Pertinent History  L TKR 10/18/18; severe R knee OA    Patient Stated Goals  Get ROM back & be able to get back to working in the yard & exercise classes    Currently in Pain?  Yes     Pain Score  3     Pain Location  Knee    Pain Orientation  Left    Pain Descriptors / Indicators  --   stiff   Pain Type  Acute pain;Surgical pain                       [facility] Adult PT Treatment/Exercise - 11/08/18 0001      Ambulation/Gait   Ambulation/Gait Assistance  5: Supervision    Ambulation Distance (Feet)  180 Feet    Assistive device  Straight cane;None    Gait Pattern  Step-through pattern;Decreased weight shift to left;Decreased stance time - left;Decreased hip/knee flexion - left;Lateral trunk lean to right    Ambulation Surface  Level;Indoor    Gait Comments  decreased heel strike and toe clearance when ambulating without SPC      Knee/Hip Exercises: Stretches   Passive Hamstring Stretch  Left;2 reps;30 seconds    Passive Hamstring Stretch Limitations  supine with strap    Quad Stretch  Left;30 seconds;2 reps    Quad Stretch Limitations  prone with strap    Knee: Self-Stretch to increase Flexion  Left;5 reps;Limitations  Knee: Self-Stretch Limitations  5x5" to tolerance      Knee/Hip Exercises: Aerobic   Nustep  L1 x 6 min UEs/LEs      Knee/Hip Exercises: Standing   Functional Squat  10 reps;5 seconds    Functional Squat Limitations  at counter top to tolerance      Knee/Hip Exercises: Supine   Heel Slides  Left;10 reps;AAROM    Heel Slides Limitations  with strap on orange pball    Bridges  Strengthening;Both;1 set;10 reps    Straight Leg Raises  Strengthening;Left;1 set;10 reps    Straight Leg Raises Limitations  cues for quad set before each rep    Straight Leg Raise with External Rotation  Strengthening;Left;1 set;10 reps;Limitations      Knee/Hip Exercises: Sidelying   Hip ABduction  Strengthening;Right;Left;1 set;10 reps    Hip ABduction Limitations  slight cueing for alignment    Hip ADduction  Strengthening;Right;Left;1 set;10 reps    Hip ADduction Limitations  opposite LE propped on bolster      Knee/Hip Exercises: Prone   Hip  Extension  Strengthening;Right;Left;1 set;10 reps    Hip Extension Limitations  prone donkey kicks      Modalities   Modalities  Vasopneumatic      Vasopneumatic   Number Minutes Vasopneumatic   15 minutes    Vasopnuematic Location   Knee   L   Vasopneumatic Pressure  Medium    Vasopneumatic Temperature   coldest             PT Education - 11/08/18 0926    Education Details  update to HEP    Person(s) Educated  Patient    Methods  Explanation;Demonstration;Tactile cues;Verbal cues;Handout    Comprehension  Verbalized understanding;Returned demonstration          PT Long Term Goals - 11/08/18 0927      PT LONG TERM GOAL #1   Title  Independent with ongoing/advanced HEP    Status  On-going      PT LONG TERM GOAL #2   Title  L knee AROM >/= 3-120 degrees for improved gait pattern and stair negotiation    Status  On-going      PT LONG TERM GOAL #3   Title  L hip and knee strength >/= 4+/5 for improved stability    Status  On-going      PT LONG TERM GOAL #4   Title  Patient will ambulate with normal gait pattern w/o AD for increased ease of community ambulation    Status  On-going      PT LONG TERM GOAL #5   Title  Patient with negotiate stairs reciprocally with normal step pattern to increase ease of access to master bed and bathroom    Status  On-going            Plan - 11/08/18 0927    Clinical Impression Statement  Patient arrived to session using SPC and reporting stiffness in L knee today after getting off track with her HEP during Christmas time. Worked on gait training with and without SPC- patient demonstrating decreased arm swing, heel strike, and toe clearance with ambulation without SPC. Patient with limited knee flexion tolerance with heel slides, however demonstrating good quad control with SLR. Introduced sidelying hip abduction and adduction with good tolerance and good form without consistent instruction necessary. Patient reported non-painful  popping in L knee during ther-ex today. Reports that she has been performing mini squats at home, breaking her 10  reps into 2 sets of 5, however able to complete full set of 10 reps today, albeit with some reported pain. Addressed L knee pain and edema with [place]. No complaints reported at end of session.     PT Treatment/Interventions  Patient/family education;Therapeutic exercise;Therapeutic activities;Functional mobility training;Gait training;Stair training;Balance training;Neuromuscular re-education;Manual techniques;Scar mobilization;Passive range of motion;Dry needling;Taping;Joint Manipulations;Electrical Stimulation;Cryotherapy;Vasopneumatic Device;Moist Heat;ADLs/Self Care Home Management    PT Next Visit Plan  TKR protocol    Consulted and Agree with Plan of Care  Patient       Patient will benefit from skilled therapeutic intervention in order to improve the following deficits and impairments:  Pain, Decreased range of motion, Impaired flexibility, Decreased strength, Difficulty walking, Abnormal gait, Decreased activity tolerance, Decreased balance, Decreased scar mobility  Visit Diagnosis: Acute pain of left knee  Stiffness of left knee, not elsewhere classified  Muscle weakness (generalized)  Other abnormalities of gait and mobility  Difficulty in walking, not elsewhere classified  Localized edema     Problem List Patient Active Problem List   Diagnosis Date Noted  . Unilateral primary osteoarthritis, left knee 10/18/2018  . Status post total left knee replacement 10/18/2018  . Unilateral primary osteoarthritis, right knee 03/28/2018  . Chronic pain of right knee 03/28/2018  . Hx of adenomatous colonic polyps 07/17/2017    Janene Harvey, PT, DPT 11/08/18 10:10 AM   [facility] [address]  [place], [place], [address] Phone: [phone]   Fax:  [phone]  Name: Kristin Benjamin MRN: [id number] Date of Birth: 1956-09-12

## 2018-11-11 ENCOUNTER — Ambulatory Visit: Payer: BLUE CROSS/BLUE SHIELD | Admitting: Physical Therapy

## 2018-11-11 ENCOUNTER — Encounter: Payer: Self-pay | Admitting: Physical Therapy

## 2018-11-11 DIAGNOSIS — R600 Localized edema: Secondary | ICD-10-CM | POA: Diagnosis not present

## 2018-11-11 DIAGNOSIS — R2689 Other abnormalities of gait and mobility: Secondary | ICD-10-CM | POA: Diagnosis not present

## 2018-11-11 DIAGNOSIS — M25662 Stiffness of left knee, not elsewhere classified: Secondary | ICD-10-CM | POA: Diagnosis not present

## 2018-11-11 DIAGNOSIS — M6281 Muscle weakness (generalized): Secondary | ICD-10-CM

## 2018-11-11 DIAGNOSIS — R262 Difficulty in walking, not elsewhere classified: Secondary | ICD-10-CM | POA: Diagnosis not present

## 2018-11-11 DIAGNOSIS — M25562 Pain in left knee: Secondary | ICD-10-CM

## 2018-11-11 NOTE — Therapy (Signed)
[facility] [address]  [place], [place], [address] Phone: [phone]   Fax:  [phone]  Physical Therapy Treatment  Patient Details  Name: Kristin Benjamin MRN: [id number] Date of Birth: Mar 31, 1956 Referring Provider (PT): Jean Rosenthal, MD   Encounter Date: 11/11/2018  PT End of Session - 11/11/18 1140    Visit Number  3    Number of Visits  12    Date for PT Re-Evaluation  12/06/18    Authorization Type  BCBS    Authorization - Number of Visits  31    PT Start Time  1051    PT Stop Time  1150    PT Time Calculation (min)  59 min    Activity Tolerance  Patient tolerated treatment well;Patient limited by pain    Behavior During Therapy  [facility] for tasks assessed/performed       Past Medical History:  Diagnosis Date  . Anxiety   . Arthritis   . Depression   . Headache    history of  . Heart murmur   . Hx of adenomatous colonic polyps 07/17/2017  . Hyperlipidemia   . Hypertension     Past Surgical History:  Procedure Laterality Date  . bladder tack    . COLONOSCOPY  2006   and 2018  . FUNCTIONAL ENDOSCOPIC SINUS SURGERY    . JOINT REPLACEMENT     Left total knee arthroplasty Dr. Ninfa Linden 10-18-18  . KNEE ARTHROSCOPY W/ MENISCAL REPAIR Right 08/2016  . TOTAL KNEE ARTHROPLASTY Left 10/18/2018   Procedure: LEFT TOTAL KNEE ARTHROPLASTY;  Surgeon: Mcarthur Rossetti, MD;  Location: [facility];  Service: Orthopedics;  Laterality: Left;  . TUBAL LIGATION  1993    There were no vitals filed for this visit.  Subjective Assessment - 11/11/18 1051    Subjective  Reports she is feeling better- finally got some sleep. Felt fine after last session. Liked the gameready but would have preferred 10 minutes rather than 15. Was able to walk 2 blocks with SPC.     Pertinent History  L TKR 10/18/18; severe R knee OA    Patient Stated Goals  Get ROM back & be able to get back to working in the yard & exercise  classes    Currently in Pain?  No/denies                       [facility] Adult PT Treatment/Exercise - 11/11/18 0001      Knee/Hip Exercises: Aerobic   Nustep  L1 x 6 min UEs/LEs      Knee/Hip Exercises: Standing   Heel Raises  Both;1 set;20 reps;Limitations    Heel Raises Limitations  at counter top; cues to avoid excessive weight through UEs    Knee Flexion  Strengthening;Left;1 set;15 reps    Knee Flexion Limitations  at counter top; cues to avoid hip flexion    Lateral Step Up  Left;1 set;10 reps;Hand Hold: 2;Step Height: 8"    Lateral Step Up Limitations  cues for upright trunk     Forward Step Up  Left;1 set;10 reps;Hand Hold: 1;Step Height: 8"    Forward Step Up Limitations  mild L trunk lean, otherwise good form    Functional Squat  1 set;10 reps;3 seconds;Limitations    Functional Squat Limitations  at counter top to tolerance      Knee/Hip Exercises: Seated   Other Seated Knee/Hip Exercises  L knee  extension/flexion with fitter with 2 blue bands x15 each way      Knee/Hip Exercises: Supine   Bridges with Diona Foley Squeeze  Strengthening;Both;1 set;10 reps    Straight Leg Raises  Strengthening;Left;10 reps;2 sets    Straight Leg Raises Limitations  1, 2#; mild quad lag      Knee/Hip Exercises: Sidelying   Hip ABduction  Strengthening;Right;Left;1 set;10 reps    Hip ABduction Limitations  1#; slight cueing for alignment    Hip ADduction  Strengthening;Right;Left;1 set;10 reps    Hip ADduction Limitations  1#; opposite LE propped on bolster   cues to maintain knee straight     Vasopneumatic   Number Minutes Vasopneumatic   10 minutes    Vasopnuematic Location   Knee   L   Vasopneumatic Pressure  Low   could not tolerate medium   Vasopneumatic Temperature   coldest      Manual Therapy   Manual Therapy  Joint mobilization    Joint Mobilization  L patellar mobilizations grade III in all directions to tolerance- mildly decreased motion superior/inferior              PT Education - 11/11/18 1139    Education Details  update to HEP and edu on self- L patellar mobilizations to tolerance    Person(s) Educated  Patient    Methods  Explanation;Demonstration;Tactile cues;Verbal cues;Handout    Comprehension  Verbalized understanding;Returned demonstration          PT Long Term Goals - 11/08/18 0927      PT LONG TERM GOAL #1   Title  Independent with ongoing/advanced HEP    Status  On-going      PT LONG TERM GOAL #2   Title  L knee AROM >/= 3-120 degrees for improved gait pattern and stair negotiation    Status  On-going      PT LONG TERM GOAL #3   Title  L hip and knee strength >/= 4+/5 for improved stability    Status  On-going      PT LONG TERM GOAL #4   Title  Patient will ambulate with normal gait pattern w/o AD for increased ease of community ambulation    Status  On-going      PT LONG TERM GOAL #5   Title  Patient with negotiate stairs reciprocally with normal step pattern to increase ease of access to master bed and bathroom    Status  On-going            Plan - 11/11/18 1142    Clinical Impression Statement  Patient arrived to session with report that she is feeling better since last session and finally got some sleep. Believes this has made a big impact because she had not had a full night's sleep in 3 weeks. Patient also reporting that she was able to walk 2 blocks with use of SPC and with supervision. Worked on progressive standing ther-ex with focus on quad and glute strengthening. Introduced anterior and lateral step ups with patient demonstrating good control and stability. Able to progress SLR and sidelying hip strengthening with weighted resistance today. Educated patient on patellar mobilizations of L LE and administered handout; mild decrease in patellar mobility in superior and inferior directions exhibited. Ended session with Gameready to L knee for pain and edema control. No complaints at end of session.      PT Treatment/Interventions  Patient/family education;Therapeutic exercise;Therapeutic activities;Functional mobility training;Gait training;Stair training;Balance training;Neuromuscular re-education;Manual techniques;Scar mobilization;Passive range of motion;Dry  needling;Taping;Joint Manipulations;Electrical Stimulation;Cryotherapy;Vasopneumatic Device;Moist Heat;ADLs/Self Care Home Management    PT Next Visit Plan  TKR protocol    Consulted and Agree with Plan of Care  Patient       Patient will benefit from skilled therapeutic intervention in order to improve the following deficits and impairments:  Pain, Decreased range of motion, Impaired flexibility, Decreased strength, Difficulty walking, Abnormal gait, Decreased activity tolerance, Decreased balance, Decreased scar mobility  Visit Diagnosis: Acute pain of left knee  Stiffness of left knee, not elsewhere classified  Muscle weakness (generalized)  Other abnormalities of gait and mobility  Difficulty in walking, not elsewhere classified  Localized edema     Problem List Patient Active Problem List   Diagnosis Date Noted  . Unilateral primary osteoarthritis, left knee 10/18/2018  . Status post total left knee replacement 10/18/2018  . Unilateral primary osteoarthritis, right knee 03/28/2018  . Chronic pain of right knee 03/28/2018  . Hx of adenomatous colonic polyps 07/17/2017    Janene Harvey, PT, DPT 11/11/18 11:50 AM   [facility] [address]  [place], [place], [address] Phone: [phone]   Fax:  [phone]  Name: Kristin Benjamin MRN: [id number] Date of Birth: 22-Aug-1956

## 2018-11-12 ENCOUNTER — Other Ambulatory Visit (INDEPENDENT_AMBULATORY_CARE_PROVIDER_SITE_OTHER): Payer: Self-pay | Admitting: Orthopaedic Surgery

## 2018-11-12 ENCOUNTER — Encounter (INDEPENDENT_AMBULATORY_CARE_PROVIDER_SITE_OTHER): Payer: Self-pay | Admitting: Orthopaedic Surgery

## 2018-11-12 MED ORDER — METHOCARBAMOL 500 MG PO TABS: 500.0000 mg | ORAL_TABLET | Freq: Four times a day (QID) | ORAL | 0 refills | Status: DC | PRN

## 2018-11-14 ENCOUNTER — Ambulatory Visit (INDEPENDENT_AMBULATORY_CARE_PROVIDER_SITE_OTHER): Payer: BLUE CROSS/BLUE SHIELD | Admitting: Physical Therapy

## 2018-11-14 ENCOUNTER — Ambulatory Visit: Payer: BLUE CROSS/BLUE SHIELD | Admitting: Physical Therapy

## 2018-11-14 DIAGNOSIS — M25562 Pain in left knee: Secondary | ICD-10-CM

## 2018-11-14 DIAGNOSIS — R2689 Other abnormalities of gait and mobility: Secondary | ICD-10-CM

## 2018-11-14 DIAGNOSIS — M25662 Stiffness of left knee, not elsewhere classified: Secondary | ICD-10-CM | POA: Diagnosis not present

## 2018-11-14 DIAGNOSIS — M6281 Muscle weakness (generalized): Secondary | ICD-10-CM

## 2018-11-14 NOTE — Therapy (Signed)
[facility] [place], [place], [address] Phone: [phone]   Fax:  [phone]  Physical Therapy Treatment  Patient Details  Name: Kristin Benjamin MRN: [id number] Date of Birth: 1956-02-10 Referring Provider (PT): Jean Rosenthal, MD   Encounter Date: 11/14/2018  PT End of Session - 11/14/18 1103    Visit Number  4    Number of Visits  12    Date for PT Re-Evaluation  12/06/18    Authorization Type  BCBS    Authorization - Number of Visits  31    PT Start Time  1018    PT Stop Time  1111    PT Time Calculation (min)  53 min    Activity Tolerance  Patient tolerated treatment well    Behavior During Therapy  [facility] for tasks assessed/performed       Past Medical History:  Diagnosis Date  . Anxiety   . Arthritis   . Depression   . Headache    history of  . Heart murmur   . Hx of adenomatous colonic polyps 07/17/2017  . Hyperlipidemia   . Hypertension     Past Surgical History:  Procedure Laterality Date  . bladder tack    . COLONOSCOPY  2006   and 2018  . FUNCTIONAL ENDOSCOPIC SINUS SURGERY    . JOINT REPLACEMENT     Left total knee arthroplasty Dr. Ninfa Linden 10-18-18  . KNEE ARTHROSCOPY W/ MENISCAL REPAIR Right 08/2016  . TOTAL KNEE ARTHROPLASTY Left 10/18/2018   Procedure: LEFT TOTAL KNEE ARTHROPLASTY;  Surgeon: Mcarthur Rossetti, MD;  Location: [facility];  Service: Orthopedics;  Laterality: Left;  . TUBAL LIGATION  1993    There were no vitals filed for this visit.  Subjective Assessment - 11/14/18 1025    Subjective  Pt reports she has had better sleep the last 4 nights.  Otherwise no new changes.     Patient Stated Goals  Get ROM back & be able to get back to working in the yard & exercise classes    Currently in Pain?  No/denies    Pain Score  0-No pain         [facility] PT Assessment - 11/14/18 0001      Assessment   Medical Diagnosis  L TKR    Referring Provider (PT)   Jean Rosenthal, MD    Onset Date/Surgical Date  10/18/18    Next MD Visit  11/28/18    Prior Therapy  [facility] PT x 2 weeks      AROM   Left Knee Flexion  80      Flexibility   Quadriceps  Lt - 68 deg         [facility] Adult PT Treatment/Exercise - 11/14/18 0001      Knee/Hip Exercises: Stretches   Passive Hamstring Stretch  Left;2 reps;30 seconds   seated with overpressure from pt.    Quad Stretch  Left;30 seconds;2 reps   pool noodle above knee    Quad Stretch Limitations  prone with strap    Gastroc Stretch  Left;Right;3 reps;30 seconds      Knee/Hip Exercises: Aerobic   Recumbent Bike  partial revolutions x 8 min with PTA present to monitor and discuss progress.       Knee/Hip Exercises: Standing   Lateral Step Up  Left;1 set;10 reps;Hand Hold: 2;Step Height: 6"    Forward Step Up  Left;1 set;15 reps;Hand Hold: 2;Step Height: 6"  Step Down  Right;1 set;10 reps;Hand Hold: 2;Step Height: 4"   and retro step up with LLE   SLS  Lt/Rt SLS x 30 sec x 2 reps each leg     Gait Training  trials of 80 ft with cues for increased heel strike, increased knee flexion during swing phase and increased toe off; improved gait quality with increased distance - 4 trials.       Knee/Hip Exercises: Seated   Other Seated Knee/Hip Exercises  Seated scoots with 5-10 sec hold x 8 reps.     Sit to Sand  1 set;10 reps;without UE support   left foot a little forward, cues for form.     Vasopneumatic   Number Minutes Vasopneumatic   15 minutes    Vasopnuematic Location   Knee   L   Vasopneumatic Pressure  Low   could not tolerate medium   Vasopneumatic Temperature   34 deg         PT Long Term Goals - 11/08/18 0927      PT LONG TERM GOAL #1   Title  Independent with ongoing/advanced HEP    Status  On-going      PT LONG TERM GOAL #2   Title  L knee AROM >/= 3-120 degrees for improved gait pattern and stair negotiation    Status  On-going      PT LONG TERM GOAL #3   Title  L hip and  knee strength >/= 4+/5 for improved stability    Status  On-going      PT LONG TERM GOAL #4   Title  Patient will ambulate with normal gait pattern w/o AD for increased ease of community ambulation    Status  On-going      PT LONG TERM GOAL #5   Title  Patient with negotiate stairs reciprocally with normal step pattern to increase ease of access to master bed and bathroom    Status  On-going            Plan - 11/14/18 1100    Clinical Impression Statement  Pt tolerated all exercises well, with mild increase in pain with flexion exercises. Her gait quality improved with short gait trials with cues.  Pt Progressing towards goals with gradual improvement in ROM and functional strength.     Rehab Potential  Excellent    PT Frequency  3x / week    PT Duration  4 weeks    PT Treatment/Interventions  Patient/family education;Therapeutic exercise;Therapeutic activities;Functional mobility training;Gait training;Stair training;Balance training;Neuromuscular re-education;Manual techniques;Scar mobilization;Passive range of motion;Dry needling;Taping;Joint Manipulations;Electrical Stimulation;Cryotherapy;Vasopneumatic Device;Moist Heat;ADLs/Self Care Home Management    PT Next Visit Plan  continue progressive ROM/strengthening for Lt knee. Trial of Kinesiotape.     Consulted and Agree with Plan of Care  Patient       Patient will benefit from skilled therapeutic intervention in order to improve the following deficits and impairments:  Pain, Decreased range of motion, Impaired flexibility, Decreased strength, Difficulty walking, Abnormal gait, Decreased activity tolerance, Decreased balance, Decreased scar mobility  Visit Diagnosis: Acute pain of left knee  Stiffness of left knee, not elsewhere classified  Muscle weakness (generalized)  Other abnormalities of gait and mobility     Problem List Patient Active Problem List   Diagnosis Date Noted  . Unilateral primary osteoarthritis,  left knee 10/18/2018  . Status post total left knee replacement 10/18/2018  . Unilateral primary osteoarthritis, right knee 03/28/2018  . Chronic pain of right knee 03/28/2018  .  Hx of adenomatous colonic polyps 07/17/2017   Kerin Perna, PTA 11/14/18 11:07 AM  [facility] [place], [place], [address] Phone: [phone]   Fax:  [phone]  Name: Kristin Benjamin MRN: [id number] Date of Birth: Mar 13, 1956

## 2018-11-15 ENCOUNTER — Encounter (INDEPENDENT_AMBULATORY_CARE_PROVIDER_SITE_OTHER): Payer: Self-pay | Admitting: Orthopaedic Surgery

## 2018-11-15 ENCOUNTER — Ambulatory Visit (INDEPENDENT_AMBULATORY_CARE_PROVIDER_SITE_OTHER): Payer: BLUE CROSS/BLUE SHIELD | Admitting: Physical Therapy

## 2018-11-15 ENCOUNTER — Encounter: Payer: BLUE CROSS/BLUE SHIELD | Admitting: Physical Therapy

## 2018-11-15 DIAGNOSIS — R2689 Other abnormalities of gait and mobility: Secondary | ICD-10-CM

## 2018-11-15 DIAGNOSIS — M25562 Pain in left knee: Secondary | ICD-10-CM

## 2018-11-15 DIAGNOSIS — M25662 Stiffness of left knee, not elsewhere classified: Secondary | ICD-10-CM

## 2018-11-15 DIAGNOSIS — M6281 Muscle weakness (generalized): Secondary | ICD-10-CM | POA: Diagnosis not present

## 2018-11-15 NOTE — Therapy (Signed)
[facility] [place], [place], [address] Phone: [phone]   Fax:  [phone]  Physical Therapy Treatment  Patient Details  Name: Kristin Benjamin MRN: [id number] Date of Birth: Jan 19, 1956 Referring Provider (PT): Jean Rosenthal, MD   Encounter Date: 11/15/2018  PT End of Session - 11/15/18 0940    Visit Number  5    Number of Visits  12    Date for PT Re-Evaluation  12/06/18    Authorization Type  BCBS    Authorization - Number of Visits  31    PT Start Time  0930    PT Stop Time  1029    PT Time Calculation (min)  59 min    Activity Tolerance  Patient tolerated treatment well    Behavior During Therapy  [facility] for tasks assessed/performed       Past Medical History:  Diagnosis Date  . Anxiety   . Arthritis   . Depression   . Headache    history of  . Heart murmur   . Hx of adenomatous colonic polyps 07/17/2017  . Hyperlipidemia   . Hypertension     Past Surgical History:  Procedure Laterality Date  . bladder tack    . COLONOSCOPY  2006   and 2018  . FUNCTIONAL ENDOSCOPIC SINUS SURGERY    . JOINT REPLACEMENT     Left total knee arthroplasty Dr. Ninfa Linden 10-18-18  . KNEE ARTHROSCOPY W/ MENISCAL REPAIR Right 08/2016  . TOTAL KNEE ARTHROPLASTY Left 10/18/2018   Procedure: LEFT TOTAL KNEE ARTHROPLASTY;  Surgeon: Mcarthur Rossetti, MD;  Location: [facility];  Service: Orthopedics;  Laterality: Left;  . TUBAL LIGATION  1993    There were no vitals filed for this visit.  Subjective Assessment - 11/15/18 1305    Subjective  Pt reports she feels like she is walking so much better since yesterday's visit. Pt states she took pain medication prior to session.     Patient Stated Goals  Get ROM back & be able to get back to working in the yard & exercise classes    Currently in Pain?  No/denies    Pain Score  0-No pain   just tight.         [facility] PT Assessment - 11/15/18 0001      Assessment   Medical Diagnosis  L TKR    Referring Provider (PT)  Jean Rosenthal, MD    Onset Date/Surgical Date  10/18/18    Next MD Visit  11/28/18    Prior Therapy  [facility] PT x 2 weeks      PROM   Left Knee Flexion  88      Flexibility   Quadriceps  Lt 78 deg        [facility] Adult PT Treatment/Exercise - 11/15/18 0001      Knee/Hip Exercises: Stretches   Passive Hamstring Stretch  Left;1 rep;30 seconds    Quad Stretch  Left;30 seconds;4 reps   pool noodle above knee    Gastroc Stretch  Left;Right;3 reps;30 seconds   heels off of step     Knee/Hip Exercises: Aerobic   Recumbent Bike  partial revolutions x 6 min with PTA present to monitor and discuss progress.     Other Aerobic  single laps around gym in between exercises to decrease stiffness       Knee/Hip Exercises: Standing   Heel Raises  Both;1 set;20 reps    Lateral Step Up  Left;1  set;10 reps;Hand Hold: 2;Step Height: 6"    Forward Step Up  Left;1 set;Hand Hold: 2;Step Height: 6";10 reps   slow eccentric control with focus on TKE on return     Knee/Hip Exercises: Seated   Other Seated Knee/Hip Exercises  Seated scoots with 5-10 sec hold x 8 reps.     Sit to Sand  1 set;10 reps;without UE support   left foot a little forward, cues for form.     Knee/Hip Exercises: Supine   Heel Slides  AAROM;Left;1 set;5 reps   strap assist     Knee/Hip Exercises: Prone   Other Prone Exercises  Lt TKE with toes tucked x 5 sec hold x 10 reps       Vasopneumatic   Number Minutes Vasopneumatic   15 minutes    Vasopnuematic Location   Knee   L   Vasopneumatic Pressure  Low   could not tolerate medium   Vasopneumatic Temperature   34 deg      Manual Therapy   Manual Therapy  Soft tissue mobilization    Manual therapy comments  2- squid shaped pieces of sensitive skin rock tape applied to medial/lateral Lt knee (not touching incision) to decrease edema, decompress tissue, increase proprioception.     Soft tissue mobilization   IASTM to Lt quad to decrease fascial tightness and improve ROM.                   PT Long Term Goals - 11/08/18 0927      PT LONG TERM GOAL #1   Title  Independent with ongoing/advanced HEP    Status  On-going      PT LONG TERM GOAL #2   Title  L knee AROM >/= 3-120 degrees for improved gait pattern and stair negotiation    Status  On-going      PT LONG TERM GOAL #3   Title  L hip and knee strength >/= 4+/5 for improved stability    Status  On-going      PT LONG TERM GOAL #4   Title  Patient will ambulate with normal gait pattern w/o AD for increased ease of community ambulation    Status  On-going      PT LONG TERM GOAL #5   Title  Patient with negotiate stairs reciprocally with normal step pattern to increase ease of access to master bed and bathroom    Status  On-going            Plan - 11/15/18 1308    Clinical Impression Statement  Pt's Lt knee ROM progressing well.  Gait quality observed to be much improved from yesterday's visit.  She tolerated all exercises well, with mild increase in pain.  Pain reduced with use of vaso at end of session.     Rehab Potential  Excellent    PT Frequency  3x / week    PT Duration  4 weeks    PT Treatment/Interventions  Patient/family education;Therapeutic exercise;Therapeutic activities;Functional mobility training;Gait training;Stair training;Balance training;Neuromuscular re-education;Manual techniques;Scar mobilization;Passive range of motion;Dry needling;Taping;Joint Manipulations;Electrical Stimulation;Cryotherapy;Vasopneumatic Device;Moist Heat;ADLs/Self Care Home Management    PT Next Visit Plan  continue progressive ROM/strengthening for Lt knee. assess response to tape.     Consulted and Agree with Plan of Care  Patient       Patient will benefit from skilled therapeutic intervention in order to improve the following deficits and impairments:  Pain, Decreased range of motion, Impaired flexibility, Decreased  strength, Difficulty  walking, Abnormal gait, Decreased activity tolerance, Decreased balance, Decreased scar mobility  Visit Diagnosis: Acute pain of left knee  Stiffness of left knee, not elsewhere classified  Muscle weakness (generalized)  Other abnormalities of gait and mobility     Problem List Patient Active Problem List   Diagnosis Date Noted  . Unilateral primary osteoarthritis, left knee 10/18/2018  . Status post total left knee replacement 10/18/2018  . Unilateral primary osteoarthritis, right knee 03/28/2018  . Chronic pain of right knee 03/28/2018  . Hx of adenomatous colonic polyps 07/17/2017   Kerin Perna, PTA 11/15/18 1:10 PM  [facility] [place], [place], [address] Phone: [phone]   Fax:  [phone]  Name: Kristin Benjamin MRN: [id number] Date of Birth: [birth year]

## 2018-11-18 ENCOUNTER — Encounter: Payer: Self-pay | Admitting: Physical Therapy

## 2018-11-18 ENCOUNTER — Encounter: Payer: BLUE CROSS/BLUE SHIELD | Admitting: Physical Therapy

## 2018-11-18 ENCOUNTER — Ambulatory Visit (INDEPENDENT_AMBULATORY_CARE_PROVIDER_SITE_OTHER): Payer: BLUE CROSS/BLUE SHIELD | Admitting: Physical Therapy

## 2018-11-18 DIAGNOSIS — M25562 Pain in left knee: Secondary | ICD-10-CM

## 2018-11-18 DIAGNOSIS — M25662 Stiffness of left knee, not elsewhere classified: Secondary | ICD-10-CM

## 2018-11-18 DIAGNOSIS — R2689 Other abnormalities of gait and mobility: Secondary | ICD-10-CM | POA: Diagnosis not present

## 2018-11-18 DIAGNOSIS — M6281 Muscle weakness (generalized): Secondary | ICD-10-CM | POA: Diagnosis not present

## 2018-11-18 NOTE — Patient Instructions (Signed)
Quadriceps Set (Prone) - a variation    With toes supporting lower legs, tighten thigh muscles to straighten knees. Hold __10__ seconds. Relax. Repeat _5-10___ times per set. Do __1__ sets per session.

## 2018-11-18 NOTE — Therapy (Signed)
[place], [place], [address] Phone: [phone]   Fax:  [phone]  Physical Therapy Treatment  Patient Details  Name: Kristin Benjamin MRN: [id number] Date of Birth: 19-Nov-1955 Referring Provider (PT): Jean Rosenthal, MD   Encounter Date: 11/18/2018  PT End of Session - 11/18/18 0933    Visit Number  6    Number of Visits  12    Date for PT Re-Evaluation  12/06/18    Authorization Type  BCBS    Authorization - Number of Visits  31    PT Start Time  0927    PT Stop Time  1028    PT Time Calculation (min)  61 min    Activity Tolerance  Patient tolerated treatment well    Behavior During Therapy  [facility] for tasks assessed/performed       Past Medical History:  Diagnosis Date  . Anxiety   . Arthritis   . Depression   . Headache    history of  . Heart murmur   . Hx of adenomatous colonic polyps 07/17/2017  . Hyperlipidemia   . Hypertension     Past Surgical History:  Procedure Laterality Date  . bladder tack    . COLONOSCOPY  2006   and 2018  . FUNCTIONAL ENDOSCOPIC SINUS SURGERY    . JOINT REPLACEMENT     Left total knee arthroplasty Dr. Ninfa Linden 10-18-18  . KNEE ARTHROSCOPY W/ MENISCAL REPAIR Right 08/2016  . TOTAL KNEE ARTHROPLASTY Left 10/18/2018   Procedure: LEFT TOTAL KNEE ARTHROPLASTY;  Surgeon: Mcarthur Rossetti, MD;  Location: [facility];  Service: Orthopedics;  Laterality: Left;  . TUBAL LIGATION  1993    There were no vitals filed for this visit.  Subjective Assessment - 11/18/18 0933    Subjective  Pt reports she has ordered a recumbent stationary bike and picks it up today.  She has been having "zingers" in the night.   She has been wearing compression socks.  She feels like that and the [place] are helping.     Patient Stated Goals  Get ROM back & be able to get back to working in the yard & exercise classes    Currently in Pain?  No/denies    Pain Score  0-No pain          [facility] PT Assessment - 11/18/18 0001      Assessment   Medical Diagnosis  L TKR    Referring Provider (PT)  Jean Rosenthal, MD    Onset Date/Surgical Date  10/18/18    Next MD Visit  11/28/18    Prior Therapy  [facility] PT x 2 weeks      PROM   Left Knee Extension  -3    Left Knee Flexion  90      Flexibility   Quadriceps  Lt 87 deg         [facility] Adult PT Treatment/Exercise - 11/18/18 0001      Knee/Hip Exercises: Stretches   Passive Hamstring Stretch  --    Sports administrator  Left;30 seconds;4 reps    Quad Stretch Limitations  prone with strap    Other Knee/Hip Stretches  forward lunge on 12" step with Lt foot (for increased Lt knee flexion) x 10 sec, followed by Lt hamstring stretch x 10 reps       Knee/Hip Exercises: Aerobic   Recumbent Bike  partial revolutions x 7 min with PTA present to monitor and  discuss progress.     Other Aerobic  single laps around gym in between exercises to decrease stiffness       Knee/Hip Exercises: Seated   Long Arc Quad  Left;5 reps    Other Seated Knee/Hip Exercises  Seated scoots with 5-10 sec hold x 8 reps.     Sit to Sand  1 set;without UE support;15 reps   feet even, cues for form.     Knee/Hip Exercises: Supine   Straight Leg Raises  Strengthening;Left;1 set;10 reps   slow reps     Knee/Hip Exercises: Prone   Other Prone Exercises  Lt TKE with toes tucked x 5 sec hold x 10 reps    improved form     Vasopneumatic   Number Minutes Vasopneumatic   15 minutes    Vasopnuematic Location   Knee   L   Vasopneumatic Pressure  Medium   could not tolerate medium   Vasopneumatic Temperature   34 deg      Manual Therapy   Manual Therapy  Soft tissue mobilization;Passive ROM    Manual therapy comments  2- squid shaped pieces of sensitive skin rock tape applied to medial/lateral Lt knee (not touching incision) to decrease edema, decompress tissue, increase proprioception.     Joint Mobilization  L patellar mobilizations  superior/inferior    Soft tissue mobilization  IASTM to Lt quad to decrease fascial tightness and improve ROM.     Passive ROM  Overpressure into Lt knee ext, 30 sec holds.              PT Education - 11/18/18 1026    Education Details  HEP     Person(s) Educated  Patient    Methods  Explanation;Handout    Comprehension  Verbalized understanding          PT Long Term Goals - 11/08/18 0927      PT LONG TERM GOAL #1   Title  Independent with ongoing/advanced HEP    Status  On-going      PT LONG TERM GOAL #2   Title  L knee AROM >/= 3-120 degrees for improved gait pattern and stair negotiation    Status  On-going      PT LONG TERM GOAL #3   Title  L hip and knee strength >/= 4+/5 for improved stability    Status  On-going      PT LONG TERM GOAL #4   Title  Patient will ambulate with normal gait pattern w/o AD for increased ease of community ambulation    Status  On-going      PT LONG TERM GOAL #5   Title  Patient with negotiate stairs reciprocally with normal step pattern to increase ease of access to master bed and bathroom    Status  On-going            Plan - 11/18/18 1035    Clinical Impression Statement  Gradual gains in Lt knee flexion ROM.  Pt able to perform sit to/from stand with feet even today.  Good response to tape, no irritation. Pt shown many variations in knee flex/ext ROM exercises to try at home.  Pt tolerated all exercises with mild increase in pain; reduced with use of vaso at end of session (tolerated medium pressure fine).  Pt is progressing towards goals and will benefit from continued PT intervention to maximize functional mobility.     Rehab Potential  Excellent    PT Frequency  3x /  week    PT Duration  4 weeks    PT Treatment/Interventions  Patient/family education;Therapeutic exercise;Therapeutic activities;Functional mobility training;Gait training;Stair training;Balance training;Neuromuscular re-education;Manual techniques;Scar  mobilization;Passive range of motion;Dry needling;Taping;Joint Manipulations;Electrical Stimulation;Cryotherapy;Vasopneumatic Device;Moist Heat;ADLs/Self Care Home Management    PT Next Visit Plan  continue progressive ROM/strengthening for Lt knee.     Consulted and Agree with Plan of Care  Patient       Patient will benefit from skilled therapeutic intervention in order to improve the following deficits and impairments:  Pain, Decreased range of motion, Impaired flexibility, Decreased strength, Difficulty walking, Abnormal gait, Decreased activity tolerance, Decreased balance, Decreased scar mobility  Visit Diagnosis: Acute pain of left knee  Stiffness of left knee, not elsewhere classified  Muscle weakness (generalized)  Other abnormalities of gait and mobility     Problem List Patient Active Problem List   Diagnosis Date Noted  . Unilateral primary osteoarthritis, left knee 10/18/2018  . Status post total left knee replacement 10/18/2018  . Unilateral primary osteoarthritis, right knee 03/28/2018  . Chronic pain of right knee 03/28/2018  . Hx of adenomatous colonic polyps 07/17/2017   Kerin Perna, PTA 11/18/18 10:40 AM  [facility] [place], [place], [address] Phone: [phone]   Fax:  [phone]  Name: Kristin Benjamin MRN: [id number] Date of Birth: 1956/07/10

## 2018-11-20 ENCOUNTER — Encounter: Payer: BLUE CROSS/BLUE SHIELD | Admitting: Physical Therapy

## 2018-11-20 ENCOUNTER — Encounter: Payer: Self-pay | Admitting: Physical Therapy

## 2018-11-20 ENCOUNTER — Ambulatory Visit (INDEPENDENT_AMBULATORY_CARE_PROVIDER_SITE_OTHER): Payer: BLUE CROSS/BLUE SHIELD | Admitting: Physical Therapy

## 2018-11-20 DIAGNOSIS — M25662 Stiffness of left knee, not elsewhere classified: Secondary | ICD-10-CM | POA: Diagnosis not present

## 2018-11-20 DIAGNOSIS — M6281 Muscle weakness (generalized): Secondary | ICD-10-CM | POA: Diagnosis not present

## 2018-11-20 DIAGNOSIS — R262 Difficulty in walking, not elsewhere classified: Secondary | ICD-10-CM

## 2018-11-20 DIAGNOSIS — M25562 Pain in left knee: Secondary | ICD-10-CM | POA: Diagnosis not present

## 2018-11-20 DIAGNOSIS — R2689 Other abnormalities of gait and mobility: Secondary | ICD-10-CM | POA: Diagnosis not present

## 2018-11-20 DIAGNOSIS — R600 Localized edema: Secondary | ICD-10-CM

## 2018-11-20 NOTE — Therapy (Signed)
[place], [place], [address] Phone: [phone]   Fax:  [phone]  Physical Therapy Treatment  Patient Details  Name: Kristin Benjamin MRN: [id number] Date of Birth: 07/14/1956 Referring Provider (PT): Jean Rosenthal, MD   Encounter Date: 11/20/2018  PT End of Session - 11/20/18 1017    Visit Number  7    Number of Visits  12    Date for PT Re-Evaluation  12/06/18    Authorization Type  BCBS    Authorization - Number of Visits  31    PT Start Time  0930    PT Stop Time  1025    PT Time Calculation (min)  55 min    Activity Tolerance  Patient tolerated treatment well    Behavior During Therapy  [facility] for tasks assessed/performed       Past Medical History:  Diagnosis Date  . Anxiety   . Arthritis   . Depression   . Headache    history of  . Heart murmur   . Hx of adenomatous colonic polyps 07/17/2017  . Hyperlipidemia   . Hypertension     Past Surgical History:  Procedure Laterality Date  . bladder tack    . COLONOSCOPY  2006   and 2018  . FUNCTIONAL ENDOSCOPIC SINUS SURGERY    . JOINT REPLACEMENT     Left total knee arthroplasty Dr. Ninfa Linden 10-18-18  . KNEE ARTHROSCOPY W/ MENISCAL REPAIR Right 08/2016  . TOTAL KNEE ARTHROPLASTY Left 10/18/2018   Procedure: LEFT TOTAL KNEE ARTHROPLASTY;  Surgeon: Mcarthur Rossetti, MD;  Location: [facility];  Service: Orthopedics;  Laterality: Left;  . TUBAL LIGATION  1993    There were no vitals filed for this visit.  Subjective Assessment - 11/20/18 0932    Subjective  doing better; knee is stiff    Pertinent History  L TKR 10/18/18; severe R knee OA    Patient Stated Goals  Get ROM back & be able to get back to working in the yard & exercise classes    Currently in Pain?  No/denies         [facility] PT Assessment - 11/20/18 1016      Assessment   Medical Diagnosis  L TKR    Referring Provider (PT)  Jean Rosenthal, MD    Onset Date/Surgical Date  10/18/18      AROM   Left Knee Flexion  92      PROM   Left Knee Flexion  100                   [facility] Adult PT Treatment/Exercise - 11/20/18 0933      Knee/Hip Exercises: Aerobic   Recumbent Bike  partial to full revolutions x 8 min with PT present to monitor and discuss progress    Other Aerobic  single laps around gym in between exercises to decrease stiffness       Knee/Hip Exercises: Seated   Other Seated Knee/Hip Exercises  Seated scoots with 5-10 sec hold x 8 reps.       Knee/Hip Exercises: Supine   Quad Sets  Left;10 reps    Quad Sets Limitations  heel propped    Heel Prop for Knee Extension  4 minutes    Straight Leg Raises  Strengthening;Left;1 set;10 reps   slow reps     Vasopneumatic   Number Minutes Vasopneumatic   15 minutes    Vasopnuematic Location  Knee    Vasopneumatic Pressure  Medium    Vasopneumatic Temperature   34 deg      Manual Therapy   Joint Mobilization  L patellar mobilizations superior/inferior; gentle grades 2-3 A/P femur glides    Soft tissue mobilization  IASTM to Lt quad to decrease fascial tightness and improve ROM.     Passive ROM  Overpressure into Lt knee ext, 30 sec holds. Lt knee flexion seated with contract relax                  PT Long Term Goals - 11/08/18 0927      PT LONG TERM GOAL #1   Title  Independent with ongoing/advanced HEP    Status  On-going      PT LONG TERM GOAL #2   Title  L knee AROM >/= 3-120 degrees for improved gait pattern and stair negotiation    Status  On-going      PT LONG TERM GOAL #3   Title  L hip and knee strength >/= 4+/5 for improved stability    Status  On-going      PT LONG TERM GOAL #4   Title  Patient will ambulate with normal gait pattern w/o AD for increased ease of community ambulation    Status  On-going      PT LONG TERM GOAL #5   Title  Patient with negotiate stairs reciprocally with normal step pattern to increase ease of access  to master bed and bathroom    Status  On-going            Plan - 11/20/18 1017    Clinical Impression Statement  Pt continues to make gradual improvements in Lt knee flexion, both active and passively.  Will continue to benefit from PT to maximize function.  Progressing well with PT, still with gait deviations, ROM and strength limtations affecting functional mobility.    Rehab Potential  Excellent    PT Frequency  3x / week    PT Duration  4 weeks    PT Treatment/Interventions  Patient/family education;Therapeutic exercise;Therapeutic activities;Functional mobility training;Gait training;Stair training;Balance training;Neuromuscular re-education;Manual techniques;Scar mobilization;Passive range of motion;Dry needling;Taping;Joint Manipulations;Electrical Stimulation;Cryotherapy;Vasopneumatic Device;Moist Heat;ADLs/Self Care Home Management    PT Next Visit Plan  continue progressive ROM/strengthening for Lt knee.     Consulted and Agree with Plan of Care  Patient       Patient will benefit from skilled therapeutic intervention in order to improve the following deficits and impairments:  Pain, Decreased range of motion, Impaired flexibility, Decreased strength, Difficulty walking, Abnormal gait, Decreased activity tolerance, Decreased balance, Decreased scar mobility  Visit Diagnosis: Acute pain of left knee  Stiffness of left knee, not elsewhere classified  Muscle weakness (generalized)  Other abnormalities of gait and mobility  Difficulty in walking, not elsewhere classified  Localized edema     Problem List Patient Active Problem List   Diagnosis Date Noted  . Unilateral primary osteoarthritis, left knee 10/18/2018  . Status post total left knee replacement 10/18/2018  . Unilateral primary osteoarthritis, right knee 03/28/2018  . Chronic pain of right knee 03/28/2018  . Hx of adenomatous colonic polyps 07/17/2017      Laureen Abrahams, PT, DPT 11/20/18 10:19  AM     [facility] [place], [place], [address] Phone: [phone]   Fax:  [phone]  Name: Kristin Benjamin MRN: [id number] Date of Birth: Apr 28, 1956

## 2018-11-21 ENCOUNTER — Encounter (INDEPENDENT_AMBULATORY_CARE_PROVIDER_SITE_OTHER): Payer: Self-pay | Admitting: Orthopaedic Surgery

## 2018-11-22 ENCOUNTER — Encounter: Payer: Self-pay | Admitting: Physical Therapy

## 2018-11-22 ENCOUNTER — Encounter: Payer: BLUE CROSS/BLUE SHIELD | Admitting: Physical Therapy

## 2018-11-22 ENCOUNTER — Ambulatory Visit (INDEPENDENT_AMBULATORY_CARE_PROVIDER_SITE_OTHER): Payer: BLUE CROSS/BLUE SHIELD | Admitting: Physical Therapy

## 2018-11-22 DIAGNOSIS — M25562 Pain in left knee: Secondary | ICD-10-CM

## 2018-11-22 DIAGNOSIS — M25662 Stiffness of left knee, not elsewhere classified: Secondary | ICD-10-CM | POA: Diagnosis not present

## 2018-11-22 DIAGNOSIS — M6281 Muscle weakness (generalized): Secondary | ICD-10-CM | POA: Diagnosis not present

## 2018-11-22 NOTE — Therapy (Signed)
[place], [place], [address] Phone: [phone]   Fax:  [phone]  Physical Therapy Treatment  Patient Details  Name: Kristin Benjamin MRN: [id number] Date of Birth: 09/16/1956 Referring Provider (PT): Jean Rosenthal, MD   Encounter Date: 11/22/2018  PT End of Session - 11/22/18 0933    Visit Number  8    Number of Visits  12    Date for PT Re-Evaluation  12/06/18    Authorization Type  BCBS    PT Start Time  0930    PT Stop Time  1027    PT Time Calculation (min)  57 min    Activity Tolerance  Patient tolerated treatment well    Behavior During Therapy  [facility] for tasks assessed/performed       Past Medical History:  Diagnosis Date  . Anxiety   . Arthritis   . Depression   . Headache    history of  . Heart murmur   . Hx of adenomatous colonic polyps 07/17/2017  . Hyperlipidemia   . Hypertension     Past Surgical History:  Procedure Laterality Date  . bladder tack    . COLONOSCOPY  2006   and 2018  . FUNCTIONAL ENDOSCOPIC SINUS SURGERY    . JOINT REPLACEMENT     Left total knee arthroplasty Dr. Ninfa Linden 10-18-18  . KNEE ARTHROSCOPY W/ MENISCAL REPAIR Right 08/2016  . TOTAL KNEE ARTHROPLASTY Left 10/18/2018   Procedure: LEFT TOTAL KNEE ARTHROPLASTY;  Surgeon: Mcarthur Rossetti, MD;  Location: [facility];  Service: Orthopedics;  Laterality: Left;  . TUBAL LIGATION  1993    There were no vitals filed for this visit.  Subjective Assessment - 11/22/18 0933    Subjective  "I don't hate heel slides like I used to".  Pt reports she is stretching in mornings prior to therapy and this has helped ease the stiffness.     Patient Stated Goals  Get ROM back & be able to get back to working in the yard & exercise classes    Currently in Pain?  No/denies    Pain Score  0-No pain         [facility] PT Assessment - 11/22/18 0001      Assessment   Medical Diagnosis  L TKR    Referring  Provider (PT)  Jean Rosenthal, MD    Onset Date/Surgical Date  10/18/18    Next MD Visit  11/28/18    Prior Therapy  [facility] PT x 2 weeks                   [facility] Adult PT Treatment/Exercise - 11/22/18 0001      Knee/Hip Exercises: Stretches   Gastroc Stretch  Both;2 reps;30 seconds   incline board   Other Knee/Hip Stretches  forward lunge for ROM with foot on back of truck (23" height) holding 20 sec x 5 reps    Lt knee flexion up to 101 deg     Knee/Hip Exercises: Aerobic   Recumbent Bike  partial to slow full revolutions x 10 min with PTA present to monitor and discuss progress   able to turn bicycle on last 2 min    Other Aerobic  single laps around gym in between exercises to decrease stiffness       Knee/Hip Exercises: Standing   Lateral Step Up  Left;1 set;10 reps;Hand Hold: 1;Step Height: 8"    Forward Step Up  Left;1  set;10 reps;Hand Hold: 1;Step Height: 8"    SLS  Lt SLS on bosu x 20 sec x 2 reps, repeated on RLE      Knee/Hip Exercises: Seated   Sit to Sand  1 set;10 reps;without UE support   Rt foot forward     Knee/Hip Exercises: Prone   Hamstring Curl  1 set;10 reps   2#, LLE   Prone Knee Hang  1 minute   2 reps     Vasopneumatic   Number Minutes Vasopneumatic   15 minutes    Vasopnuematic Location   Knee    Vasopneumatic Pressure  Medium    Vasopneumatic Temperature   34 deg      Manual Therapy   Soft tissue mobilization  IASTM and STM to Lt hamstring to decrease fascial tightness and improve ROM          PT Long Term Goals - 11/22/18 1002      PT LONG TERM GOAL #1   Title  Independent with ongoing/advanced HEP    Status  On-going      PT LONG TERM GOAL #2   Title  L knee AROM >/= 3-120 degrees for improved gait pattern and stair negotiation    Status  On-going      PT LONG TERM GOAL #3   Title  L hip and knee strength >/= 4+/5 for improved stability    Status  On-going      PT LONG TERM GOAL #4   Title  Patient will ambulate  with normal gait pattern w/o AD for increased ease of community ambulation    Status  On-going      PT LONG TERM GOAL #5   Title  Patient with negotiate stairs reciprocally with normal step pattern to increase ease of access to master bed and bathroom    Status  Achieved            Plan - 11/22/18 0959    Clinical Impression Statement  Pt continues to make gradual improvements in Lt knee flexion ROM.  She has met LTG#5 with ability to ascend/descend stairs reciprocally.  Pt tolerated all exercises well with mild increase in pain.   Will continue to benefit from PT to maximize function. Progressing well with PT, still with gait deviations, ROM and strength limtations affecting functional mobility.     Rehab Potential  Excellent    PT Frequency  3x / week    PT Duration  4 weeks    PT Treatment/Interventions  Patient/family education;Therapeutic exercise;Therapeutic activities;Functional mobility training;Gait training;Stair training;Balance training;Neuromuscular re-education;Manual techniques;Scar mobilization;Passive range of motion;Dry needling;Taping;Joint Manipulations;Electrical Stimulation;Cryotherapy;Vasopneumatic Device;Moist Heat;ADLs/Self Care Home Management    PT Next Visit Plan  continue progressive ROM/strengthening for Lt knee.   Measure strength of LLE.      Consulted and Agree with Plan of Care  Patient       Patient will benefit from skilled therapeutic intervention in order to improve the following deficits and impairments:  Pain, Decreased range of motion, Impaired flexibility, Decreased strength, Difficulty walking, Abnormal gait, Decreased activity tolerance, Decreased balance, Decreased scar mobility  Visit Diagnosis: Acute pain of left knee  Stiffness of left knee, not elsewhere classified  Muscle weakness (generalized)     Problem List Patient Active Problem List   Diagnosis Date Noted  . Unilateral primary osteoarthritis, left knee 10/18/2018  .  Status post total left knee replacement 10/18/2018  . Unilateral primary osteoarthritis, right knee 03/28/2018  . Chronic pain of  right knee 03/28/2018  . Hx of adenomatous colonic polyps 07/17/2017   Kerin Perna, PTA 11/22/18 10:18 AM  [place], [place], [address] Phone: [phone]   Fax:  [phone]  Name: CLOEY SFERRAZZA MRN: [id number] Date of Birth: 1956/11/11

## 2018-11-25 ENCOUNTER — Encounter: Payer: BLUE CROSS/BLUE SHIELD | Admitting: Physical Therapy

## 2018-11-25 ENCOUNTER — Ambulatory Visit (INDEPENDENT_AMBULATORY_CARE_PROVIDER_SITE_OTHER): Payer: BLUE CROSS/BLUE SHIELD | Admitting: Physical Therapy

## 2018-11-25 ENCOUNTER — Encounter: Payer: Self-pay | Admitting: Physical Therapy

## 2018-11-25 DIAGNOSIS — M25562 Pain in left knee: Secondary | ICD-10-CM

## 2018-11-25 DIAGNOSIS — M6281 Muscle weakness (generalized): Secondary | ICD-10-CM

## 2018-11-25 DIAGNOSIS — M25662 Stiffness of left knee, not elsewhere classified: Secondary | ICD-10-CM

## 2018-11-25 DIAGNOSIS — R262 Difficulty in walking, not elsewhere classified: Secondary | ICD-10-CM

## 2018-11-25 DIAGNOSIS — R600 Localized edema: Secondary | ICD-10-CM

## 2018-11-25 DIAGNOSIS — R2689 Other abnormalities of gait and mobility: Secondary | ICD-10-CM | POA: Diagnosis not present

## 2018-11-25 NOTE — Therapy (Signed)
[place], [place], [address] Phone: [phone]   Fax:  [phone]  Physical Therapy Treatment  Patient Details  Name: Kristin Benjamin MRN: [id number] Date of Birth: 1956/10/19 Referring Provider (PT): Jean Rosenthal, MD   Encounter Date: 11/25/2018  PT End of Session - 11/25/18 1021    Visit Number  9    Number of Visits  12    Date for PT Re-Evaluation  12/06/18    Authorization Type  BCBS    PT Start Time  0930    PT Stop Time  1025    PT Time Calculation (min)  55 min    Activity Tolerance  Patient tolerated treatment well    Behavior During Therapy  [facility] for tasks assessed/performed       Past Medical History:  Diagnosis Date  . Anxiety   . Arthritis   . Depression   . Headache    history of  . Heart murmur   . Hx of adenomatous colonic polyps 07/17/2017  . Hyperlipidemia   . Hypertension     Past Surgical History:  Procedure Laterality Date  . bladder tack    . COLONOSCOPY  2006   and 2018  . FUNCTIONAL ENDOSCOPIC SINUS SURGERY    . JOINT REPLACEMENT     Left total knee arthroplasty Dr. Ninfa Linden 10-18-18  . KNEE ARTHROSCOPY W/ MENISCAL REPAIR Right 08/2016  . TOTAL KNEE ARTHROPLASTY Left 10/18/2018   Procedure: LEFT TOTAL KNEE ARTHROPLASTY;  Surgeon: Mcarthur Rossetti, MD;  Location: [facility];  Service: Orthopedics;  Laterality: Left;  . TUBAL LIGATION  1993    There were no vitals filed for this visit.  Subjective Assessment - 11/25/18 0932    Subjective  focusing on bending; using bike at home.  has to take advil q 4 hours or pain is elevated.    Patient Stated Goals  Get ROM back & be able to get back to working in the yard & exercise classes    Currently in Pain?  No/denies         [facility] PT Assessment - 11/25/18 1022      Assessment   Medical Diagnosis  L TKR    Referring Provider (PT)  Jean Rosenthal, MD    Onset Date/Surgical Date  10/18/18     Next MD Visit  11/28/18      AROM   Left Knee Flexion  97      PROM   Left Knee Flexion  104                   [facility] Adult PT Treatment/Exercise - 11/25/18 0933      Knee/Hip Exercises: Aerobic   Recumbent Bike  full revolutions x 8 min; PT present to discuss progress    Other Aerobic  single laps around gym in between exercises to decrease stiffness       Knee/Hip Exercises: Supine   Quad Sets  Left;20 reps    Heel Slides  AAROM;Left;20 reps      Vasopneumatic   Number Minutes Vasopneumatic   15 minutes    Vasopnuematic Location   Knee    Vasopneumatic Pressure  Medium    Vasopneumatic Temperature   34 deg      Manual Therapy   Manual Therapy  Soft tissue mobilization;Passive ROM    Joint Mobilization  L patellar mobilizations superior/inferior; gentle grades 2-3 A/P femur glides    Passive ROM  Overpressure into Lt knee ext, 30 sec holds. Lt knee flexion seated with contract relax                  PT Long Term Goals - 11/22/18 1002      PT LONG TERM GOAL #1   Title  Independent with ongoing/advanced HEP    Status  On-going      PT LONG TERM GOAL #2   Title  L knee AROM >/= 3-120 degrees for improved gait pattern and stair negotiation    Status  On-going      PT LONG TERM GOAL #3   Title  L hip and knee strength >/= 4+/5 for improved stability    Status  On-going      PT LONG TERM GOAL #4   Title  Patient will ambulate with normal gait pattern w/o AD for increased ease of community ambulation    Status  On-going      PT LONG TERM GOAL #5   Title  Patient with negotiate stairs reciprocally with normal step pattern to increase ease of access to master bed and bathroom    Status  Achieved            Plan - 11/25/18 1022    Clinical Impression Statement  Pt demonstrating slow and steady improvements in passive and active flexion progressing well overall with PT.  Will continue to benefit from PT to maximize function.    Rehab  Potential  Excellent    PT Frequency  3x / week    PT Duration  4 weeks    PT Treatment/Interventions  Patient/family education;Therapeutic exercise;Therapeutic activities;Functional mobility training;Gait training;Stair training;Balance training;Neuromuscular re-education;Manual techniques;Scar mobilization;Passive range of motion;Dry needling;Taping;Joint Manipulations;Electrical Stimulation;Cryotherapy;Vasopneumatic Device;Moist Heat;ADLs/Self Care Home Management    PT Next Visit Plan  continue progressive ROM/strengthening for Lt knee.   Measure strength of LLE.  look at extension ROM    Consulted and Agree with Plan of Care  Patient       Patient will benefit from skilled therapeutic intervention in order to improve the following deficits and impairments:  Pain, Decreased range of motion, Impaired flexibility, Decreased strength, Difficulty walking, Abnormal gait, Decreased activity tolerance, Decreased balance, Decreased scar mobility  Visit Diagnosis: Acute pain of left knee  Stiffness of left knee, not elsewhere classified  Muscle weakness (generalized)  Other abnormalities of gait and mobility  Difficulty in walking, not elsewhere classified  Localized edema     Problem List Patient Active Problem List   Diagnosis Date Noted  . Unilateral primary osteoarthritis, left knee 10/18/2018  . Status post total left knee replacement 10/18/2018  . Unilateral primary osteoarthritis, right knee 03/28/2018  . Chronic pain of right knee 03/28/2018  . Hx of adenomatous colonic polyps 07/17/2017      Laureen Abrahams, PT, DPT 11/25/18 10:24 AM    [facility] [place], [place], [address] Phone: [phone]   Fax:  [phone]  Name: Kristin Benjamin MRN: [id number] Date of Birth: [birth year]

## 2018-11-27 ENCOUNTER — Ambulatory Visit (INDEPENDENT_AMBULATORY_CARE_PROVIDER_SITE_OTHER): Payer: BLUE CROSS/BLUE SHIELD | Admitting: Rehabilitative and Restorative Service Providers"

## 2018-11-27 ENCOUNTER — Encounter: Payer: BLUE CROSS/BLUE SHIELD | Admitting: Physical Therapy

## 2018-11-27 ENCOUNTER — Encounter: Payer: Self-pay | Admitting: Rehabilitative and Restorative Service Providers"

## 2018-11-27 DIAGNOSIS — M6281 Muscle weakness (generalized): Secondary | ICD-10-CM

## 2018-11-27 DIAGNOSIS — M25662 Stiffness of left knee, not elsewhere classified: Secondary | ICD-10-CM | POA: Diagnosis not present

## 2018-11-27 DIAGNOSIS — R2689 Other abnormalities of gait and mobility: Secondary | ICD-10-CM | POA: Diagnosis not present

## 2018-11-27 DIAGNOSIS — R600 Localized edema: Secondary | ICD-10-CM

## 2018-11-27 DIAGNOSIS — M25562 Pain in left knee: Secondary | ICD-10-CM

## 2018-11-27 DIAGNOSIS — R262 Difficulty in walking, not elsewhere classified: Secondary | ICD-10-CM

## 2018-11-27 NOTE — Therapy (Signed)
[place], [place], [address] Phone: [phone]   Fax:  [phone]  Physical Therapy Treatment  Patient Details  Name: Kristin Benjamin MRN: [id number] Date of Birth: [birth year] Referring Provider (PT): Jean Rosenthal, MD   Encounter Date: 11/27/2018  PT End of Session - 11/27/18 0934    Visit Number  10    Number of Visits  12    Date for PT Re-Evaluation  12/06/18    Authorization Type  BCBS    PT Start Time  0929    PT Stop Time  1025    PT Time Calculation (min)  56 min    Activity Tolerance  Patient tolerated treatment well       Past Medical History:  Diagnosis Date  . Anxiety   . Arthritis   . Depression   . Headache    history of  . Heart murmur   . Hx of adenomatous colonic polyps 07/17/2017  . Hyperlipidemia   . Hypertension     Past Surgical History:  Procedure Laterality Date  . bladder tack    . COLONOSCOPY  2006   and 2018  . FUNCTIONAL ENDOSCOPIC SINUS SURGERY    . JOINT REPLACEMENT     Left total knee arthroplasty Dr. Ninfa Linden 10-18-18  . KNEE ARTHROSCOPY W/ MENISCAL REPAIR Right 08/2016  . TOTAL KNEE ARTHROPLASTY Left 10/18/2018   Procedure: LEFT TOTAL KNEE ARTHROPLASTY;  Surgeon: Mcarthur Rossetti, MD;  Location: [facility];  Service: Orthopedics;  Laterality: Left;  . TUBAL LIGATION  1993    There were no vitals filed for this visit.  Subjective Assessment - 11/27/18 0931    Subjective  Working on exercises at home - "giving it all I got". She has to return to work next week. She hopes to work from home at least on days she has PT. Returns to MD tomorrow.     Currently in Pain?  No/denies         [facility] PT Assessment - 11/27/18 0001      Assessment   Medical Diagnosis  L TKR    Referring Provider (PT)  Jean Rosenthal, MD    Onset Date/Surgical Date  10/18/18    Next MD Visit  11/28/18    Prior Therapy  HH PT x 2 weeks      AROM   Right  Knee Extension  0    Right Knee Flexion  136    Left Knee Extension  -4    Left Knee Flexion  97      PROM   Left Knee Extension  -2    Left Knee Flexion  105      Strength   Right Hip Flexion  --   5-/5   Right Hip Extension  4+/5    Right Hip ABduction  4+/5    Right Hip ADduction  4+/5    Left Knee Flexion  --   5-/5   Left Knee Extension  4+/5      Flexibility   Hamstrings  WFL                   [facility] Adult PT Treatment/Exercise - 11/27/18 0001      Knee/Hip Exercises: Stretches   Passive Hamstring Stretch  Left;2 reps;30 seconds   supine with strap overpressure into knee ext w/hand    Passive Hamstring Stretch Limitations  sitting with Lt LE extended heel propped on surface for stretch -  with quad sets 10 sec hold x 10; overpressure by pt 10 sec x 5     Quad Stretch  Left;4 reps;30 seconds   prone with strap - added noodle distal thigh for last 2 reps   Gastroc Stretch  Both;2 reps;30 seconds    Other Knee/Hip Stretches  forward lunge for ROM with foot on 8 inch step holding 20 sec x 5 reps; repeated with foot on back of truck (23 inches) x 3 reps      Knee/Hip Exercises: Aerobic   Recumbent Bike  L2 x 7 min     Other Aerobic  single laps around gym in between exercises to decrease stiffness       Knee/Hip Exercises: Standing   Lateral Step Up  Left;1 set;10 reps;Hand Hold: 1;Step Height: 6"    Lateral Step Up Limitations  tapping Rt heel      Forward Step Up  Left;1 set;10 reps;Hand Hold: 1;Step Height: 6"      Knee/Hip Exercises: Supine   Quad Sets  AROM;Strengthening;Left;10 reps   10 sec hold    Heel Slides Limitations  hip knee flexion rolling ball under foot using strap 30 sec hold x 5 reps     Other Supine Knee/Hip Exercises  holding hip and hip in 90 deg flexion allowing gravity to flex knee       Vasopneumatic   Number Minutes Vasopneumatic   15 minutes    Vasopnuematic Location   Knee    Vasopneumatic Pressure  Medium    Vasopneumatic  Temperature   34 deg      Manual Therapy   Joint Mobilization  L patellar mobilizations superior/inferior; gentle grades 2-3 A/P femur glides    Soft tissue mobilization  deep tissue work through quad to improve tissue extensibility     Passive ROM  PROM Lt knee flexion in supine with bridge and scoot toward heel x 4                   PT Long Term Goals - 11/22/18 1002      PT LONG TERM GOAL #1   Title  Independent with ongoing/advanced HEP    Status  On-going      PT LONG TERM GOAL #2   Title  L knee AROM >/= 3-120 degrees for improved gait pattern and stair negotiation    Status  On-going      PT LONG TERM GOAL #3   Title  L hip and knee strength >/= 4+/5 for improved stability    Status  On-going      PT LONG TERM GOAL #4   Title  Patient will ambulate with normal gait pattern w/o AD for increased ease of community ambulation    Status  On-going      PT LONG TERM GOAL #5   Title  Patient with negotiate stairs reciprocally with normal step pattern to increase ease of access to master bed and bathroom    Status  Achieved            Plan - 11/27/18 0934    Clinical Impression Statement  Continued steady improvement with passive and active ROM as well as strength. Patient is ambulating well without assistive device. She is working on exercises well at home. Progressing well toward stated rehab goals.     Rehab Potential  Excellent    PT Frequency  3x / week    PT Duration  4 weeks    PT Treatment/Interventions  Patient/family education;Therapeutic exercise;Therapeutic activities;Functional mobility training;Gait training;Stair training;Balance training;Neuromuscular re-education;Manual techniques;Scar mobilization;Passive range of motion;Dry needling;Taping;Joint Manipulations;Electrical Stimulation;Cryotherapy;Vasopneumatic Device;Moist Heat;ADLs/Self Care Home Management    PT Next Visit Plan  continue progressive ROM/strengthening for Lt knee.   Note to MD for  appt tomorrow.     Consulted and Agree with Plan of Care  Patient       Patient will benefit from skilled therapeutic intervention in order to improve the following deficits and impairments:  Pain, Decreased range of motion, Impaired flexibility, Decreased strength, Difficulty walking, Abnormal gait, Decreased activity tolerance, Decreased balance, Decreased scar mobility  Visit Diagnosis: Acute pain of left knee  Stiffness of left knee, not elsewhere classified  Muscle weakness (generalized)  Other abnormalities of gait and mobility  Difficulty in walking, not elsewhere classified  Localized edema     Problem List Patient Active Problem List   Diagnosis Date Noted  . Unilateral primary osteoarthritis, left knee 10/18/2018  . Status post total left knee replacement 10/18/2018  . Unilateral primary osteoarthritis, right knee 03/28/2018  . Chronic pain of right knee 03/28/2018  . Hx of adenomatous colonic polyps 07/17/2017    Celyn Nilda Simmer PT, MPH  11/27/2018, 10:30 AM  [facility] [place], [place], [address] Phone: [phone]   Fax:  [phone]  Name: KYNSLEI ART MRN: [id number] Date of Birth: 06-22-1956

## 2018-11-28 ENCOUNTER — Ambulatory Visit (INDEPENDENT_AMBULATORY_CARE_PROVIDER_SITE_OTHER): Payer: BLUE CROSS/BLUE SHIELD | Admitting: Orthopaedic Surgery

## 2018-11-28 ENCOUNTER — Encounter (INDEPENDENT_AMBULATORY_CARE_PROVIDER_SITE_OTHER): Payer: Self-pay | Admitting: Orthopaedic Surgery

## 2018-11-28 DIAGNOSIS — Z96652 Presence of left artificial knee joint: Secondary | ICD-10-CM

## 2018-11-28 MED ORDER — METHOCARBAMOL 500 MG PO TABS: 500.0000 mg | ORAL_TABLET | Freq: Four times a day (QID) | ORAL | 0 refills | Status: DC | PRN

## 2018-11-28 NOTE — Progress Notes (Signed)
The patient is 6 weeks status post a left total knee arthroplasty.  She is making progress with therapy.  She is scheduled to go back to work next week.  On exam she lacks full extension by maybe 3 degrees.  Her flexion is to 105 degrees today.  The knee feels ligaments is stable.  There is some slight swelling to be expected but no significant effusion.  The knee feels slightly warm but the incision looks good.  She will continue outpatient physical therapy and aggressive motion of her knee.  I did refill her muscle relaxants.  She can swim from my standpoint.  She can return to work on 12/02/2018 but should be allowed to work from home on days that she does have therapy or leaves have accommodations to allow her to leave work for therapy until released 4 weeks from now.  We will see her back in 3 months.  At that visit I would like an AP and lateral of her left knee.

## 2018-11-29 ENCOUNTER — Encounter: Payer: BLUE CROSS/BLUE SHIELD | Admitting: Physical Therapy

## 2018-11-29 ENCOUNTER — Encounter: Payer: Self-pay | Admitting: Physical Therapy

## 2018-11-29 ENCOUNTER — Ambulatory Visit (INDEPENDENT_AMBULATORY_CARE_PROVIDER_SITE_OTHER): Payer: BLUE CROSS/BLUE SHIELD | Admitting: Physical Therapy

## 2018-11-29 DIAGNOSIS — M25562 Pain in left knee: Secondary | ICD-10-CM | POA: Diagnosis not present

## 2018-11-29 DIAGNOSIS — M6281 Muscle weakness (generalized): Secondary | ICD-10-CM

## 2018-11-29 DIAGNOSIS — M25662 Stiffness of left knee, not elsewhere classified: Secondary | ICD-10-CM | POA: Diagnosis not present

## 2018-11-29 NOTE — Therapy (Signed)
[place], [place], [address] Phone: [phone]   Fax:  [phone]  Physical Therapy Treatment  Patient Details  Name: Kristin Benjamin MRN: [id number] Date of Birth: 1956-10-09 Referring Provider (PT): Jean Rosenthal, MD   Encounter Date: 11/29/2018  PT End of Session - 11/29/18 1016    Visit Number  11    Number of Visits  12    Date for PT Re-Evaluation  12/06/18    Authorization Type  BCBS    Authorization - Number of Visits  31    PT Start Time  0931    PT Stop Time  1029    PT Time Calculation (min)  58 min    Activity Tolerance  Patient tolerated treatment well;No increased pain    Behavior During Therapy  WFL for tasks assessed/performed       Past Medical History:  Diagnosis Date  . Anxiety   . Arthritis   . Depression   . Headache    history of  . Heart murmur   . Hx of adenomatous colonic polyps 07/17/2017  . Hyperlipidemia   . Hypertension     Past Surgical History:  Procedure Laterality Date  . bladder tack    . COLONOSCOPY  2006   and 2018  . FUNCTIONAL ENDOSCOPIC SINUS SURGERY    . JOINT REPLACEMENT     Left total knee arthroplasty Dr. Ninfa Linden 10-18-18  . KNEE ARTHROSCOPY W/ MENISCAL REPAIR Right 08/2016  . TOTAL KNEE ARTHROPLASTY Left 10/18/2018   Procedure: LEFT TOTAL KNEE ARTHROPLASTY;  Surgeon: Mcarthur Rossetti, MD;  Location: [facility];  Service: Orthopedics;  Laterality: Left;  . TUBAL LIGATION  1993    There were no vitals filed for this visit.  Subjective Assessment - 11/29/18 1053    Subjective  "I was really sore after last session".  Pt states she has been working hard on ROM at home.  She has borrowed a bike from sister and is riding it about 3x/day.     Patient Stated Goals  Get ROM back & be able to get back to working in the yard & exercise classes    Currently in Pain?  No/denies    Pain Score  0-No pain         [facility] PT Assessment  - 11/29/18 0001      Assessment   Medical Diagnosis  L TKR    Referring Provider (PT)  Jean Rosenthal, MD    Onset Date/Surgical Date  10/18/18    Next MD Visit  3 months    Prior Therapy  HH PT x 2 weeks      Flexibility   Quadriceps  Lt 105 deg        [facility] Adult PT Treatment/Exercise - 11/29/18 0001      Knee/Hip Exercises: Stretches   Passive Hamstring Stretch  Left;2 reps;60 seconds   supine with strap overpressure into knee ext w/hand    Quad Stretch  Left;30 seconds;3 reps   prone with strap - noodle under distal thigh    Gastroc Stretch  Both;2 reps;60 seconds   incline board   Other Knee/Hip Stretches  forward lunge for ROM with foot on back of truck (23 inches) holding 20 sec x 5 reps    Other Knee/Hip Stretches  single knee to chest x 20 sec , x 2 reps each leg.       Knee/Hip Exercises: Aerobic   Recumbent Bike  L2 x 7 min    for ROM; PTA present to discuss progress and monitor   Other Aerobic  single laps around gym in between exercises to decrease stiffness       Knee/Hip Exercises: Standing   Lateral Step Up  Left;1 set;10 reps;Hand Hold: 1;Step Height: 8"    Step Down  Right;1 set;10 reps;Hand Hold: 2;Step Height: 6"   and retro step up with LLE   SLS  Lt/Rt SLS on blue pad with out UE support x 30 sec x 2 reps each side, last 10 sec with horz head turns.     Other Standing Knee Exercises  butt kicks x 15 reps (standing in place)     Other Standing Knee Exercises  tandem stance on 1/2 foam roller (flat side down) x 30 sec, each leg forward.       Knee/Hip Exercises: Supine   Bridges  1 set;10 reps   5 sec hold.      Modalities   Modalities  Moist Heat;Vasopneumatic      Moist Heat Therapy   Number Minutes Moist Heat  15 Minutes    Moist Heat Location  Hip   bilat buttocks      Vasopneumatic   Number Minutes Vasopneumatic   15 minutes    Vasopnuematic Location   Knee   Lt   Vasopneumatic Pressure  Medium    Vasopneumatic Temperature   34 deg       Manual Therapy   Manual Therapy  Taping    Kinesiotex  Create Space      Kinesiotix   Create Space  I strip of sensitive skin Rock Tape applied in zigzag pattern to assist with scar management.         PT Long Term Goals - 11/22/18 1002      PT LONG TERM GOAL #1   Title  Independent with ongoing/advanced HEP    Status  On-going      PT LONG TERM GOAL #2   Title  L knee AROM >/= 3-120 degrees for improved gait pattern and stair negotiation    Status  On-going      PT LONG TERM GOAL #3   Title  L hip and knee strength >/= 4+/5 for improved stability    Status  On-going      PT LONG TERM GOAL #4   Title  Patient will ambulate with normal gait pattern w/o AD for increased ease of community ambulation    Status  On-going      PT LONG TERM GOAL #5   Title  Patient with negotiate stairs reciprocally with normal step pattern to increase ease of access to master bed and bathroom    Status  Achieved            Plan - 11/29/18 1047    Clinical Impression Statement  Pt tolerated all exercises well, with mild increase in discomfort with ROM / stretching exercises.  ROM continues to gradually improves each visit.  Quad flexibility also improving.  Discomfort relieved with use of vaso and MHP at end of session. Progressing well towards goals.     Rehab Potential  Excellent    PT Frequency  3x / week    PT Duration  4 weeks    PT Treatment/Interventions  Patient/family education;Therapeutic exercise;Therapeutic activities;Functional mobility training;Gait training;Stair training;Balance training;Neuromuscular re-education;Manual techniques;Scar mobilization;Passive range of motion;Dry needling;Taping;Joint Manipulations;Electrical Stimulation;Cryotherapy;Vasopneumatic Device;Moist Heat;ADLs/Self Care Home Management    PT Next Visit Plan  end  of POC; assess goals and need for additional therapy sessions.     Consulted and Agree with Plan of Care  Patient       Patient will  benefit from skilled therapeutic intervention in order to improve the following deficits and impairments:  Pain, Decreased range of motion, Impaired flexibility, Decreased strength, Difficulty walking, Abnormal gait, Decreased activity tolerance, Decreased balance, Decreased scar mobility  Visit Diagnosis: Acute pain of left knee  Stiffness of left knee, not elsewhere classified  Muscle weakness (generalized)     Problem List Patient Active Problem List   Diagnosis Date Noted  . Unilateral primary osteoarthritis, left knee 10/18/2018  . Status post total left knee replacement 10/18/2018  . Unilateral primary osteoarthritis, right knee 03/28/2018  . Chronic pain of right knee 03/28/2018  . Hx of adenomatous colonic polyps 07/17/2017   Kerin Perna, PTA 11/29/18 10:55 AM  [facility] [place], [place], [address] Phone: [phone]   Fax:  [phone]  Name: YAIRE KREHER MRN: [id number] Date of Birth: 08/10/1956

## 2018-11-30 ENCOUNTER — Other Ambulatory Visit (HOSPITAL_COMMUNITY): Payer: Self-pay | Admitting: Orthopaedic Surgery

## 2018-12-02 ENCOUNTER — Encounter: Payer: BLUE CROSS/BLUE SHIELD | Admitting: Physical Therapy

## 2018-12-02 ENCOUNTER — Encounter: Payer: Self-pay | Admitting: Physical Therapy

## 2018-12-02 ENCOUNTER — Ambulatory Visit (INDEPENDENT_AMBULATORY_CARE_PROVIDER_SITE_OTHER): Payer: BLUE CROSS/BLUE SHIELD | Admitting: Physical Therapy

## 2018-12-02 DIAGNOSIS — M25662 Stiffness of left knee, not elsewhere classified: Secondary | ICD-10-CM

## 2018-12-02 DIAGNOSIS — M6281 Muscle weakness (generalized): Secondary | ICD-10-CM

## 2018-12-02 DIAGNOSIS — R2689 Other abnormalities of gait and mobility: Secondary | ICD-10-CM | POA: Diagnosis not present

## 2018-12-02 DIAGNOSIS — M25562 Pain in left knee: Secondary | ICD-10-CM | POA: Diagnosis not present

## 2018-12-02 DIAGNOSIS — R600 Localized edema: Secondary | ICD-10-CM

## 2018-12-02 DIAGNOSIS — R262 Difficulty in walking, not elsewhere classified: Secondary | ICD-10-CM

## 2018-12-02 NOTE — Therapy (Signed)
[place], [place], [address] Phone: [phone]   Fax:  [phone]  Physical Therapy Treatment/Recertification  Patient Details  Name: Kristin Benjamin MRN: [id number] Date of Birth: [birth year] Referring Provider (PT): Jean Rosenthal, MD   Encounter Date: 12/02/2018  PT End of Session - 12/02/18 1024    Visit Number  12    Number of Visits  20    Date for PT Re-Evaluation  12/30/18    Authorization Type  BCBS    Authorization - Number of Visits  31    PT Start Time  [phone]    PT Stop Time  1022    PT Time Calculation (min)  57 min    Activity Tolerance  Patient tolerated treatment well;No increased pain    Behavior During Therapy  WFL for tasks assessed/performed       Past Medical History:  Diagnosis Date  . Anxiety   . Arthritis   . Depression   . Headache    history of  . Heart murmur   . Hx of adenomatous colonic polyps 07/17/2017  . Hyperlipidemia   . Hypertension     Past Surgical History:  Procedure Laterality Date  . bladder tack    . COLONOSCOPY  2006   and 2018  . FUNCTIONAL ENDOSCOPIC SINUS SURGERY    . JOINT REPLACEMENT     Left total knee arthroplasty Dr. Ninfa Linden 10-18-18  . KNEE ARTHROSCOPY W/ MENISCAL REPAIR Right 08/2016  . TOTAL KNEE ARTHROPLASTY Left 10/18/2018   Procedure: LEFT TOTAL KNEE ARTHROPLASTY;  Surgeon: Mcarthur Rossetti, MD;  Location: [facility];  Service: Orthopedics;  Laterality: Left;  . TUBAL LIGATION  1993    There were no vitals filed for this visit.  Subjective Assessment - 12/02/18 0927    Subjective  doing well.  MD pleased with progress    Pertinent History  L TKR 10/18/18; severe R knee OA    Patient Stated Goals  Get ROM back & be able to get back to working in the yard & exercise classes    Currently in Pain?  No/denies         [facility] PT Assessment - 12/02/18 0929      Assessment   Medical Diagnosis  L TKR    Referring  Provider (PT)  Jean Rosenthal, MD    Onset Date/Surgical Date  10/18/18      AROM   Left Knee Extension  0      PROM   Left Knee Extension  0    Left Knee Flexion  112                   [facility] Adult PT Treatment/Exercise - 12/02/18 0929      Knee/Hip Exercises: Stretches   Passive Hamstring Stretch  Left;2 reps;60 seconds   supine with strap overpressure into knee ext w/hand    Quad Stretch  Left;30 seconds;3 reps   prone with strap - noodle under distal thigh      Knee/Hip Exercises: Aerobic   Recumbent Bike  x 8 min for warm up and ROM      Knee/Hip Exercises: Machines for Strengthening   Cybex Knee Extension  LLE only 1 plate 2x10    Cybex Leg Press  LLE 4 plates 2x10      Knee/Hip Exercises: Supine   Quad Sets  Strengthening;Left;AROM;15 reps    Quad Sets Limitations  10 sec hold; heel prop on  noodle      Knee/Hip Exercises: Prone   Hamstring Curl  5 reps   Left     Vasopneumatic   Number Minutes Vasopneumatic   15 minutes    Vasopnuematic Location   Knee    Vasopneumatic Pressure  Medium    Vasopneumatic Temperature   34 deg      Manual Therapy   Passive ROM  Lt knee into extension supine and flexion prone                  PT Long Term Goals - 12/02/18 1024      PT LONG TERM GOAL #1   Title  Independent with ongoing/advanced HEP    Status  On-going    Target Date  12/30/18      PT LONG TERM GOAL #2   Title  L knee AROM >/= 3-120 degrees for improved gait pattern and stair negotiation    Baseline  partially met    Status  Partially Met      PT LONG TERM GOAL #3   Title  L hip and knee strength >/= 4+/5 for improved stability    Status  On-going    Target Date  12/30/18      PT LONG TERM GOAL #4   Title  Patient will ambulate with normal gait pattern w/o AD for increased ease of community ambulation    Status  Achieved      PT LONG TERM GOAL #5   Title  Patient with negotiate stairs reciprocally with normal step pattern  to increase ease of access to master bed and bathroom    Status  Achieved       Updated LTGs: PT Long Term Goals - 12/02/18 1029      PT LONG TERM GOAL #1   Title  Independent with ongoing/advanced HEP    Status  On-going      PT LONG TERM GOAL #2   Title  L knee AROM 0-120 degrees for improved gait pattern and stair negotiation    Status  Revised      PT LONG TERM GOAL #3   Title  L hip and knee strength >/= 4+/5 for improved stability    Status  On-going      PT LONG TERM GOAL #4   Title  n/a      PT LONG TERM GOAL #5   Title  n/a           Plan - 12/02/18 1024    Clinical Impression Statement  Pt has met 2/5 LTGs and partially met ROM goal at this time.  Recommend continued PT 2x/wk x 4 wks to address ROM and strength to maximize functional mobility.  Will continue to benefit from PT to maximize function.    Rehab Potential  Excellent    PT Frequency  3x / week    PT Duration  4 weeks    PT Treatment/Interventions  Patient/family education;Therapeutic exercise;Therapeutic activities;Functional mobility training;Gait training;Stair training;Balance training;Neuromuscular re-education;Manual techniques;Scar mobilization;Passive range of motion;Dry needling;Taping;Joint Manipulations;Electrical Stimulation;Cryotherapy;Vasopneumatic Device;Moist Heat;ADLs/Self Care Home Management    PT Next Visit Plan  continue to focus on ROM (maintain ext, gain with flexion), functional strengthening    Consulted and Agree with Plan of Care  Patient       Patient will benefit from skilled therapeutic intervention in order to improve the following deficits and impairments:  Pain, Decreased range of motion, Impaired flexibility, Decreased strength, Difficulty walking, Abnormal gait, Decreased activity  tolerance, Decreased balance, Decreased scar mobility  Visit Diagnosis: Acute pain of left knee  Stiffness of left knee, not elsewhere classified  Muscle weakness (generalized)  Other  abnormalities of gait and mobility  Difficulty in walking, not elsewhere classified  Localized edema     Problem List Patient Active Problem List   Diagnosis Date Noted  . Unilateral primary osteoarthritis, left knee 10/18/2018  . Status post total left knee replacement 10/18/2018  . Unilateral primary osteoarthritis, right knee 03/28/2018  . Chronic pain of right knee 03/28/2018  . Hx of adenomatous colonic polyps 07/17/2017      Laureen Abrahams, PT, DPT 12/02/18 10:31 AM    [facility] [place], [place], [address] Phone: [phone]   Fax:  [phone]  Name: Kristin Benjamin MRN: [id number] Date of Birth: 1956-07-06

## 2018-12-02 NOTE — Telephone Encounter (Signed)
Continue

## 2018-12-04 ENCOUNTER — Encounter: Payer: BLUE CROSS/BLUE SHIELD | Admitting: Physical Therapy

## 2018-12-05 ENCOUNTER — Encounter: Payer: Self-pay | Admitting: Physical Therapy

## 2018-12-05 ENCOUNTER — Ambulatory Visit (INDEPENDENT_AMBULATORY_CARE_PROVIDER_SITE_OTHER): Payer: BLUE CROSS/BLUE SHIELD | Admitting: Physical Therapy

## 2018-12-05 VITALS — BP 94/62

## 2018-12-05 DIAGNOSIS — M6281 Muscle weakness (generalized): Secondary | ICD-10-CM

## 2018-12-05 DIAGNOSIS — M25662 Stiffness of left knee, not elsewhere classified: Secondary | ICD-10-CM | POA: Diagnosis not present

## 2018-12-05 DIAGNOSIS — R600 Localized edema: Secondary | ICD-10-CM

## 2018-12-05 DIAGNOSIS — R2689 Other abnormalities of gait and mobility: Secondary | ICD-10-CM | POA: Diagnosis not present

## 2018-12-05 DIAGNOSIS — R262 Difficulty in walking, not elsewhere classified: Secondary | ICD-10-CM

## 2018-12-05 DIAGNOSIS — M25562 Pain in left knee: Secondary | ICD-10-CM

## 2018-12-05 NOTE — Therapy (Signed)
[place], [place], [address] Phone: [phone]   Fax:  [phone]  Physical Therapy Treatment  Patient Details  Name: Kristin Benjamin MRN: [id number] Date of Birth: 1956-05-28 Referring Provider (PT): Jean Rosenthal, MD   Encounter Date: 12/05/2018  PT End of Session - 12/05/18 1521    Visit Number  13    Number of Visits  20    Date for PT Re-Evaluation  12/30/18    Authorization Type  BCBS    Authorization - Number of Visits  31    PT Start Time  1437    PT Stop Time  1535    PT Time Calculation (min)  58 min    Activity Tolerance  Patient tolerated treatment well;No increased pain    Behavior During Therapy  WFL for tasks assessed/performed       Past Medical History:  Diagnosis Date  . Anxiety   . Arthritis   . Depression   . Headache    history of  . Heart murmur   . Hx of adenomatous colonic polyps 07/17/2017  . Hyperlipidemia   . Hypertension     Past Surgical History:  Procedure Laterality Date  . bladder tack    . COLONOSCOPY  2006   and 2018  . FUNCTIONAL ENDOSCOPIC SINUS SURGERY    . JOINT REPLACEMENT     Left total knee arthroplasty Dr. Ninfa Linden 10-18-18  . KNEE ARTHROSCOPY W/ MENISCAL REPAIR Right 08/2016  . TOTAL KNEE ARTHROPLASTY Left 10/18/2018   Procedure: LEFT TOTAL KNEE ARTHROPLASTY;  Surgeon: Mcarthur Rossetti, MD;  Location: [facility];  Service: Orthopedics;  Laterality: Left;  . TUBAL LIGATION  1993    Vitals:   12/05/18 1511  BP: 94/62    Subjective Assessment - 12/05/18 1439    Subjective  doing well.  knee is doing well.  back to work this week and it's going well    Pertinent History  L TKR 10/18/18; severe R knee OA    Patient Stated Goals  Get ROM back & be able to get back to working in the yard & exercise classes    Currently in Pain?  No/denies         [facility] PT Assessment - 12/05/18 0001      PROM   Left Knee Extension  0    Left Knee Flexion  111                   [facility] Adult PT Treatment/Exercise - 12/05/18 1440      Knee/Hip Exercises: Stretches   Passive Hamstring Stretch  Left;30 seconds;3 reps    supine with strap overpressure into knee ext w/hand    Quad Stretch  Left;30 seconds;3 reps   prone with strap - noodle under distal thigh      Knee/Hip Exercises: Aerobic   Recumbent Bike  x 8 min for warm up and ROM      Knee/Hip Exercises: Supine   Quad Sets  Strengthening;Left;AROM;20 reps    Quad Sets Limitations  5 sec hold    Straight Leg Raises  Left;15 reps    Straight Leg Raises Limitations  3#      Knee/Hip Exercises: Sidelying   Hip ABduction  Left;15 reps    Hip ABduction Limitations  3#; with extension      Vasopneumatic   Number Minutes Vasopneumatic   15 minutes    Vasopnuematic Location   Knee  Vasopneumatic Pressure  Medium    Vasopneumatic Temperature   34 deg      Manual Therapy   Passive ROM  Lt knee into extension supine and flexion seated with contract/relax                  PT Long Term Goals - 12/02/18 1029      PT LONG TERM GOAL #1   Title  Independent with ongoing/advanced HEP    Status  On-going      PT LONG TERM GOAL #2   Title  L knee AROM 0-120 degrees for improved gait pattern and stair negotiation    Status  Revised      PT LONG TERM GOAL #3   Title  L hip and knee strength >/= 4+/5 for improved stability    Status  On-going      PT LONG TERM GOAL #4   Title  n/a      PT LONG TERM GOAL #5   Title  n/a            Plan - 12/05/18 1521    Clinical Impression Statement  Pt reported taking pain pill at ~ 1:00 today and became lightheaded during session with manual therapy.  BP check which was low normal.  Continued exercises supine once pt lied down and symptoms resolved.  Progressing well towards goals.      Rehab Potential  Excellent    PT Frequency  3x / week    PT Duration  4 weeks    PT Treatment/Interventions   Patient/family education;Therapeutic exercise;Therapeutic activities;Functional mobility training;Gait training;Stair training;Balance training;Neuromuscular re-education;Manual techniques;Scar mobilization;Passive range of motion;Dry needling;Taping;Joint Manipulations;Electrical Stimulation;Cryotherapy;Vasopneumatic Device;Moist Heat;ADLs/Self Care Home Management    PT Next Visit Plan  continue to focus on ROM (maintain ext, gain with flexion), functional strengthening    Consulted and Agree with Plan of Care  Patient       Patient will benefit from skilled therapeutic intervention in order to improve the following deficits and impairments:  Pain, Decreased range of motion, Impaired flexibility, Decreased strength, Difficulty walking, Abnormal gait, Decreased activity tolerance, Decreased balance, Decreased scar mobility  Visit Diagnosis: Acute pain of left knee  Stiffness of left knee, not elsewhere classified  Muscle weakness (generalized)  Other abnormalities of gait and mobility  Difficulty in walking, not elsewhere classified  Localized edema     Problem List Patient Active Problem List   Diagnosis Date Noted  . Unilateral primary osteoarthritis, left knee 10/18/2018  . Status post total left knee replacement 10/18/2018  . Unilateral primary osteoarthritis, right knee 03/28/2018  . Chronic pain of right knee 03/28/2018  . Hx of adenomatous colonic polyps 07/17/2017      Laureen Abrahams, PT, DPT 12/05/18 3:24 PM     [facility] [place], [place], [address] Phone: [phone]   Fax:  [phone]  Name: Kristin Benjamin MRN: [id number] Date of Birth: 1955/12/08

## 2018-12-06 ENCOUNTER — Encounter: Payer: BLUE CROSS/BLUE SHIELD | Admitting: Physical Therapy

## 2018-12-09 ENCOUNTER — Ambulatory Visit (INDEPENDENT_AMBULATORY_CARE_PROVIDER_SITE_OTHER): Payer: BLUE CROSS/BLUE SHIELD | Admitting: Physical Therapy

## 2018-12-09 ENCOUNTER — Encounter: Payer: Self-pay | Admitting: Physical Therapy

## 2018-12-09 DIAGNOSIS — M6281 Muscle weakness (generalized): Secondary | ICD-10-CM

## 2018-12-09 DIAGNOSIS — M25662 Stiffness of left knee, not elsewhere classified: Secondary | ICD-10-CM | POA: Diagnosis not present

## 2018-12-09 DIAGNOSIS — R2689 Other abnormalities of gait and mobility: Secondary | ICD-10-CM

## 2018-12-09 DIAGNOSIS — M25562 Pain in left knee: Secondary | ICD-10-CM

## 2018-12-09 DIAGNOSIS — R600 Localized edema: Secondary | ICD-10-CM

## 2018-12-09 DIAGNOSIS — R262 Difficulty in walking, not elsewhere classified: Secondary | ICD-10-CM

## 2018-12-09 NOTE — Therapy (Signed)
[place], [place], [address] Phone: [phone]   Fax:  [phone]  Physical Therapy Treatment  Patient Details  Name: Kristin Benjamin MRN: [id number] Date of Birth: 11/24/1955 Referring Provider (PT): Jean Rosenthal, MD   Encounter Date: 12/09/2018  PT End of Session - 12/09/18 1033    Visit Number  14    Number of Visits  20    Date for PT Re-Evaluation  12/30/18    Authorization Type  BCBS    Authorization - Number of Visits  31    PT Start Time  0930    PT Stop Time  1025    PT Time Calculation (min)  55 min    Activity Tolerance  Patient tolerated treatment well;No increased pain    Behavior During Therapy  WFL for tasks assessed/performed       Past Medical History:  Diagnosis Date  . Anxiety   . Arthritis   . Depression   . Headache    history of  . Heart murmur   . Hx of adenomatous colonic polyps 07/17/2017  . Hyperlipidemia   . Hypertension     Past Surgical History:  Procedure Laterality Date  . bladder tack    . COLONOSCOPY  2006   and 2018  . FUNCTIONAL ENDOSCOPIC SINUS SURGERY    . JOINT REPLACEMENT     Left total knee arthroplasty Dr. Ninfa Linden 10-18-18  . KNEE ARTHROSCOPY W/ MENISCAL REPAIR Right 08/2016  . TOTAL KNEE ARTHROPLASTY Left 10/18/2018   Procedure: LEFT TOTAL KNEE ARTHROPLASTY;  Surgeon: Mcarthur Rossetti, MD;  Location: [facility];  Service: Orthopedics;  Laterality: Left;  . TUBAL LIGATION  1993    There were no vitals filed for this visit.  Subjective Assessment - 12/09/18 0932    Subjective  didn't take a pain pill today; feeling much better today.      Patient Stated Goals  Get ROM back & be able to get back to working in the yard & exercise classes    Currently in Pain?  No/denies         [facility] PT Assessment - 12/09/18 0953      Assessment   Medical Diagnosis  L TKR    Referring Provider (PT)  Jean Rosenthal, MD    Onset  Date/Surgical Date  10/18/18    Next MD Visit  3 months      AROM   Left Knee Extension  0    Left Knee Flexion  115                   [facility] Adult PT Treatment/Exercise - 12/09/18 0933      Knee/Hip Exercises: Stretches   Passive Hamstring Stretch  Left;30 seconds;3 reps    supine with strap overpressure into knee ext w/hand    Quad Stretch  Left;30 seconds;3 reps   prone with strap - noodle under distal thigh      Knee/Hip Exercises: Aerobic   Recumbent Bike  L3 x 8 min      Knee/Hip Exercises: Standing   Heel Raises  Left;20 reps    Functional Squat  1 set;10 reps;3 seconds;Limitations    Functional Squat Limitations  cues for technique    Wall Squat  10 reps;5 seconds    Other Standing Knee Exercises  steps up/over leading with LLE 2x10 reps      Knee/Hip Exercises: Seated   Heel Slides Limitations  80' x  2      Vasopneumatic   Number Minutes Vasopneumatic   15 minutes    Vasopnuematic Location   Knee    Vasopneumatic Pressure  Medium    Vasopneumatic Temperature   34 deg      Manual Therapy   Passive ROM  Lt knee extension and flexion in supine                  PT Long Term Goals - 12/02/18 1029      PT LONG TERM GOAL #1   Title  Independent with ongoing/advanced HEP    Status  On-going      PT LONG TERM GOAL #2   Title  L knee AROM 0-120 degrees for improved gait pattern and stair negotiation    Status  Revised      PT LONG TERM GOAL #3   Title  L hip and knee strength >/= 4+/5 for improved stability    Status  On-going      PT LONG TERM GOAL #4   Title  n/a      PT LONG TERM GOAL #5   Title  n/a            Plan - 12/09/18 1033    Clinical Impression Statement  Pt with improved AROM 0-115 today and progressing well with PT overall.  Will continue to benefit from PT to maximize function.    Rehab Potential  Excellent    PT Frequency  3x / week    PT Duration  4 weeks    PT Treatment/Interventions  Patient/family  education;Therapeutic exercise;Therapeutic activities;Functional mobility training;Gait training;Stair training;Balance training;Neuromuscular re-education;Manual techniques;Scar mobilization;Passive range of motion;Dry needling;Taping;Joint Manipulations;Electrical Stimulation;Cryotherapy;Vasopneumatic Device;Moist Heat;ADLs/Self Care Home Management    PT Next Visit Plan  continue to focus on ROM (maintain ext, gain with flexion), functional strengthening    Consulted and Agree with Plan of Care  Patient       Patient will benefit from skilled therapeutic intervention in order to improve the following deficits and impairments:  Pain, Decreased range of motion, Impaired flexibility, Decreased strength, Difficulty walking, Abnormal gait, Decreased activity tolerance, Decreased balance, Decreased scar mobility  Visit Diagnosis: Acute pain of left knee  Stiffness of left knee, not elsewhere classified  Muscle weakness (generalized)  Other abnormalities of gait and mobility  Difficulty in walking, not elsewhere classified  Localized edema     Problem List Patient Active Problem List   Diagnosis Date Noted  . Unilateral primary osteoarthritis, left knee 10/18/2018  . Status post total left knee replacement 10/18/2018  . Unilateral primary osteoarthritis, right knee 03/28/2018  . Chronic pain of right knee 03/28/2018  . Hx of adenomatous colonic polyps 07/17/2017      Laureen Abrahams, PT, DPT 12/09/18 10:37 AM    [facility] [place], [place], [address] Phone: [phone]   Fax:  [phone]  Name: Kristin Benjamin MRN: [id number] Date of Birth: 1956/05/02

## 2018-12-11 ENCOUNTER — Encounter: Payer: BLUE CROSS/BLUE SHIELD | Admitting: Physical Therapy

## 2018-12-12 ENCOUNTER — Ambulatory Visit (INDEPENDENT_AMBULATORY_CARE_PROVIDER_SITE_OTHER): Payer: BLUE CROSS/BLUE SHIELD | Admitting: Physical Therapy

## 2018-12-12 ENCOUNTER — Encounter: Payer: Self-pay | Admitting: Physical Therapy

## 2018-12-12 DIAGNOSIS — M25662 Stiffness of left knee, not elsewhere classified: Secondary | ICD-10-CM

## 2018-12-12 DIAGNOSIS — M6281 Muscle weakness (generalized): Secondary | ICD-10-CM | POA: Diagnosis not present

## 2018-12-12 DIAGNOSIS — R2689 Other abnormalities of gait and mobility: Secondary | ICD-10-CM | POA: Diagnosis not present

## 2018-12-12 DIAGNOSIS — M25562 Pain in left knee: Secondary | ICD-10-CM

## 2018-12-12 NOTE — Therapy (Signed)
[place], [place], [address] Phone: [phone]   Fax:  [phone]  Physical Therapy Treatment  Patient Details  Name: Kristin Benjamin MRN: [id number] Date of Birth: 03-14-1956 Referring Provider (PT): Jean Rosenthal, MD   Encounter Date: 12/12/2018  PT End of Session - 12/12/18 1629    Visit Number  15    Number of Visits  20    Date for PT Re-Evaluation  12/30/18    Authorization Type  BCBS    Authorization - Number of Visits  31    PT Start Time  1533    PT Stop Time  1630    PT Time Calculation (min)  57 min    Activity Tolerance  Patient tolerated treatment well    Behavior During Therapy  [facility] for tasks assessed/performed       Past Medical History:  Diagnosis Date  . Anxiety   . Arthritis   . Depression   . Headache    history of  . Heart murmur   . Hx of adenomatous colonic polyps 07/17/2017  . Hyperlipidemia   . Hypertension     Past Surgical History:  Procedure Laterality Date  . bladder tack    . COLONOSCOPY  2006   and 2018  . FUNCTIONAL ENDOSCOPIC SINUS SURGERY    . JOINT REPLACEMENT     Left total knee arthroplasty Dr. Ninfa Linden 10-18-18  . KNEE ARTHROSCOPY W/ MENISCAL REPAIR Right 08/2016  . TOTAL KNEE ARTHROPLASTY Left 10/18/2018   Procedure: LEFT TOTAL KNEE ARTHROPLASTY;  Surgeon: Mcarthur Rossetti, MD;  Location: [facility];  Service: Orthopedics;  Laterality: Left;  . TUBAL LIGATION  1993    There were no vitals filed for this visit.  Subjective Assessment - 12/12/18 1550    Subjective  Pt reports she is icing less and taking less Aleve. She voices readiness to d/c after next visit.     Patient Stated Goals  Get ROM back & be able to get back to working in the yard & exercise classes    Currently in Pain?  No/denies    Pain Score  0-No pain         [facility] PT Assessment - 12/12/18 0001      Assessment   Medical Diagnosis  L TKR    Referring  Provider (PT)  Jean Rosenthal, MD    Onset Date/Surgical Date  10/18/18    Next MD Visit  3 months      AROM   Left Knee Extension  --    Left Knee Flexion  --      PROM   Left Knee Extension  0    Left Knee Flexion  120      Flexibility   Quadriceps  Lt 110 deg        [facility] Adult PT Treatment/Exercise - 12/12/18 0001      Knee/Hip Exercises: Stretches   Passive Hamstring Stretch  Left;2 reps;30 seconds    Quad Stretch  Left;30 seconds;3 reps   prone with strap - noodle under distal thigh    Gastroc Stretch  Both;3 reps;30 seconds   incline board     Knee/Hip Exercises: Aerobic   Elliptical  L1: 2 min     Recumbent Bike  L3: 6.5 min    PTA present to discuss progress   Other Aerobic  single laps around gym in between exercises to decrease stiffness       Knee/Hip  Exercises: Machines for Strengthening   Cybex Knee Extension  BLE up and LLE only down, 2 plates 10    Cybex Leg Press  LLE 5 plates 2x10      Knee/Hip Exercises: Standing   Forward Step Up  Left;1 set;10 reps;Hand Hold: 0   10" step   Other Standing Knee Exercises  forward lunge with Lt foot on 24" step, x 30 sec hold x 3 reps (for increased knee flexion)     Other Standing Knee Exercises  steps up/over leading with LLE 1 rep      Vasopneumatic   Number Minutes Vasopneumatic   15 minutes    Vasopnuematic Location   Knee    Vasopneumatic Pressure  Medium    Vasopneumatic Temperature   34 deg      Manual Therapy   Passive ROM  Overpressure into Lt knee extension in supine and flexion in sitting       PT Long Term Goals - 12/02/18 1029      PT LONG TERM GOAL #1   Title  Independent with ongoing/advanced HEP    Status  On-going      PT LONG TERM GOAL #2   Title  L knee AROM 0-120 degrees for improved gait pattern and stair negotiation    Status  Revised      PT LONG TERM GOAL #3   Title  L hip and knee strength >/= 4+/5 for improved stability    Status  On-going      PT LONG TERM GOAL #4    Title  n/a      PT LONG TERM GOAL #5   Title  n/a            Plan - 12/12/18 1626    Clinical Impression Statement  Pt initially measured 110 deg of Lt knee flexion at beginning of session. By end of session, knee flexion on LLE was 120 deg.  Pt tolerated all exercises well, with mild increase in discomfort with ROM exercises;  discomfort reduced with use of vaso at end of session.  Pt verbalized readiness to d/c after next visit.      Rehab Potential  Excellent    PT Frequency  3x / week    PT Duration  4 weeks    PT Treatment/Interventions  Patient/family education;Therapeutic exercise;Therapeutic activities;Functional mobility training;Gait training;Stair training;Balance training;Neuromuscular re-education;Manual techniques;Scar mobilization;Passive range of motion;Dry needling;Taping;Joint Manipulations;Electrical Stimulation;Cryotherapy;Vasopneumatic Device;Moist Heat;ADLs/Self Care Home Management    PT Next Visit Plan  assess goals; complete FOTO; prepare for d/c.     Consulted and Agree with Plan of Care  Patient       Patient will benefit from skilled therapeutic intervention in order to improve the following deficits and impairments:  Pain, Decreased range of motion, Impaired flexibility, Decreased strength, Difficulty walking, Abnormal gait, Decreased activity tolerance, Decreased balance, Decreased scar mobility  Visit Diagnosis: Acute pain of left knee  Stiffness of left knee, not elsewhere classified  Muscle weakness (generalized)  Other abnormalities of gait and mobility     Problem List Patient Active Problem List   Diagnosis Date Noted  . Unilateral primary osteoarthritis, left knee 10/18/2018  . Status post total left knee replacement 10/18/2018  . Unilateral primary osteoarthritis, right knee 03/28/2018  . Chronic pain of right knee 03/28/2018  . Hx of adenomatous colonic polyps 07/17/2017   Kerin [place]erna, PTA 12/12/18 4:37 PM  [place], [place],  [place] Phone: [phone]   Fax:  [phone]  Name: Kristin Benjamin MRN: [id number] Date of Birth: 01-20-1956

## 2018-12-13 ENCOUNTER — Encounter: Payer: BLUE CROSS/BLUE SHIELD | Admitting: Physical Therapy

## 2018-12-17 ENCOUNTER — Encounter: Payer: BLUE CROSS/BLUE SHIELD | Admitting: Physical Therapy

## 2018-12-19 ENCOUNTER — Ambulatory Visit (INDEPENDENT_AMBULATORY_CARE_PROVIDER_SITE_OTHER): Payer: BLUE CROSS/BLUE SHIELD | Admitting: Physical Therapy

## 2018-12-19 ENCOUNTER — Encounter: Payer: Self-pay | Admitting: Physical Therapy

## 2018-12-19 ENCOUNTER — Encounter: Payer: BLUE CROSS/BLUE SHIELD | Admitting: Physical Therapy

## 2018-12-19 DIAGNOSIS — R2689 Other abnormalities of gait and mobility: Secondary | ICD-10-CM | POA: Diagnosis not present

## 2018-12-19 DIAGNOSIS — M25562 Pain in left knee: Secondary | ICD-10-CM

## 2018-12-19 DIAGNOSIS — M6281 Muscle weakness (generalized): Secondary | ICD-10-CM

## 2018-12-19 DIAGNOSIS — R262 Difficulty in walking, not elsewhere classified: Secondary | ICD-10-CM

## 2018-12-19 DIAGNOSIS — M25662 Stiffness of left knee, not elsewhere classified: Secondary | ICD-10-CM | POA: Diagnosis not present

## 2018-12-19 DIAGNOSIS — R600 Localized edema: Secondary | ICD-10-CM

## 2018-12-19 NOTE — Therapy (Addendum)
[facility] [place], [place], [address] Phone: [phone]   Fax:  [phone]  Physical Therapy Treatment/Discharge  Patient Details  Name: Kristin Benjamin MRN: [id number] Date of Birth: 02/12/1956 Referring Provider (PT): Jean Rosenthal, MD   Encounter Date: 12/19/2018  PT End of Session - 12/19/18 1431    Visit Number  16    Number of Visits  20    Date for PT Re-Evaluation  12/30/18    Authorization Type  BC[employer]    Authorization - Number of Visits  31    PT Start Time  7939    PT Stop Time  1430    PT Time Calculation (min)  32 min    Activity Tolerance  Patient tolerated treatment well    Behavior During Therapy  [facility] for tasks assessed/performed       Past Medical History:  Diagnosis Date  . Anxiety   . Arthritis   . Depression   . Headache    history of  . Heart murmur   . Hx of adenomatous colonic polyps 07/17/2017  . Hyperlipidemia   . Hypertension     Past Surgical History:  Procedure Laterality Date  . bladder tack    . COLONOSCOPY  2006   and 2018  . FUNCTIONAL ENDOSCOPIC SINUS SURGERY    . JOINT REPLACEMENT     Left total knee arthroplasty Dr. Ninfa Linden 10-18-18  . KNEE ARTHROSCOPY W/ MENISCAL REPAIR Right 08/2016  . TOTAL KNEE ARTHROPLASTY Left 10/18/2018   Procedure: LEFT TOTAL KNEE ARTHROPLASTY;  Surgeon: Mcarthur Rossetti, MD;  Location: [facility];  Service: Orthopedics;  Laterality: Left;  . TUBAL LIGATION  1993    There were no vitals filed for this visit.  Subjective Assessment - 12/19/18 1400    Subjective  ready to d/c today.  doing well without any pain.    Patient Stated Goals  Get ROM back & be able to get back to working in the yard & exercise classes    Currently in Pain?  No/denies         [facility] PT Assessment - 12/19/18 1412      Assessment   Medical Diagnosis  L TKR    Referring Provider (PT)  Jean Rosenthal, MD    Onset Date/Surgical Date   10/18/18      AROM   Left Knee Extension  0    Left Knee Flexion  120      Strength   Left Hip Flexion  4+/5    Left Hip Extension  4+/5    Left Hip ABduction  5/5    Left Hip ADduction  5/5    Left Knee Flexion  5/5    Left Knee Extension  5/5                   [facility] Adult PT Treatment/Exercise - 12/19/18 1407      Knee/Hip Exercises: Stretches   Passive Hamstring Stretch  Left;3 reps;30 seconds   supine with strap with overpressure     Knee/Hip Exercises: Aerobic   Recumbent Bike  L3 x 8 min      Knee/Hip Exercises: Machines for Strengthening   Cybex Knee Extension  BLE up and LLE only down, 2 plates 2x10    Cybex Leg Press  LLE 5 plates 2x10    Other Machine  calf raises on leg press 5 plates 2x10      Knee/Hip Exercises: Supine  Heel Slides  AAROM;Left;10 reps   with strap   Heel Slides Limitations  115 degrees AROM prior to exercise; 120 deg AROM following                  PT Long Term Goals - 12/19/18 1431      PT LONG TERM GOAL #1   Title  Independent with ongoing/advanced HEP    Status  Achieved      PT LONG TERM GOAL #2   Title  L knee AROM 0-120 degrees for improved gait pattern and stair negotiation    Status  Achieved      PT LONG TERM GOAL #3   Title  L hip and knee strength >/= 4+/5 for improved stability    Status  Achieved      PT LONG TERM GOAL #4   Title  n/a      PT LONG TERM GOAL #5   Title  n/a            Plan - 12/19/18 1431    Clinical Impression Statement  Pt has met all goals and is ready for d/c at this time.  Pt compliant with HEP for ROM and gym program for strengthening and endurance.  Will d/c PT today.    Rehab Potential  Excellent    PT Frequency  3x / week    PT Duration  4 weeks    PT Treatment/Interventions  Patient/family education;Therapeutic exercise;Therapeutic activities;Functional mobility training;Gait training;Stair training;Balance training;Neuromuscular re-education;Manual  techniques;Scar mobilization;Passive range of motion;Dry needling;Taping;Joint Manipulations;Electrical Stimulation;Cryotherapy;Vasopneumatic Device;Moist Heat;ADLs/Self Care Home Management    PT Next Visit Plan  d/c PT    Consulted and Agree with Plan of Care  Patient       Patient will benefit from skilled therapeutic intervention in order to improve the following deficits and impairments:  Pain, Decreased range of motion, Impaired flexibility, Decreased strength, Difficulty walking, Abnormal gait, Decreased activity tolerance, Decreased balance, Decreased scar mobility  Visit Diagnosis: Acute pain of left knee  Stiffness of left knee, not elsewhere classified  Muscle weakness (generalized)  Other abnormalities of gait and mobility  Difficulty in walking, not elsewhere classified  Localized edema     Problem List Patient Active Problem List   Diagnosis Date Noted  . Unilateral primary osteoarthritis, left knee 10/18/2018  . Status post total left knee replacement 10/18/2018  . Unilateral primary osteoarthritis, right knee 03/28/2018  . Chronic pain of right knee 03/28/2018  . Hx of adenomatous colonic polyps 07/17/2017      Laureen Abrahams, PT, DPT 12/19/18 2:38 PM    [facility] [place], [place], [address] Phone: [phone]   Fax:  [phone]  Name: Kristin Benjamin MRN: [id number] Date of Birth: 1955-12-06     PHYSICAL THERAPY DISCHARGE SUMMARY  Visits from Start of Care: 16  Current functional level related to goals / functional outcomes: See above   Remaining deficits: See above; none   Education / Equipment: HEP  Plan: Patient agrees to discharge.  Patient goals were met. Patient is being discharged due to meeting the stated rehab goals.  ?????     Laureen Abrahams, PT, DPT 12/19/18 2:38 PM  [facility] [place], [place] [address]  [phone] (office) [phone] (fax)

## 2019-01-01 DIAGNOSIS — E559 Vitamin D deficiency, unspecified: Secondary | ICD-10-CM | POA: Diagnosis not present

## 2019-01-01 DIAGNOSIS — N390 Urinary tract infection, site not specified: Secondary | ICD-10-CM | POA: Diagnosis not present

## 2019-01-01 DIAGNOSIS — M62838 Other muscle spasm: Secondary | ICD-10-CM | POA: Diagnosis not present

## 2019-01-01 DIAGNOSIS — Z0000 Encounter for general adult medical examination without abnormal findings: Secondary | ICD-10-CM | POA: Diagnosis not present

## 2019-01-01 DIAGNOSIS — F909 Attention-deficit hyperactivity disorder, unspecified type: Secondary | ICD-10-CM | POA: Diagnosis not present

## 2019-01-01 DIAGNOSIS — I10 Essential (primary) hypertension: Secondary | ICD-10-CM | POA: Diagnosis not present

## 2019-01-01 DIAGNOSIS — E785 Hyperlipidemia, unspecified: Secondary | ICD-10-CM | POA: Diagnosis not present

## 2019-01-05 DIAGNOSIS — J019 Acute sinusitis, unspecified: Secondary | ICD-10-CM | POA: Diagnosis not present

## 2019-01-05 DIAGNOSIS — H6692 Otitis media, unspecified, left ear: Secondary | ICD-10-CM | POA: Diagnosis not present

## 2019-02-27 ENCOUNTER — Ambulatory Visit (INDEPENDENT_AMBULATORY_CARE_PROVIDER_SITE_OTHER): Payer: Self-pay

## 2019-02-27 ENCOUNTER — Other Ambulatory Visit: Payer: Self-pay

## 2019-02-27 ENCOUNTER — Ambulatory Visit (INDEPENDENT_AMBULATORY_CARE_PROVIDER_SITE_OTHER): Payer: BLUE CROSS/BLUE SHIELD | Admitting: Orthopaedic Surgery

## 2019-02-27 ENCOUNTER — Encounter (INDEPENDENT_AMBULATORY_CARE_PROVIDER_SITE_OTHER): Payer: Self-pay | Admitting: Orthopaedic Surgery

## 2019-02-27 ENCOUNTER — Telehealth (INDEPENDENT_AMBULATORY_CARE_PROVIDER_SITE_OTHER): Payer: Self-pay

## 2019-02-27 DIAGNOSIS — M25561 Pain in right knee: Secondary | ICD-10-CM | POA: Diagnosis not present

## 2019-02-27 DIAGNOSIS — M1711 Unilateral primary osteoarthritis, right knee: Secondary | ICD-10-CM | POA: Diagnosis not present

## 2019-02-27 DIAGNOSIS — Z96652 Presence of left artificial knee joint: Secondary | ICD-10-CM

## 2019-02-27 DIAGNOSIS — G8929 Other chronic pain: Secondary | ICD-10-CM

## 2019-02-27 MED ORDER — LIDOCAINE HCL 1 % IJ SOLN
3.0000 mL | INTRAMUSCULAR | Status: AC | PRN
  Administered 2019-02-27: 09:00:00 3 mL

## 2019-02-27 MED ORDER — METHYLPREDNISOLONE ACETATE 40 MG/ML IJ SUSP
40.0000 mg | INTRAMUSCULAR | Status: AC | PRN
  Administered 2019-02-27: 09:00:00 40 mg via INTRA_ARTICULAR

## 2019-02-27 NOTE — Telephone Encounter (Signed)
Right knee gel injection  

## 2019-02-27 NOTE — Progress Notes (Signed)
Office Visit Note   Patient: Kristin Benjamin           Date of Birth: Jun 26, 1956           MRN: [id number] Visit Date: 02/27/2019              Requested by: Josetta Huddle, MD [address] [place], [place] [address] PCP: Josetta Huddle, MD   Assessment & Plan: Visit Diagnoses:  1. History of left knee replacement   2. Unilateral primary osteoarthritis, right knee   3. Chronic pain of right knee     Plan: I was able to place a steroid injection in her right knee per her wishes today.  We will order hyaluronic acid to treat the pain from her arthritis in the right knee.  She will continue increase her activities as comfort allows.  We will see her back in 4 weeks to hopefully place hyaluronic acid into the right knee.  All question concerns were answered and addressed.  Follow-Up Instructions: Return in about 4 weeks (around 03/27/2019).   Orders:  Orders Placed This Encounter  Procedures  . Large Joint Inj  . XR Knee 1-2 Views Left   No orders of the defined types were placed in this encounter.     Procedures: Large Joint Inj: R knee on 02/27/2019 8:52 AM Indications: diagnostic evaluation and pain Details: 22 G 1.5 in needle, superolateral approach  Arthrogram: No  Medications: 3 mL lidocaine 1 %; 40 mg methylPREDNISolone acetate 40 MG/ML Outcome: tolerated well, no immediate complications Procedure, treatment alternatives, risks and benefits explained, specific risks discussed. Consent was given by the patient. Immediately prior to procedure a time out was called to verify the correct patient, procedure, equipment, support staff and site/side marked as required. Patient was prepped and draped in the usual sterile fashion.       Clinical Data: No additional findings.   Subjective: Chief Complaint  Patient presents with  . Left Knee - Follow-up  Patient is a very pleasant 63 year old female who is 4 months status post a left total knee arthroplasty.   She is doing very well with her left knee and has no issues with it at all.  She is been back to swimming as well as other exercises.  She does have a painful arthritic right knee and would like to have a steroid injection in that knee and again hyaluronic acid at a later date given the help with that is done for her right knee.  Again as far as her left knee goes, she is very pleased with it thus far.  HPI  Review of Systems She currently denies any headache, chest pain, shortness of breath, fever, chills, nausea, vomiting  Objective: Vital Signs: There were no vitals taken for this visit.  Physical Exam She is alert and orient x3 and in no acute distress Ortho Exam Examination of her left knee shows that her range of motion is full.  There is no effusion.  Her incisions healed nicely.  There is slight numbness lateral to the incision.  There is no effusion and no redness.  Examination right knee shows slight varus malalignment that is easily correctable.  She has medial joint line tenderness and some patellofemoral crepitation. Specialty Comments:  No specialty comments available.  Imaging: Xr Knee 1-2 Views Left  Result Date: 02/27/2019 2 views the right knee show a well-seated total knee arthroplasty with no complicating features or malalignment.  There is no effusion.  PMFS History: Patient Active Problem List   Diagnosis Date Noted  . Unilateral primary osteoarthritis, left knee 10/18/2018  . Status post total left knee replacement 10/18/2018  . Unilateral primary osteoarthritis, right knee 03/28/2018  . Chronic pain of right knee 03/28/2018  . Hx of adenomatous colonic polyps 07/17/2017   Past Medical History:  Diagnosis Date  . Anxiety   . Arthritis   . Depression   . Headache    history of  . Heart murmur   . Hx of adenomatous colonic polyps 07/17/2017  . Hyperlipidemia   . Hypertension     Family History  Problem Relation Age of Onset  . Colon cancer Neg Hx    . Esophageal cancer Neg Hx   . Rectal cancer Neg Hx   . Stomach cancer Neg Hx     Past Surgical History:  Procedure Laterality Date  . bladder tack    . COLONOSCOPY  2006   and 2018  . FUNCTIONAL ENDOSCOPIC SINUS SURGERY    . JOINT REPLACEMENT     Left total knee arthroplasty Dr. Ninfa Linden 10-18-18  . KNEE ARTHROSCOPY W/ MENISCAL REPAIR Right 08/2016  . TOTAL KNEE ARTHROPLASTY Left 10/18/2018   Procedure: LEFT TOTAL KNEE ARTHROPLASTY;  Surgeon: Mcarthur Rossetti, MD;  Location: [facility];  Service: Orthopedics;  Laterality: Left;  . TUBAL LIGATION  1993   Social History   Occupational History  . Not on file  Tobacco Use  . Smoking status: Current Every Day Smoker    Packs/day: 1.00    Types: Cigarettes  . Smokeless tobacco: Never Used  Substance and Sexual Activity  . Alcohol use: Yes    Alcohol/week: 3.0 - 5.0 standard drinks    Types: 3 - 5 Glasses of wine per week    Comment: 3-5 wine per week per pt  . Drug use: Not Currently    Types: Marijuana    Comment: occasionally  . Sexual activity: Yes

## 2019-03-05 NOTE — Telephone Encounter (Signed)
Noted  

## 2019-03-11 ENCOUNTER — Telehealth (INDEPENDENT_AMBULATORY_CARE_PROVIDER_SITE_OTHER): Payer: Self-pay

## 2019-03-11 DIAGNOSIS — Z01419 Encounter for gynecological examination (general) (routine) without abnormal findings: Secondary | ICD-10-CM | POA: Diagnosis not present

## 2019-03-11 DIAGNOSIS — Z1231 Encounter for screening mammogram for malignant neoplasm of breast: Secondary | ICD-10-CM | POA: Diagnosis not present

## 2019-03-11 DIAGNOSIS — Z6824 Body mass index (BMI) 24.0-24.9, adult: Secondary | ICD-10-CM | POA: Diagnosis not present

## 2019-03-11 NOTE — Telephone Encounter (Signed)
Submitted VOB for SynviscOne, right knee. 

## 2019-03-12 ENCOUNTER — Telehealth (INDEPENDENT_AMBULATORY_CARE_PROVIDER_SITE_OTHER): Payer: Self-pay

## 2019-03-12 NOTE — Telephone Encounter (Signed)
Talked with patient and advised her that per VOB that her insurance was termed on 02/22/2019 and that Kristin Benjamin was not covered by her insurance.   Patient stated that she has [place] was active as of 02/22/2019.  Patient provided me with a new group number for her insurance.  Advised patient that I would resubmit for SynviscOne, due to insurance change.

## 2019-03-19 ENCOUNTER — Telehealth: Payer: Self-pay

## 2019-03-19 NOTE — Telephone Encounter (Signed)
Called and tried to leave a VM for patient to call back concerning gel injection, but no answer and VM was full.  Will try again.  Patient is approved for SynviscOne, right knee. Buy & Bill Covered at 80% after deductible has been met. Patient will be responsible for 20% OOP. No Co-pay No PA required  Appt. 03/27/2019 with Ninfa Linden                                                       will be responsible for 20% OOP.

## 2019-03-27 ENCOUNTER — Other Ambulatory Visit: Payer: Self-pay

## 2019-03-27 ENCOUNTER — Encounter: Payer: Self-pay | Admitting: Orthopaedic Surgery

## 2019-03-27 ENCOUNTER — Ambulatory Visit: Payer: BLUE CROSS/BLUE SHIELD | Admitting: Orthopaedic Surgery

## 2019-03-27 DIAGNOSIS — M1711 Unilateral primary osteoarthritis, right knee: Secondary | ICD-10-CM | POA: Diagnosis not present

## 2019-03-27 MED ORDER — HYLAN G-F 20 48 MG/6ML IX SOSY
48.0000 mg | PREFILLED_SYRINGE | INTRA_ARTICULAR | Status: AC | PRN
  Administered 2019-03-27: 48 mg via INTRA_ARTICULAR

## 2019-03-27 MED ORDER — LIDOCAINE HCL 1 % IJ SOLN
0.5000 mL | INTRAMUSCULAR | Status: AC | PRN
  Administered 2019-03-27: 14:00:00 .5 mL

## 2019-03-27 NOTE — Progress Notes (Signed)
   Procedure Note  Patient: Kristin Benjamin             Date of Birth: Mar 13, 1956           MRN: [id number]             Visit Date: 03/27/2019 HPI: Kristin Benjamin returns today for Synvisc 1 injection right knee.  She is status post left total knee arthroplasty 10/18/2018.  She reports her left knee is doing well.  She is having increased pain in her right knee which she has known osteoarthritis.  She is had no new injury.  Denies any fevers or chills.  Physical exam: Right knee good range of motion.  No effusion abnormal warmth erythema. Procedures: Visit Diagnoses: Unilateral primary osteoarthritis, right knee  Large Joint Inj: R knee on 03/27/2019 1:48 PM Indications: pain Details: 22 G 1.5 in needle, anterolateral approach  Arthrogram: No  Medications: 0.5 mL lidocaine 1 %; 48 mg Hylan 48 MG/6ML Outcome: tolerated well, no immediate complications Procedure, treatment alternatives, risks and benefits explained, specific risks discussed. Consent was given by the patient. Immediately prior to procedure a time out was called to verify the correct patient, procedure, equipment, support staff and site/side marked as required. Patient was prepped and draped in the usual sterile fashion.    Plan: She will work on Forensic scientist.  Follow-up with [place] on an as-needed basis.  She understands that she can have cortisone injections in the right knee every 3 months and supplemental injection every 6 months.  Questions were encouraged and answered.

## 2019-05-30 DIAGNOSIS — R0981 Nasal congestion: Secondary | ICD-10-CM | POA: Diagnosis not present

## 2019-06-12 DIAGNOSIS — R0981 Nasal congestion: Secondary | ICD-10-CM | POA: Diagnosis not present

## 2019-06-27 DIAGNOSIS — R350 Frequency of micturition: Secondary | ICD-10-CM | POA: Diagnosis not present

## 2019-07-03 ENCOUNTER — Encounter: Payer: Self-pay | Admitting: Orthopaedic Surgery

## 2019-07-03 DIAGNOSIS — G4700 Insomnia, unspecified: Secondary | ICD-10-CM | POA: Diagnosis not present

## 2019-07-03 DIAGNOSIS — F909 Attention-deficit hyperactivity disorder, unspecified type: Secondary | ICD-10-CM | POA: Diagnosis not present

## 2019-07-03 DIAGNOSIS — F419 Anxiety disorder, unspecified: Secondary | ICD-10-CM | POA: Diagnosis not present

## 2019-07-03 DIAGNOSIS — N390 Urinary tract infection, site not specified: Secondary | ICD-10-CM | POA: Diagnosis not present

## 2019-08-06 DIAGNOSIS — Z23 Encounter for immunization: Secondary | ICD-10-CM | POA: Diagnosis not present

## 2019-09-22 ENCOUNTER — Ambulatory Visit (INDEPENDENT_AMBULATORY_CARE_PROVIDER_SITE_OTHER): Payer: BC Managed Care – PPO | Admitting: Orthopaedic Surgery

## 2019-09-22 ENCOUNTER — Encounter: Payer: Self-pay | Admitting: Orthopaedic Surgery

## 2019-09-22 ENCOUNTER — Ambulatory Visit: Payer: Self-pay

## 2019-09-22 ENCOUNTER — Other Ambulatory Visit: Payer: Self-pay

## 2019-09-22 DIAGNOSIS — G8929 Other chronic pain: Secondary | ICD-10-CM

## 2019-09-22 DIAGNOSIS — M1711 Unilateral primary osteoarthritis, right knee: Secondary | ICD-10-CM | POA: Diagnosis not present

## 2019-09-22 DIAGNOSIS — M25561 Pain in right knee: Secondary | ICD-10-CM

## 2019-09-22 MED ORDER — LIDOCAINE HCL 1 % IJ SOLN
3.0000 mL | INTRAMUSCULAR | Status: AC | PRN
  Administered 2019-09-22: 3 mL

## 2019-09-22 MED ORDER — LIDOCAINE HCL 1 % IJ SOLN
1.0000 mL | INTRAMUSCULAR | Status: AC | PRN
  Administered 2019-09-22: 1 mL

## 2019-09-22 MED ORDER — METHYLPREDNISOLONE ACETATE 40 MG/ML IJ SUSP
40.0000 mg | INTRAMUSCULAR | Status: AC | PRN
  Administered 2019-09-22: 40 mg via INTRA_ARTICULAR

## 2019-09-22 NOTE — Progress Notes (Signed)
Office Visit Note   Patient: Kristin Benjamin           Date of Birth: January 29, 1956           MRN: [id number] Visit Date: 09/22/2019              Requested by: Josetta Huddle, MD [address] [place],  [place] [address] PCP: Josetta Huddle, MD   Assessment & Plan: Visit Diagnoses:  1. Chronic pain of right knee   2. Unilateral primary osteoarthritis, right knee     Plan: I would like her to work on quad strengthening exercises as well as using Voltaren gel on the medial aspect of her knee.  I did talk to her about trying a steroid injection over the pes bursa area where she is having most of her pain and she tolerated this well.  All question concerns were answered and addressed.  Follow-up will be as needed.  If things worsen anyway she will come see [place].  Follow-Up Instructions: Return if symptoms worsen or fail to improve.   Orders:  Orders Placed This Encounter  Procedures  . Large Joint Inj  . XR Knee 1-2 Views Right   No orders of the defined types were placed in this encounter.     Procedures: Large Joint Inj: R knee on 09/22/2019 3:50 PM Indications: diagnostic evaluation and pain Details: 22 G 1.5 in needle, superolateral approach  Arthrogram: No  Medications: 3 mL lidocaine 1 %; 40 mg methylPREDNISolone acetate 40 MG/ML; 1 mL lidocaine 1 % Outcome: tolerated well, no immediate complications Procedure, treatment alternatives, risks and benefits explained, specific risks discussed. Consent was given by the patient. Immediately prior to procedure a time out was called to verify the correct patient, procedure, equipment, support staff and site/side marked as required. Patient was prepped and draped in the usual sterile fashion.       Clinical Data: No additional findings.   Subjective: Chief Complaint  Patient presents with  . Right Knee - Pain  The patient comes in today for evaluation treatment of right knee pain.  We actually seen her for this  knee before.  She has had arthroscopic intervention in the past.  She has had steroid injections and Synvisc 1.  The knee has been buckling a lot as well.  Her previous MRI and arthroscopic pictures showed some significant cartilage changes in the medial compartment of her knee.  She still is very active and does a lot of yoga.  She is only 63 years old.  She is 11 months out from a left total knee arthroplasty that is done very well for her. She points the medial joint line is source of her pain. HPI  Review of Systems She currently denies any headache, chest pain, shortness of breath, fever, chills, nausea, vomiting  Objective: Vital Signs: There were no vitals taken for this visit.  Physical Exam She is alert and orient x3 and in no acute distress Ortho Exam Examination of her right knee today shows no ligamentous instability.  There is medial joint line tenderness and pain mainly over the pes bursa and the medial collateral ligament.  Her range of motion is full and there is no effusion.  Her left operative total knee has excellent range of motion with no effusion. Specalty Comments:  No specialty comments available.  Imaging: Xr Knee 1-2 Views Right  Result Date: 09/22/2019 2 views of the right knee show no acute findings.  There  are small periarticular osteophytes medially but the joint space is still maintained.  There is patellofemoral arthritic changes.    PMFS History: Patient Active Problem List   Diagnosis Date Noted  . Unilateral primary osteoarthritis, left knee 10/18/2018  . Status post total left knee replacement 10/18/2018  . Unilateral primary osteoarthritis, right knee 03/28/2018  . Chronic pain of right knee 03/28/2018  . Hx of adenomatous colonic polyps 07/17/2017   Past Medical History:  Diagnosis Date  . Anxiety   . Arthritis   . Depression   . Headache    history of  . Heart murmur   . Hx of adenomatous colonic polyps 07/17/2017  . Hyperlipidemia   .  Hypertension     Family History  Problem Relation Age of Onset  . Colon cancer Neg Hx   . Esophageal cancer Neg Hx   . Rectal cancer Neg Hx   . Stomach cancer Neg Hx     Past Surgical History:  Procedure Laterality Date  . bladder tack    . COLONOSCOPY  2006   and 2018  . FUNCTIONAL ENDOSCOPIC SINUS SURGERY    . JOINT REPLACEMENT     Left total knee arthroplasty Dr. Ninfa Linden 10-18-18  . KNEE ARTHROSCOPY W/ MENISCAL REPAIR Right 08/2016  . TOTAL KNEE ARTHROPLASTY Left 10/18/2018   Procedure: LEFT TOTAL KNEE ARTHROPLASTY;  Surgeon: Mcarthur Rossetti, MD;  Location: [facility];  Service: Orthopedics;  Laterality: Left;  . TUBAL LIGATION  1993   Social History   Occupational History  . Not on file  Tobacco Use  . Smoking status: Current Every Day Smoker    Packs/day: 1.00    Types: Cigarettes  . Smokeless tobacco: Never Used  Substance and Sexual Activity  . Alcohol use: Yes    Alcohol/week: 3.0 - 5.0 standard drinks    Types: 3 - 5 Glasses of wine per week    Comment: 3-5 wine per week per pt  . Drug use: Not Currently    Types: Marijuana    Comment: occasionally  . Sexual activity: Yes

## 2019-11-11 DIAGNOSIS — Z23 Encounter for immunization: Secondary | ICD-10-CM | POA: Diagnosis not present

## 2020-02-16 ENCOUNTER — Other Ambulatory Visit: Payer: Self-pay

## 2020-02-16 ENCOUNTER — Encounter: Payer: Self-pay | Admitting: Orthopaedic Surgery

## 2020-02-16 ENCOUNTER — Ambulatory Visit (INDEPENDENT_AMBULATORY_CARE_PROVIDER_SITE_OTHER): Payer: BC Managed Care – PPO | Admitting: Orthopaedic Surgery

## 2020-02-16 DIAGNOSIS — G8929 Other chronic pain: Secondary | ICD-10-CM | POA: Diagnosis not present

## 2020-02-16 DIAGNOSIS — M7051 Other bursitis of knee, right knee: Secondary | ICD-10-CM | POA: Diagnosis not present

## 2020-02-16 DIAGNOSIS — M25561 Pain in right knee: Secondary | ICD-10-CM | POA: Diagnosis not present

## 2020-02-16 MED ORDER — LIDOCAINE HCL 1 % IJ SOLN
3.0000 mL | INTRAMUSCULAR | Status: AC | PRN
  Administered 2020-02-16: 09:00:00 3 mL

## 2020-02-16 MED ORDER — METHYLPREDNISOLONE ACETATE 40 MG/ML IJ SUSP
40.0000 mg | INTRAMUSCULAR | Status: AC | PRN
  Administered 2020-02-16: 09:00:00 40 mg via INTRA_ARTICULAR

## 2020-02-16 NOTE — Progress Notes (Signed)
Office Visit Note   Patient: Kristin Benjamin           Date of Birth: 01/15/1956           MRN: [id number] Visit Date: 02/16/2020              Requested by: Kristin Huddle, MD [address] [place],   [address] PCP: Kristin Huddle, MD   Assessment & Plan: Visit Diagnoses:  1. Chronic pain of right knee   2. Pes anserinus bursitis of right knee     Plan: Per her request I did provide a steroid injection over the pes bursa area of her right knee.  I recommend her exercise routine involve only walking on flat surfaces for now.  She may want to look at her shoe wear.  Go to a place like [employer] to assess how she walks in terms of her shoe wear and possibility of needing other inserts than what she has.  We had a long and thorough discussion about this.  All question concerns were answered and addressed.  She did tolerate the injection well.  She understands the risk and benefits of steroid injections.  Follow-up will be as needed.  Follow-Up Instructions: Return if symptoms worsen or fail to improve.   Orders:  Orders Placed This Encounter  Procedures  . Large Joint Inj   No orders of the defined types were placed in this encounter.     Procedures: Large Joint Inj: R knee on 02/16/2020 8:54 AM Indications: diagnostic evaluation and pain Details: 22 G 1.5 in needle, superolateral approach  Arthrogram: No  Medications: 3 mL lidocaine 1 %; 40 mg methylPREDNISolone acetate 40 MG/ML Outcome: tolerated well, no immediate complications Procedure, treatment alternatives, risks and benefits explained, specific risks discussed. Consent was given by the patient. Immediately prior to procedure a time out was called to verify the correct patient, procedure, equipment, support staff and site/side marked as required. Patient was prepped and draped in the usual sterile fashion.       Clinical Data: No additional findings.   Subjective: Chief Complaint  Patient  presents with  . Right Knee - Follow-up  Kristin Benjamin is well-known to me.  She has been dealing with right knee pain for some time.  It does flareup from time to time.  She does have some arthritis in that right knee but most of her pain is at the pes bursa area.  I injected this area before and is helped quite a bit.  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  The last injection she had was about 5 months ago.  She has had no other acute change in her medical status.  HPI  Review of Systems She currently denies any headache, chest pain, shortness of breath, fever, chills, nausea, vomiting  Objective: Vital Signs: There were no vitals taken for this visit.  Physical Exam She is alert and orient x3 and in no acute distress Ortho Exam Examination of her right knee shows no pain of the joint line at all.  Her pain is over the pes bursa only.  Her range of motion is full of the right knee and her right knee is ligamentously stable. Specialty Comments:  No specialty comments available.  Imaging: No results found.   PMFS History: Patient Active Problem List   Diagnosis Date Noted  . Pes anserinus bursitis of right knee 02/16/2020  . Unilateral  primary osteoarthritis, left knee 10/18/2018  . Status post total left knee replacement 10/18/2018  . Unilateral primary osteoarthritis, right knee 03/28/2018  . Chronic pain of right knee 03/28/2018  . Hx of adenomatous colonic polyps 07/17/2017   Past Medical History:  Diagnosis Date  . Anxiety   . Arthritis   . Depression   . Headache    history of  . Heart murmur   . Hx of adenomatous colonic polyps 07/17/2017  . Hyperlipidemia   . Hypertension     Family History  Problem Relation Age of Onset  . Colon cancer Neg Hx   . Esophageal cancer Neg Hx   . Rectal cancer Neg Hx   . Stomach cancer Neg Hx     Past Surgical History:  Procedure Laterality Date  . bladder tack    . COLONOSCOPY  2006   and  2018  . FUNCTIONAL ENDOSCOPIC SINUS SURGERY    . JOINT REPLACEMENT     Left total knee arthroplasty Dr. Ninfa Linden 10-18-18  . KNEE ARTHROSCOPY W/ MENISCAL REPAIR Right 08/2016  . TOTAL KNEE ARTHROPLASTY Left 10/18/2018   Procedure: LEFT TOTAL KNEE ARTHROPLASTY;  Surgeon: Mcarthur Rossetti, MD;  Location: [facility];  Service: Orthopedics;  Laterality: Left;  . TUBAL LIGATION  1993   Social History   Occupational History  . Not on file  Tobacco Use  . Smoking status: Current Every Day Smoker    Packs/day: 1.00    Types: Cigarettes  . Smokeless tobacco: Never Used  Substance and Sexual Activity  . Alcohol use: Yes    Alcohol/week: 3.0 - 5.0 standard drinks    Types: 3 - 5 Glasses of wine per week    Comment: 3-5 wine per week per pt  . Drug use: Not Currently    Types: Marijuana    Comment: occasionally  . Sexual activity: Yes

## 2020-05-18 IMAGING — MR MR KNEE*L* W/O CM
10 series · 40 of 40 positions shown · non-contrast
Comparison: 04/07/2011

CLINICAL DATA: Diffuse knee pain and swelling.

EXAM:
MRI OF THE LEFT KNEE WITHOUT CONTRAST
TECHNIQUE: Multiplanar, multisequence MR imaging of the knee was performed. No
intravenous contrast was administered.

[Series 4: T2 fat-sat · axial · 4.0mm · 0.62mm/px · z∈[-73,+46]mm · 4 of 25 slices shown (1 of 4)]
[im 1/25]
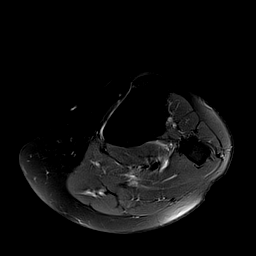
[im 9/25]
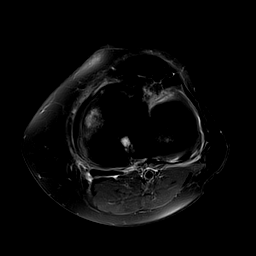
[im 17/25]
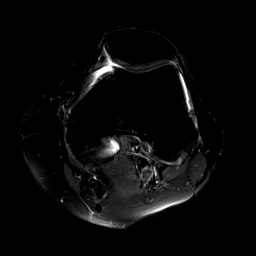
[im 25/25]
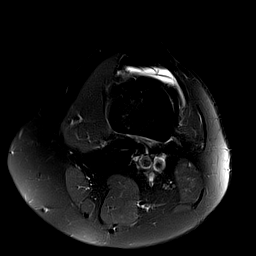

[Series 5: T1 · coronal · 4.0mm · 0.47mm/px · 4 of 23 slices shown]
[im 1/23]
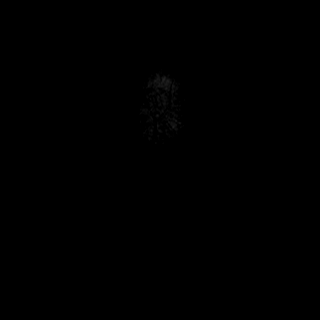
[im 8/23]
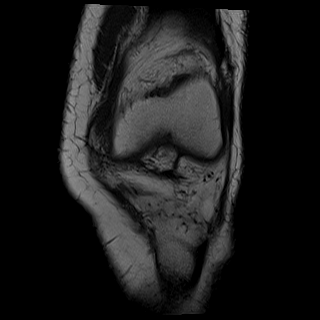
[im 15/23]
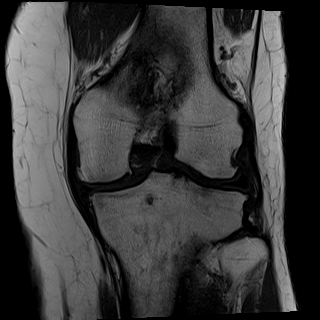
[im 23/23]
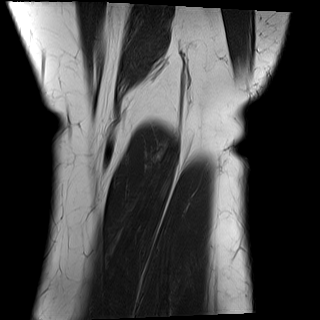

[Series 6: T2 fat-sat · coronal · 4.0mm · 0.59mm/px · 4 of 22 slices shown (2 of 4)]
[im 1/22]
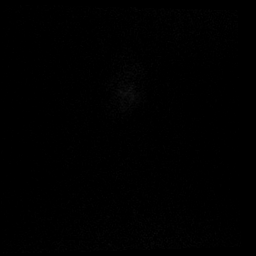
[im 8/22]
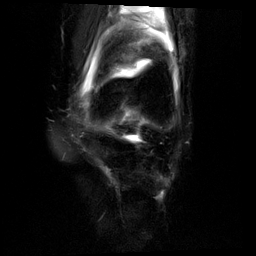
[im 15/22]
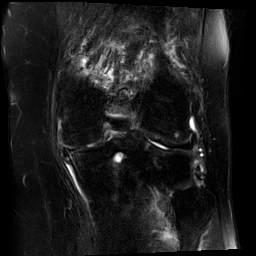
[im 22/22]
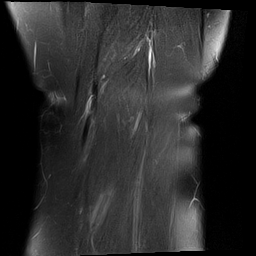

[Series 7: PD fat-sat · coronal · 3.0mm · 0.59mm/px · 5 of 28 slices shown (1 of 3)]
[im 1/28]
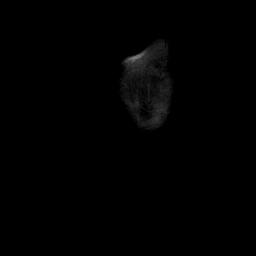
[im 7/28]
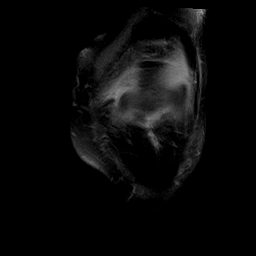
[im 14/28]
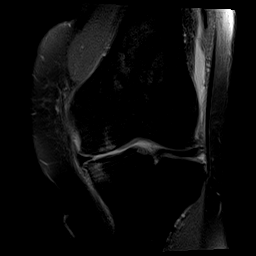
[im 21/28]
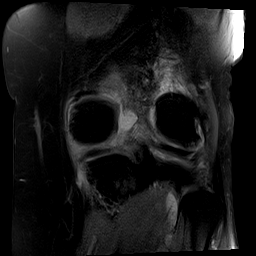
[im 28/28]
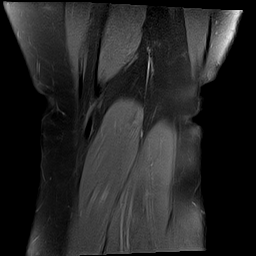

[Series 8: PD fat-sat · sagittal · 3.0mm · 0.59mm/px · 5 of 28 slices shown (2 of 3)]
[im 1/28]
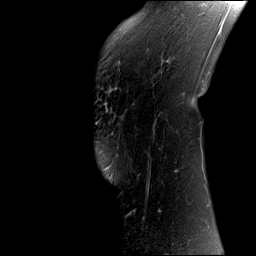
[im 7/28]
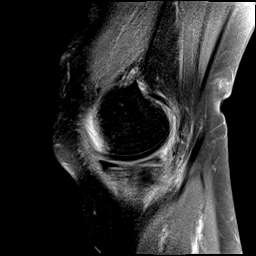
[im 14/28]
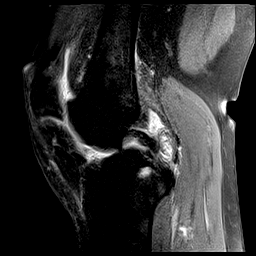
[im 21/28]
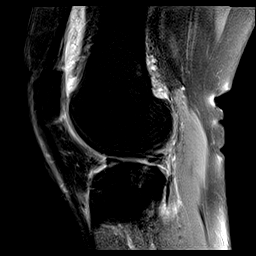
[im 28/28]
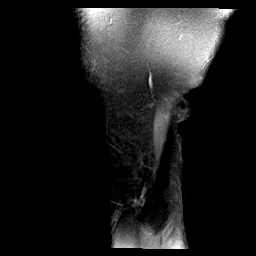

[Series 9: T2 fat-sat · sagittal · 3.0mm · 0.59mm/px · 5 of 28 slices shown (3 of 4)]
[im 1/28]
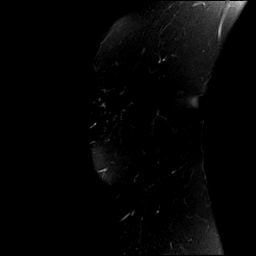
[im 7/28]
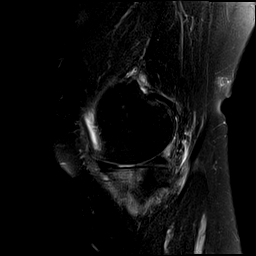
[im 14/28]
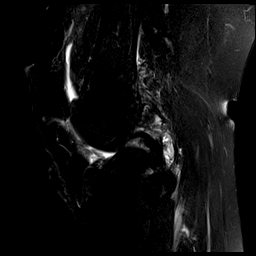
[im 21/28]
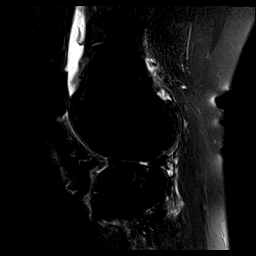
[im 28/28]
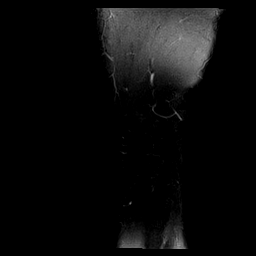

[Series 10: PD · coronal · 2.0mm · 0.47mm/px · 3 of 16 slices shown]
[im 1/16]
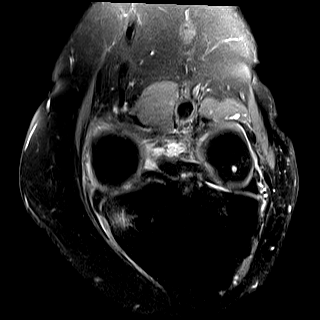
[im 8/16]
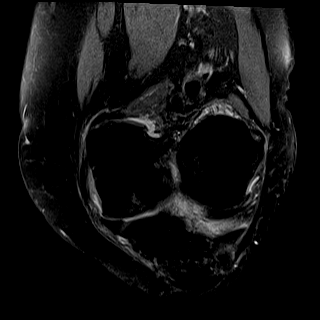
[im 16/16]
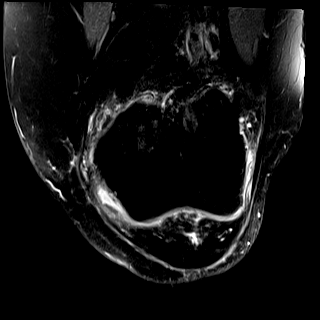

[Series 11: T2 fat-sat · coronal · 4.0mm · 0.59mm/px · 4 of 23 slices shown (4 of 4)]
[im 1/23]
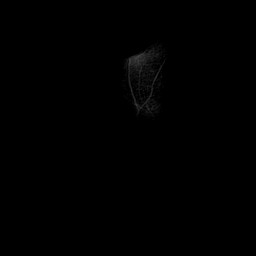
[im 8/23]
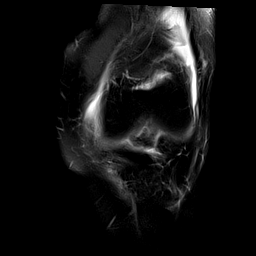
[im 15/23]
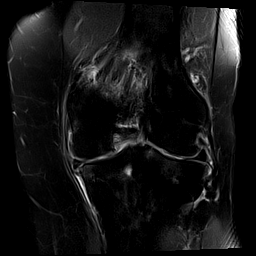
[im 23/23]
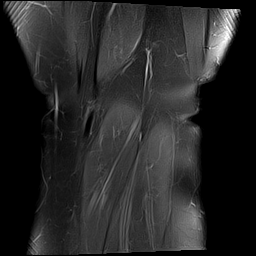

[Series 12: PD fat-sat · sagittal · 3.0mm · 0.59mm/px · 5 of 28 slices shown (3 of 3)]
[im 1/28]
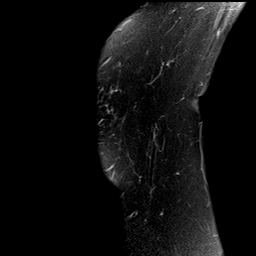
[im 7/28]
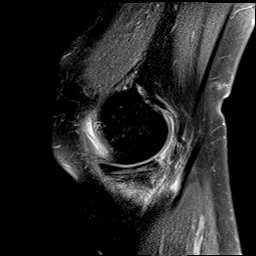
[im 14/28]
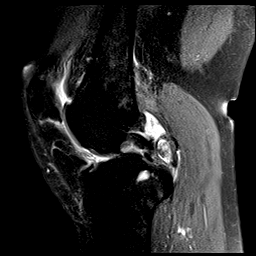
[im 21/28]
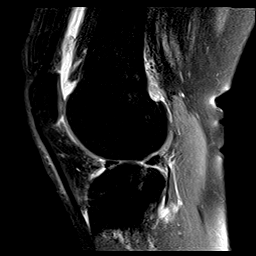
[im 28/28]
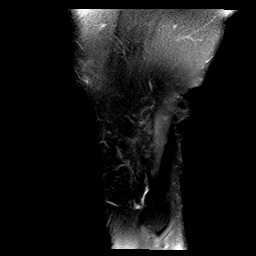

[Series 101: hx · axial · 8.0mm · 0.68mm/px · 1 of 5 slices shown]
[im 1/5]
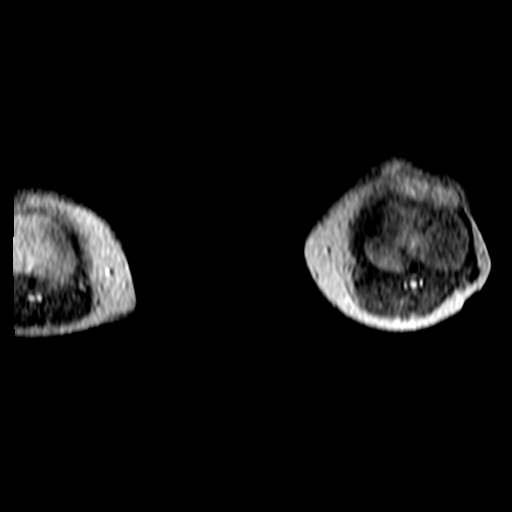

[40 of 40 positions shown; findings below may reference images not displayed]

FINDINGS: Exam is quite limited by patient motion.

MENISCI

Medial meniscus: Fairly extensive inferior articular surface tear
involving the posterior horn and mid body region of the meniscus.
There is also a small flap type component in the midbody region with
a small flipped meniscal fragment in the medial gutter.

Lateral meniscus: Horizontal superior articular surface tear
involving the midbody region.

LIGAMENTS

Cruciates:  Intact

Collaterals:  Intact

CARTILAGE

Patellofemoral:  Mild degenerative chondrosis.

Medial: Moderate to advanced degenerative chondrosis with areas of
full or near full-thickness cartilage loss, joint space narrowing,
osteophytes and subchondral cystic change. This is significantly
progressive when compared to the prior MRI.

Lateral: Moderate degenerative chondrosis with early joint space
narrowing and spurring.

Joint:  Small to moderate-sized joint effusion and mild synovitis.

Popliteal Fossa:  No popliteal mass or Baker's cyst.

Extensor Mechanism: The patella retinacular structures are intact
and the quadriceps and patellar tendons are intact.

Bones:  No acute bony findings.

Other: Normal knee musculature.
IMPRESSION: 1. Medial and lateral meniscus tears.
2. Significant medial compartment degenerative changes in
particular, progressive since prior study.
3. Intact ligamentous structures and no acute bony findings.
4. Moderate-sized joint effusion and moderate synovitis.

## 2020-07-05 ENCOUNTER — Ambulatory Visit: Payer: 59 | Admitting: Orthopaedic Surgery

## 2020-07-08 ENCOUNTER — Ambulatory Visit (INDEPENDENT_AMBULATORY_CARE_PROVIDER_SITE_OTHER): Payer: 59 | Admitting: Orthopaedic Surgery

## 2020-07-08 ENCOUNTER — Encounter: Payer: Self-pay | Admitting: Orthopaedic Surgery

## 2020-07-08 DIAGNOSIS — M1711 Unilateral primary osteoarthritis, right knee: Secondary | ICD-10-CM | POA: Diagnosis not present

## 2020-07-08 DIAGNOSIS — Z96652 Presence of left artificial knee joint: Secondary | ICD-10-CM | POA: Diagnosis not present

## 2020-07-08 DIAGNOSIS — G8929 Other chronic pain: Secondary | ICD-10-CM | POA: Diagnosis not present

## 2020-07-08 DIAGNOSIS — M25561 Pain in right knee: Secondary | ICD-10-CM

## 2020-07-08 MED ORDER — METHYLPREDNISOLONE ACETATE 40 MG/ML IJ SUSP
40.0000 mg | INTRAMUSCULAR | Status: AC | PRN
  Administered 2020-07-08: 40 mg via INTRA_ARTICULAR

## 2020-07-08 MED ORDER — LIDOCAINE HCL 1 % IJ SOLN
3.0000 mL | INTRAMUSCULAR | Status: AC | PRN
  Administered 2020-07-08: 3 mL

## 2020-07-08 NOTE — Progress Notes (Signed)
Office Visit Note   Patient: Kristin Benjamin           Date of Birth: Sep 26, 1956           MRN: [id number] Visit Date: 07/08/2020              Requested by: Josetta Huddle, MD [address] [place],  [place] [address] PCP: Josetta Huddle, MD   Assessment & Plan: Visit Diagnoses:  1. Chronic pain of right knee   2. Unilateral primary osteoarthritis, right knee   3. Status post total left knee replacement     Plan: We talked about her right knee in detail.  I agree with trying a steroid injection in that knee today since she will be traveling and also third weeks and doing a lot of walking.  All questions and concerns were answered addressed.  She can still wear her knee brace intermittently.  Follow-up can be as needed.  If things worsen with the right knee she will let us know.  Follow-Up Instructions: Return if symptoms worsen or fail to improve.   Orders:  Orders Placed This Encounter  Procedures  . Large Joint Inj   No orders of the defined types were placed in this encounter.     Procedures: Large Joint Inj: R knee on 07/08/2020 2:46 PM Indications: diagnostic evaluation and pain Details: 22 G 1.5 in needle, superolateral approach  Arthrogram: No  Medications: 3 mL lidocaine 1 %; 40 mg methylPREDNISolone acetate 40 MG/ML Outcome: tolerated well, no immediate complications Procedure, treatment alternatives, risks and benefits explained, specific risks discussed. Consent was given by the patient. Immediately prior to procedure a time out was called to verify the correct patient, procedure, equipment, support staff and site/side marked as required. Patient was prepped and draped in the usual sterile fashion.       Clinical Data: No additional findings.   Subjective: Chief Complaint  Patient presents with  . Right Knee - Pain  The patient comes in today with a history of right knee pain.  She is well-known to me.  We actually replaced her left knee  remotely.  She is an avid Academic librarian and very athletic.  She is getting ready to go on a trip to [place] for 3 weeks.  The knee has been buckling involving her and she wanted it checked out today in terms of her right knee.  She has not had surgery on that knee but does have known arthritis in that knee.  She has had no acute change in her medical status.  She does wear a knee sleeve when she takes long walks and one when she swims.  HPI  Review of Systems She currently denies any headache, chest pain, shortness of breath, fever, chills, nausea, vomiting  Objective: Vital Signs: There were no vitals taken for this visit.  Physical Exam She is alert and oriented x3 and in no acute distress Ortho Exam Examination of her right knee shows full range of motion of that knee.  Her McMurray's and Lachman's are negative.  There is medial joint line tenderness and slight varus malalignment. Specialty Comments:  No specialty comments available.  Imaging: No results found.   PMFS History: Patient Active Problem List   Diagnosis Date Noted  . Pes anserinus bursitis of right knee 02/16/2020  . Unilateral primary osteoarthritis, left knee 10/18/2018  . Status post total left knee replacement 10/18/2018  . Unilateral primary osteoarthritis, right knee 03/28/2018  . Chronic  pain of right knee 03/28/2018  . Hx of adenomatous colonic polyps 07/17/2017   Past Medical History:  Diagnosis Date  . Anxiety   . Arthritis   . Depression   . Headache    history of  . Heart murmur   . Hx of adenomatous colonic polyps 07/17/2017  . Hyperlipidemia   . Hypertension     Family History  Problem Relation Age of Onset  . Colon cancer Neg Hx   . Esophageal cancer Neg Hx   . Rectal cancer Neg Hx   . Stomach cancer Neg Hx     Past Surgical History:  Procedure Laterality Date  . bladder tack    . COLONOSCOPY  2006   and 2018  . FUNCTIONAL ENDOSCOPIC SINUS SURGERY    . JOINT REPLACEMENT     Left total  knee arthroplasty Dr. Ninfa Linden 10-18-18  . KNEE ARTHROSCOPY W/ MENISCAL REPAIR Right 08/2016  . TOTAL KNEE ARTHROPLASTY Left 10/18/2018   Procedure: LEFT TOTAL KNEE ARTHROPLASTY;  Surgeon: Mcarthur Rossetti, MD;  Location: [facility];  Service: Orthopedics;  Laterality: Left;  . TUBAL LIGATION  1993   Social History   Occupational History  . Not on file  Tobacco Use  . Smoking status: Current Every Day Smoker    Packs/day: 1.00    Types: Cigarettes  . Smokeless tobacco: Never Used  Vaping Use  . Vaping Use: Never used  Substance and Sexual Activity  . Alcohol use: Yes    Alcohol/week: 3.0 - 5.0 standard drinks    Types: 3 - 5 Glasses of wine per week    Comment: 3-5 wine per week per pt  . Drug use: Not Currently    Types: Marijuana    Comment: occasionally  . Sexual activity: Yes

## 2020-09-20 ENCOUNTER — Encounter: Payer: Self-pay | Admitting: Internal Medicine

## 2020-10-12 ENCOUNTER — Other Ambulatory Visit: Payer: Self-pay

## 2020-10-12 ENCOUNTER — Ambulatory Visit (AMBULATORY_SURGERY_CENTER): Payer: Self-pay

## 2020-10-12 VITALS — Ht 68.0 in | Wt 164.0 lb

## 2020-10-12 DIAGNOSIS — Z86010 Personal history of colonic polyps: Secondary | ICD-10-CM

## 2020-10-12 MED ORDER — NA SULFATE-K SULFATE-MG SULF 17.5-3.13-1.6 GM/177ML PO SOLN: 1.0000 | Freq: Once | ORAL | 0 refills | Status: AC

## 2020-10-12 NOTE — Progress Notes (Signed)
No allergies to soy or egg Pt is not on blood thinners or diet pills Denies issues with sedation/intubation Denies atrial flutter/fib Denies constipation   Emmi instructions given to pt  Pt is aware of Covid safety and care partner requirements.  

## 2020-11-08 ENCOUNTER — Telehealth: Payer: Self-pay | Admitting: Internal Medicine

## 2020-11-08 NOTE — Telephone Encounter (Signed)
Good morning Dr. Leone Payor, this patient called stating she is not feeling well and rescheduled her procedure from 11/10/20 to 12/01/20.

## 2020-11-08 NOTE — Telephone Encounter (Signed)
Ok no charge

## 2020-11-10 ENCOUNTER — Encounter: Payer: 59 | Admitting: Internal Medicine

## 2020-11-18 ENCOUNTER — Other Ambulatory Visit (HOSPITAL_COMMUNITY): Payer: Self-pay | Admitting: Internal Medicine

## 2020-11-18 ENCOUNTER — Other Ambulatory Visit: Payer: Self-pay | Admitting: Internal Medicine

## 2020-11-18 DIAGNOSIS — R7989 Other specified abnormal findings of blood chemistry: Secondary | ICD-10-CM

## 2020-11-18 DIAGNOSIS — R1112 Projectile vomiting: Secondary | ICD-10-CM

## 2020-11-18 DIAGNOSIS — Z87898 Personal history of other specified conditions: Secondary | ICD-10-CM

## 2020-11-29 ENCOUNTER — Encounter (HOSPITAL_COMMUNITY): Payer: Self-pay

## 2020-11-29 ENCOUNTER — Ambulatory Visit (HOSPITAL_COMMUNITY): Payer: 59

## 2020-12-01 ENCOUNTER — Other Ambulatory Visit: Payer: Self-pay

## 2020-12-01 ENCOUNTER — Ambulatory Visit (AMBULATORY_SURGERY_CENTER): Payer: 59 | Admitting: Internal Medicine

## 2020-12-01 ENCOUNTER — Encounter: Payer: Self-pay | Admitting: Internal Medicine

## 2020-12-01 VITALS — BP 122/67 | HR 71 | Temp 98.0°F | Resp 11 | Ht 68.0 in | Wt 164.0 lb

## 2020-12-01 DIAGNOSIS — D123 Benign neoplasm of transverse colon: Secondary | ICD-10-CM

## 2020-12-01 DIAGNOSIS — K635 Polyp of colon: Secondary | ICD-10-CM | POA: Diagnosis not present

## 2020-12-01 DIAGNOSIS — Z86010 Personal history of colonic polyps: Secondary | ICD-10-CM | POA: Diagnosis present

## 2020-12-01 MED ORDER — SODIUM CHLORIDE 0.9 % IV SOLN: 500.0000 mL | Freq: Once | INTRAVENOUS | Status: DC

## 2020-12-01 NOTE — Op Note (Signed)
[facility] Patient Name: Kristin Benjamin Procedure Date: 12/01/2020 8:42 AM MRN: [id number] Endoscopist: Gatha Mayer , MD Age: 65 Referring MD:  Date of Birth: Sep 29, 1956 Gender: Female Account #: [id number] Procedure:                Colonoscopy Indications:              Surveillance: Personal history of adenomatous                            polyps on last colonoscopy 3 years ago Medicines:                Propofol per Anesthesia, Monitored Anesthesia Care Procedure:                Pre-Anesthesia Assessment:                           - Prior to the procedure, a History and Physical                            was performed, and patient medications and                            allergies were reviewed. The patient's tolerance of                            previous anesthesia was also reviewed. The risks                            and benefits of the procedure and the sedation                            options and risks were discussed with the patient.                            All questions were answered, and informed consent                            was obtained. Prior Anticoagulants: The patient has                            taken no previous anticoagulant or antiplatelet                            agents. ASA Grade Assessment: II - A patient with                            mild systemic disease. After reviewing the risks                            and benefits, the patient was deemed in                            satisfactory condition to undergo the procedure.  After obtaining informed consent, the colonoscope                            was passed under direct vision. Throughout the                            procedure, the patient's blood pressure, pulse, and                            oxygen saturations were monitored continuously. The                            Olympus CF-HQ190 [id number]) Colonoscope was                             introduced through the anus and advanced to the the                            cecum, identified by appendiceal orifice and                            ileocecal valve. The colonoscopy was somewhat                            difficult due to a tortuous colon. Successful                            completion of the procedure was aided by                            straightening and shortening the scope to obtain                            bowel loop reduction. The patient tolerated the                            procedure well. The quality of the bowel                            preparation was good. The ileocecal valve,                            appendiceal orifice, and rectum were photographed. Scope In: 8:54:28 AM Scope Out: 9:20:31 AM Scope Withdrawal Time: 0 hours 14 minutes 19 seconds  Total Procedure Duration: 0 hours 26 minutes 3 seconds  Findings:                 The perianal and digital rectal examinations were                            normal.                           A diminutive polyp was found in the distal  transverse colon. The polyp was flat. The polyp was                            removed with a cold snare. Resection and retrieval                            were complete. Verification of patient                            identification for the specimen was done. Estimated                            blood loss was minimal.                           Many small and large-mouthed diverticula were found                            in the sigmoid colon and descending colon.                           The exam was otherwise without abnormality on                            direct and retroflexion views. Complications:            No immediate complications. Estimated Blood Loss:     Estimated blood loss was minimal. Impression:               - One diminutive polyp in the distal transverse                            colon, removed with a cold snare.  Resected and                            retrieved.                           - Severe diverticulosis in the sigmoid colon and in                            the descending colon.                           - The examination was otherwise normal on direct                            and retroflexion views.                           - Personal history of colonic polyps. 3 diminutive                            adenomas 2018 Recommendation:           - Patient has a contact number available for  emergencies. The signs and symptoms of potential                            delayed complications were discussed with the                            patient. Return to normal activities tomorrow.                            Written discharge instructions were provided to the                            patient.                           - Resume previous diet.                           - Continue present medications.                           - Repeat colonoscopy is recommended for                            surveillance. The colonoscopy date will be                            determined after pathology results from today's                            exam become available for review. Gatha Mayer, MD 12/01/2020 9:30:19 AM This report has been signed electronically.

## 2020-12-01 NOTE — Progress Notes (Signed)
A/ox3, pleased with MAC, report to RN 

## 2020-12-01 NOTE — Progress Notes (Signed)
Called to room to assist during endoscopic procedure.  Patient ID and intended procedure confirmed with present staff. Received instructions for my participation in the procedure from the performing physician.  

## 2020-12-01 NOTE — Progress Notes (Signed)
Vitals by CW 

## 2020-12-01 NOTE — Patient Instructions (Addendum)
I found and removed 1 tiny polyp.  You still have diverticulosis, that never goes away.  You also have a condition called diverticulosis - common and not usually a problem. Please read the handout provided.  I will let you know pathology results and when to have another routine colonoscopy by mail and/or My Chart.  I appreciate the opportunity to care for you. Gatha Mayer, MD, [facility]   Handouts given on polyps and diverticulosis.  YOU HAD AN ENDOSCOPIC PROCEDURE TODAY: Refer to the procedure report and other information in the discharge instructions given to you for any specific questions about what was found during the examination. If this information does not answer your questions, please call [place] office at [phone] to clarify.   YOU SHOULD EXPECT: Some feelings of bloating in the abdomen. Passage of more gas than usual. Walking can help get rid of the air that was put into your GI tract during the procedure and reduce the bloating. If you had a lower endoscopy (such as a colonoscopy or flexible sigmoidoscopy) you may notice spotting of blood in your stool or on the toilet paper. Some abdominal soreness may be present for a day or two, also.  DIET: Your first meal following the procedure should be a light meal and then it is ok to progress to your normal diet. A half-sandwich or bowl of soup is an example of a good first meal. Heavy or fried foods are harder to digest and may make you feel nauseous or bloated. Drink plenty of fluids but you should avoid alcoholic beverages for 24 hours. If you had a esophageal dilation, please see attached instructions for diet.    ACTIVITY: Your care partner should take you home directly after the procedure. You should plan to take it easy, moving slowly for the rest of the day. You can resume normal activity the day after the procedure however YOU SHOULD NOT DRIVE, use power tools, machinery or perform tasks that involve climbing or major physical  exertion for 24 hours (because of the sedation medicines used during the test).   SYMPTOMS TO REPORT IMMEDIATELY: A gastroenterologist can be reached at any hour. Please call [phone]  for any of the following symptoms:  . Following lower endoscopy (colonoscopy, flexible sigmoidoscopy) Excessive amounts of blood in the stool  Significant tenderness, worsening of abdominal pains  Swelling of the abdomen that is new, acute  Fever of 100 or higher   FOLLOW UP:  If any biopsies were taken you will be contacted by phone or by letter within the next 1-3 weeks. Call [phone]  if you have not heard about the biopsies in 3 weeks.  Please also call with any specific questions about appointments or follow up tests.

## 2020-12-03 ENCOUNTER — Telehealth: Payer: Self-pay | Admitting: *Deleted

## 2020-12-03 NOTE — Telephone Encounter (Signed)
  Follow up Call-  Call back number 12/01/2020  Post procedure Call Back phone  # [phone]  Permission to leave phone message Yes  Some recent data might be hidden     Patient questions:  Message left to call us if necessary.

## 2020-12-09 ENCOUNTER — Encounter: Payer: Self-pay | Admitting: Internal Medicine

## 2021-01-04 ENCOUNTER — Telehealth: Payer: Self-pay

## 2021-01-04 ENCOUNTER — Ambulatory Visit (INDEPENDENT_AMBULATORY_CARE_PROVIDER_SITE_OTHER): Payer: 59 | Admitting: Orthopaedic Surgery

## 2021-01-04 DIAGNOSIS — M25561 Pain in right knee: Secondary | ICD-10-CM

## 2021-01-04 DIAGNOSIS — G8929 Other chronic pain: Secondary | ICD-10-CM

## 2021-01-04 DIAGNOSIS — M1711 Unilateral primary osteoarthritis, right knee: Secondary | ICD-10-CM | POA: Diagnosis not present

## 2021-01-04 MED ORDER — LIDOCAINE HCL 1 % IJ SOLN
3.0000 mL | INTRAMUSCULAR | Status: AC | PRN
  Administered 2021-01-04: 3 mL

## 2021-01-04 MED ORDER — METHYLPREDNISOLONE ACETATE 40 MG/ML IJ SUSP
40.0000 mg | INTRAMUSCULAR | Status: AC | PRN
  Administered 2021-01-04: 40 mg via INTRA_ARTICULAR

## 2021-01-04 NOTE — Progress Notes (Signed)
Office Visit Note   Patient: Kristin Benjamin           Date of Birth: 1956/10/20           MRN: [id number] Visit Date: 01/04/2021              Requested by: Josetta Huddle, MD [address] [place],  [place] [address] PCP: Josetta Huddle, MD   Assessment & Plan: Visit Diagnoses:  1. Chronic pain of right knee   2. Unilateral primary osteoarthritis, right knee     Plan: I did place a steroid injection per her request in the right knee today to treat her acute pain.  She is a perfect candidate for hyaluronic acid for her knee.  She had that in the past a year or 2 ago and this helped greatly.  She is someone who we would order this again for.  We will work on ordering hyaluronic acid to treat the osteoarthritis pain in her right knee and contact her when that is approved and available to inject.  Follow-Up Instructions: No follow-ups on file.   Orders:  No orders of the defined types were placed in this encounter.  No orders of the defined types were placed in this encounter.     Procedures: Large Joint Inj: R knee on 01/04/2021 8:42 AM Indications: diagnostic evaluation and pain Details: 22 G 1.5 in needle, superolateral approach  Arthrogram: No  Medications: 3 mL lidocaine 1 %; 40 mg methylPREDNISolone acetate 40 MG/ML Outcome: tolerated well, no immediate complications Procedure, treatment alternatives, risks and benefits explained, specific risks discussed. Consent was given by the patient. Immediately prior to procedure a time out was called to verify the correct patient, procedure, equipment, support staff and site/side marked as required. Patient was prepped and draped in the usual sterile fashion.       Clinical Data: No additional findings.   Subjective: Chief Complaint  Patient presents with  . Right Knee - Pain, Follow-up  The patient is well-known to me.  She has chronic right knee pain with known osteoarthritis.  She is a very active  individual and exercises regularly.  She walks a lot and swims.  She is only 65 years old.  In 2019 replaced her left knee.  That knee has done well.  She has not had a steroid injection in a long time in her right knee and it has been a long time since she has had hyaluronic acid for that knee.  She has had no acute change in medical status either.  She denies any locking catching.  Is mainly pain on the medial joint line of the right knee.  HPI  Review of Systems She currently denies any headache, chest pain, shortness of breath, fever, chills, nausea, vomiting  Objective: Vital Signs: There were no vitals taken for this visit.  Physical Exam She is alert and orient x3 and in no acute distress Ortho Exam Examination of the right knee shows no effusion.  There is medial joint line tenderness and slight varus malalignment with good range of motion.  Her left operative knee moves well and is stable ligamentously. Specialty Comments:  No specialty comments available.  Imaging: No results found.   PMFS History: Patient Active Problem List   Diagnosis Date Noted  . Pes anserinus bursitis of right knee 02/16/2020  . Unilateral primary osteoarthritis, left knee 10/18/2018  . Status post total left knee replacement 10/18/2018  . Unilateral primary osteoarthritis, right  knee 03/28/2018  . Chronic pain of right knee 03/28/2018  . Hx of adenomatous colonic polyps 07/17/2017   Past Medical History:  Diagnosis Date  . Anxiety   . Arthritis   . Depression   . Headache    history of  . Heart murmur    in the past  . Hx of adenomatous colonic polyps 07/17/2017  . Hyperlipidemia   . Hypertension     Family History  Problem Relation Age of Onset  . Colon cancer Neg Hx   . Esophageal cancer Neg Hx   . Rectal cancer Neg Hx   . Stomach cancer Neg Hx   . Colon polyps Neg Hx     Past Surgical History:  Procedure Laterality Date  . bladder tack    . COLONOSCOPY  2006   and 2018  .  FUNCTIONAL ENDOSCOPIC SINUS SURGERY    . JOINT REPLACEMENT     Left total knee arthroplasty Dr. Ninfa Linden 10-18-18  . KNEE ARTHROSCOPY W/ MENISCAL REPAIR Right 08/2016  . TOTAL KNEE ARTHROPLASTY Left 10/18/2018   Procedure: LEFT TOTAL KNEE ARTHROPLASTY;  Surgeon: Mcarthur Rossetti, MD;  Location: [facility];  Service: Orthopedics;  Laterality: Left;  . TUBAL LIGATION  1993   Social History   Occupational History  . Occupation: retired  Tobacco Use  . Smoking status: Current Every Day Smoker    Packs/day: 1.00    Types: Cigarettes  . Smokeless tobacco: Never Used  Vaping Use  . Vaping Use: Never used  Substance and Sexual Activity  . Alcohol use: Yes    Alcohol/week: 3.0 - 5.0 standard drinks    Types: 3 - 5 Glasses of wine per week    Comment: 3-5 wine per week per pt  . Drug use: Not Currently    Types: Marijuana    Comment: occasionally--monthly  . Sexual activity: Yes

## 2021-01-04 NOTE — Telephone Encounter (Signed)
Right knee gel injection  

## 2021-01-05 NOTE — Telephone Encounter (Signed)
Noted  

## 2021-01-13 ENCOUNTER — Telehealth: Payer: Self-pay

## 2021-01-13 NOTE — Telephone Encounter (Signed)
Submitted VOB for SynviscOne, right knee. Pending BV.

## 2021-02-08 ENCOUNTER — Telehealth: Payer: Self-pay

## 2021-02-08 NOTE — Telephone Encounter (Signed)
Called and left a VM advising patient that [facility] does not cover for gel injections and that we can try another gel injection by the name of TriVisc, which is a series of 3.

## 2021-02-10 ENCOUNTER — Telehealth: Payer: Self-pay

## 2021-02-10 NOTE — Telephone Encounter (Signed)
Patient's PA is denied for SynviscOne, right knee through [facility].   Would it be okay to try TriVisc, which is a series of 3 for patient?   Patient is aware of the above information.  Please advise.  Thank you.

## 2021-02-10 NOTE — Telephone Encounter (Signed)
I am fine with placing the Trivisc injections in her knee if she is fine with that.

## 2021-02-11 ENCOUNTER — Telehealth: Payer: Self-pay

## 2021-02-11 NOTE — Telephone Encounter (Signed)
Submitted for TriVisc, right knee.  Patient is aware that she will receive a call from [facility].

## 2021-02-11 NOTE — Telephone Encounter (Signed)
Noted  

## 2021-02-23 ENCOUNTER — Encounter: Payer: Self-pay | Admitting: Orthopaedic Surgery

## 2021-02-23 ENCOUNTER — Ambulatory Visit (INDEPENDENT_AMBULATORY_CARE_PROVIDER_SITE_OTHER): Payer: 59 | Admitting: Orthopaedic Surgery

## 2021-02-23 DIAGNOSIS — M1711 Unilateral primary osteoarthritis, right knee: Secondary | ICD-10-CM

## 2021-02-23 MED ORDER — SODIUM HYALURONATE (VISCOSUP) 25 MG/2.5ML IX SOSY
25.0000 mg | PREFILLED_SYRINGE | INTRA_ARTICULAR | Status: AC | PRN
  Administered 2021-02-23: 25 mg via INTRA_ARTICULAR

## 2021-02-23 NOTE — Progress Notes (Signed)
   Procedure Note  Patient: Kristin Benjamin             Date of Birth: February 24, 1956           MRN: [id number]             Visit Date: 02/23/2021  Procedures: Visit Diagnoses:  1. Unilateral primary osteoarthritis, right knee     Large Joint Inj: R knee on 02/23/2021 1:28 PM Indications: diagnostic evaluation and pain Details: 22 G 1.5 in needle, superolateral approach  Arthrogram: No  Medications: 25 mg Sodium Hyaluronate 25 MG/2.5ML Outcome: tolerated well, no immediate complications Procedure, treatment alternatives, risks and benefits explained, specific risks discussed. Consent was given by the patient. Immediately prior to procedure a time out was called to verify the correct patient, procedure, equipment, support staff and site/side marked as required. Patient was prepped and draped in the usual sterile fashion.    The patient is here today for hyaluronic acid injection in her right knee to treat the pain from osteoarthritis.  She has tried and failed other forms conservative treatment including even steroid injections.  She is also tried activity modification as well as quad strengthening exercises and oral anti-inflammatories.  She is here today for injection #1 of a series of 3 injections of TriVisc.  She understands why she is here for these injections and and what we hope she will get out of them in terms of decreasing the pain from osteoarthritis in that knee.  She has had no acute change in her medical status.  She did tolerate the injection well in her right knee today.  I will see her back next week for injection #2 of the series of 3 injections.

## 2021-03-01 ENCOUNTER — Ambulatory Visit (INDEPENDENT_AMBULATORY_CARE_PROVIDER_SITE_OTHER): Payer: 59 | Admitting: Orthopaedic Surgery

## 2021-03-01 ENCOUNTER — Encounter: Payer: Self-pay | Admitting: Orthopaedic Surgery

## 2021-03-01 DIAGNOSIS — M1711 Unilateral primary osteoarthritis, right knee: Secondary | ICD-10-CM | POA: Diagnosis not present

## 2021-03-01 MED ORDER — SODIUM HYALURONATE (VISCOSUP) 25 MG/2.5ML IX SOSY
25.0000 mg | PREFILLED_SYRINGE | INTRA_ARTICULAR | Status: AC | PRN
Start: 2021-03-01 — End: 2021-03-01
  Administered 2021-03-01: 25 mg via INTRA_ARTICULAR

## 2021-03-01 NOTE — Progress Notes (Signed)
   Procedure Note  Patient: Kristin Benjamin             Date of Birth: 03-26-1956           MRN: [id number]             Visit Date: 03/01/2021  Procedures: Visit Diagnoses:  1. Unilateral primary osteoarthritis, right knee     Large Joint Inj: R knee on 03/01/2021 10:44 AM Indications: diagnostic evaluation and pain Details: 22 G 1.5 in needle, superolateral approach  Arthrogram: No  Medications: 25 mg Sodium Hyaluronate 25 MG/2.5ML Outcome: tolerated well, no immediate complications Procedure, treatment alternatives, risks and benefits explained, specific risks discussed. Consent was given by the patient. Immediately prior to procedure a time out was called to verify the correct patient, procedure, equipment, support staff and site/side marked as required. Patient was prepped and draped in the usual sterile fashion.     The patient comes in today for injection #2 of a series of 3 hyaluronic acid injections with Trivisc.  She had a little bit of problem after the first injection in terms of just some of the pain and got lightheaded.  She feels much better overall and has no adverse reaction to injection #1.  With this injection we want to lay her down and place lidocaine first.  We were able to do that and then placed a hyaluronic acid in her knee which she tolerated well.  All questions and concerns were answered and addressed.  We will see her back next week for injection #3.  We will anesthetize her knee with lidocaine prior to the injection #3.

## 2021-03-08 ENCOUNTER — Encounter: Payer: Self-pay | Admitting: Orthopaedic Surgery

## 2021-03-08 ENCOUNTER — Ambulatory Visit (INDEPENDENT_AMBULATORY_CARE_PROVIDER_SITE_OTHER): Payer: 59 | Admitting: Orthopaedic Surgery

## 2021-03-08 DIAGNOSIS — M1711 Unilateral primary osteoarthritis, right knee: Secondary | ICD-10-CM | POA: Diagnosis not present

## 2021-03-08 MED ORDER — SODIUM HYALURONATE (VISCOSUP) 25 MG/2.5ML IX SOSY
25.0000 mg | PREFILLED_SYRINGE | INTRA_ARTICULAR | Status: AC | PRN
Start: 2021-03-08 — End: 2021-03-08
  Administered 2021-03-08: 25 mg via INTRA_ARTICULAR

## 2021-03-08 NOTE — Progress Notes (Signed)
   Procedure Note  Patient: Kristin Benjamin             Date of Birth: [birth year]           MRN: [id number]             Visit Date: 03/08/2021  Procedures: Visit Diagnoses:  1. Unilateral primary osteoarthritis, right knee     Large Joint Inj on 03/08/2021 8:30 AM Indications: diagnostic evaluation and pain Details: 22 G 1.5 in needle, superolateral approach  Arthrogram: No  Medications: 25 mg Sodium Hyaluronate 25 MG/2.5ML Outcome: tolerated well, no immediate complications Procedure, treatment alternatives, risks and benefits explained, specific risks discussed. Consent was given by the patient. Immediately prior to procedure a time out was called to verify the correct patient, procedure, equipment, support staff and site/side marked as required. Patient was prepped and draped in the usual sterile fashion.    The patient is here today for injection #3 of a series of 3 hyaluronic acid injections in her right knee with Tri Visc.  This is to treat the pain from osteoarthritis.  She has a history of a left total knee arthroplasty.  Steroid injections have failed to help.  She has had no adverse reaction to the first 2 injections.  I did place injection #3 in her right knee without difficulty.  All question concerns were answered and addressed.  At this point follow-up will be as needed.

## 2021-03-18 ENCOUNTER — Other Ambulatory Visit: Payer: Self-pay

## 2021-03-18 ENCOUNTER — Emergency Department
Admission: RE | Admit: 2021-03-18 | Discharge: 2021-03-18 | Disposition: A | Discharge status: Home / Self Care | Payer: 59 | Source: Ambulatory Visit | Attending: Family Medicine | Admitting: Family Medicine

## 2021-03-18 VITALS — BP 122/79 | HR 82 | Temp 99.2°F

## 2021-03-18 DIAGNOSIS — J111 Influenza due to unidentified influenza virus with other respiratory manifestations: Secondary | ICD-10-CM

## 2021-03-18 MED ORDER — OSELTAMIVIR PHOSPHATE 75 MG PO CAPS: 75.0000 mg | ORAL_CAPSULE | Freq: Two times a day (BID) | ORAL | 0 refills | Status: AC

## 2021-03-18 MED ORDER — DOXYCYCLINE HYCLATE 100 MG PO CAPS: ORAL_CAPSULE | ORAL | 0 refills | Status: DC

## 2021-03-18 NOTE — ED Triage Notes (Signed)
Patient c/o productive cough, afebrile, runny nose, congestion x 5 days.  Patient has taken Alka Seltzer Plus, Nyquil.  Patient is vaccinated.

## 2021-03-18 NOTE — Discharge Instructions (Signed)
Take plain guaifenesin (1200mg  extended release tabs such as Mucinex) twice daily, with plenty of water, for cough and congestion. Get adequate rest.   May use Afrin nasal spray (or generic oxymetazoline) each morning for about 5 days and then discontinue.  Also recommend using saline nasal spray several times daily and saline nasal irrigation (AYR is a common brand).  Use Flonase nasal spray each morning after using Afrin nasal spray and saline nasal irrigation. Try warm salt water gargles for sore throat.  Stop all antihistamines (Nyquil, etc) for now, and other non-prescription cough/cold preparations. May take Tylenol as needed for fever, body aches, etc. May take Delsym Cough Suppressant ("12 Hour Cough Relief") at bedtime for nighttime cough.

## 2021-03-18 NOTE — ED Provider Notes (Signed)
[name]    CSN: [id number] Arrival date & time: 03/18/21  1043      History   Chief Complaint Chief Complaint  Patient presents with  . Appointment    Sore throat / productive cough    HPI Kristin Benjamin is a 65 y.o. female.   Five days ago patient developed a sore throat and nasal congestion, followed the next day by a productive cough.  She has been fatigued, with myalgias, bilateral ear pressure, and onset of fever today.  She denies pleuritic pain or shortness of breath.  She continues to smoke.  The history is provided by the patient.    Past Medical History:  Diagnosis Date  . Anxiety   . Arthritis   . Depression   . Headache    history of  . Heart murmur    in the past  . Hx of adenomatous colonic polyps 07/17/2017  . Hyperlipidemia   . Hypertension     Patient Active Problem List   Diagnosis Date Noted  . Pes anserinus bursitis of right knee 02/16/2020  . Unilateral primary osteoarthritis, left knee 10/18/2018  . Status post total left knee replacement 10/18/2018  . Unilateral primary osteoarthritis, right knee 03/28/2018  . Chronic pain of right knee 03/28/2018  . Hx of adenomatous colonic polyps 07/17/2017    Past Surgical History:  Procedure Laterality Date  . bladder tack    . COLONOSCOPY  2006   and 2018  . FUNCTIONAL ENDOSCOPIC SINUS SURGERY    . JOINT REPLACEMENT     Left total knee arthroplasty Dr. Ninfa Linden 10-18-18  . KNEE ARTHROSCOPY W/ MENISCAL REPAIR Right 08/2016  . TOTAL KNEE ARTHROPLASTY Left 10/18/2018   Procedure: LEFT TOTAL KNEE ARTHROPLASTY;  Surgeon: Mcarthur Rossetti, MD;  Location: [facility];  Service: Orthopedics;  Laterality: Left;  . TUBAL LIGATION  1993    OB History   No obstetric history on file.      Home Medications    Prior to Admission medications   Medication Sig Start Date End Date Taking? Authorizing Provider  amphetamine-dextroamphetamine (ADDERALL) 5 MG tablet Take 5 mg by mouth  daily.  07/24/16  Yes [provider]  atorvastatin (LIPITOR) 40 MG tablet Take 20 mg by mouth daily. 12/11/16  Yes [provider]  conjugated estrogens (PREMARIN) vaginal cream Place 1 Applicatorful vaginally 3 (three) times a week.  12/22/16  Yes [provider]  doxycycline (VIBRAMYCIN) 100 MG capsule Take one cap PO Q12hr with food. 03/18/21  Yes Kandra Nicolas, MD  oseltamivir (TAMIFLU) 75 MG capsule Take 1 capsule (75 mg total) by mouth 2 (two) times daily for 5 days. 03/18/21 03/23/21 Yes Kandra Nicolas, MD  sertraline (ZOLOFT) 50 MG tablet Take 50 mg by mouth daily. 12/11/16  Yes [provider]  SUMAtriptan (IMITREX) 100 MG tablet sumatriptan 100 mg tablet   Yes [provider]  trimethoprim (TRIMPEX) 100 MG tablet Take by mouth. 06/29/20  Yes [provider]  valsartan-hydrochlorothiazide (DIOVAN-HCT) 160-12.5 MG tablet valsartan 160 mg-hydrochlorothiazide 12.5 mg tablet   Yes [provider]  aspirin EC 325 MG EC tablet Take 1 tablet (325 mg total) by mouth 2 (two) times daily after a meal. 10/19/18   Mcarthur Rossetti, MD  diclofenac sodium (VOLTAREN) 1 % GEL Apply 1 application topically daily as needed (arthritis).  09/06/16   [provider]  fluticasone (FLONASE) 50 MCG/ACT nasal spray Place 2 sprays into both nostrils daily as  needed for allergies.  07/06/16   [provider]  ibuprofen (ADVIL,MOTRIN) 800 MG tablet Take 1 tablet (800 mg total) by mouth every 8 (eight) hours as needed for moderate pain. 06/03/18   Long, W[facility] [employer], MD    Family History Family History  Problem Relation Age of Onset  . Colon cancer Neg Hx   . Esophageal cancer Neg Hx   . Rectal cancer Neg Hx   . Stomach cancer Neg Hx   . Colon polyps Neg Hx     Social History Social History   Tobacco Use  . Smoking status: Current Every Day Smoker    Packs/day: 1.00    Types: Cigarettes  . Smokeless tobacco: Never Used  Vaping  Use  . Vaping Use: Never used  Substance Use Topics  . Alcohol use: Yes    Alcohol/week: 3.0 - 5.0 standard drinks    Types: 3 - 5 Glasses of wine per week    Comment: 3-5 wine per week per pt  . Drug use: Not Currently    Types: Marijuana    Comment: occasionally--monthly     Allergies   Patient has no known allergies.   Review of Systems Review of Systems + sore throat + cough No pleuritic pain No wheezing + nasal congestion + post-nasal drainage No sinus pain/pressure No itchy/red eyes ? earache No hemoptysis No SOB + fever No nausea No vomiting No abdominal pain No diarrhea No urinary symptoms No skin rash + fatigue + myalgias No headache Used OTC meds (Alka Seltzer, Nyquil, Aleve) without relief   Physical Exam Triage Vital Signs ED Triage Vitals  Enc Vitals Group     BP 03/18/21 1203 122/79     Pulse Rate 03/18/21 1203 82     Resp --      Temp 03/18/21 1203 99.2 F (37.3 C)     Temp Source 03/18/21 1203 Oral     SpO2 03/18/21 1203 93 %     Weight --      Height --      Head Circumference --      Peak Flow --      Pain Score 03/18/21 1205 0     Pain Loc --      Pain Edu? --      Excl. in [place]? --    No data found.  Updated Vital Signs BP 122/79 (BP Location: Left Arm)   Pulse 82   Temp 99.2 F (37.3 C) (Oral)   SpO2 93%   Visual Acuity Right Eye Distance:   Left Eye Distance:   Bilateral Distance:    Right Eye Near:   Left Eye Near:    Bilateral Near:     Physical Exam Nursing notes and Vital Signs reviewed. Appearance:  Patient appears stated age, and in no acute distress Eyes:  Pupils are equal, round, and reactive to light and accomodation.  Extraocular movement is intact.  Conjunctivae are not inflamed  Ears:  Canals normal.  Tympanic membranes normal.  Nose:  Mildly congested turbinates.  No sinus tenderness.   Pharynx:  Normal Neck:  Supple.  Mildly enlarged lateral nodes are present, tender to palpation on the left.    Lungs:  Clear to auscultation.  Breath sounds are equal.  Moving air well. Heart:  Regular rate and rhythm without murmurs, rubs, or gallops.  Abdomen:  Nontender without masses or hepatosplenomegaly.  Bowel sounds are present.  No CVA or flank tenderness.  Extremities:  No edema.  Skin:  No rash present.   UC Treatments / Results  Labs (all labs ordered are listed, but only abnormal results are displayed) Labs Reviewed  COVID-19, FLU A+B NAA    EKG   Radiology No results found.  Procedures Procedures (including critical care time)  Medications Ordered in UC Medications - No data to display  Initial Impression / Assessment and Plan / UC Course  I have reviewed the triage vital signs and the nursing notes.  Pertinent labs & imaging results that were available during my care of the patient were reviewed by me and considered in my medical decision making (see chart for details).    Note onset fever today. ?developing bronchitis, sinusitis. Patient continues to smoke.  Begin empiric doxycycline. ?developing flu; will check COVID19 PCR and Flu A/B.  Begin empiric Tamiflu. Followup with Family Doctor if not improved in one week.    Final Clinical Impressions(s) / UC Diagnoses   Final diagnoses:  Influenza-like illness     Discharge Instructions     Take plain guaifenesin (1200mg  extended release tabs such as Mucinex) twice daily, with plenty of water, for cough and congestion. Get adequate rest.   May use Afrin nasal spray (or generic oxymetazoline) each morning for about 5 days and then discontinue.  Also recommend using saline nasal spray several times daily and saline nasal irrigation (AYR is a common brand).  Use Flonase nasal spray each morning after using Afrin nasal spray and saline nasal irrigation. Try warm salt water gargles for sore throat.  Stop all antihistamines (Nyquil, etc) for now, and other non-prescription cough/cold preparations. May take Tylenol as  needed for fever, body aches, etc. May take Delsym Cough Suppressant ("12 Hour Cough Relief") at bedtime for nighttime cough.      ED Prescriptions    Medication Sig Dispense Auth. Provider   oseltamivir (TAMIFLU) 75 MG capsule Take 1 capsule (75 mg total) by mouth 2 (two) times daily for 5 days. 10 capsule Kandra Nicolas, MD   doxycycline (VIBRAMYCIN) 100 MG capsule Take one cap PO Q12hr with food. 14 capsule Kandra Nicolas, MD        Kandra Nicolas, MD 03/19/21 [phone]

## 2021-03-21 LAB — COVID-19, FLU A+B NAA
Influenza A, NAA: NOT DETECTED
Influenza B, NAA: NOT DETECTED
SARS-CoV-2, NAA: NOT DETECTED

## 2021-05-17 LAB — HM MAMMOGRAPHY

## 2021-05-18 LAB — HM PAP SMEAR: HM Pap smear: NORMAL

## 2021-05-18 LAB — RESULTS CONSOLE HPV: CHL HPV: NEGATIVE

## 2021-08-10 ENCOUNTER — Ambulatory Visit: Payer: 59 | Attending: Internal Medicine

## 2021-08-10 DIAGNOSIS — Z23 Encounter for immunization: Secondary | ICD-10-CM

## 2021-08-10 NOTE — Progress Notes (Signed)
   Covid-19 Vaccination Clinic  Name:  ONYX EDGLEY    MRN: [id number] DOB: May 30, 1956  08/10/2021  Ms. Sliwinski was observed post Covid-19 immunization for 15 minutes without incident. She was provided with Vaccine Information Sheet and instruction to access the V-Safe system.   Ms. Belford was instructed to call 911 with any severe reactions post vaccine: Difficulty breathing  Swelling of face and throat  A fast heartbeat  A bad rash all over body  Dizziness and weakness

## 2021-08-19 ENCOUNTER — Other Ambulatory Visit (HOSPITAL_BASED_OUTPATIENT_CLINIC_OR_DEPARTMENT_OTHER): Payer: Self-pay

## 2021-08-19 MED ORDER — COVID-19MRNA BIVAL VACC PFIZER 30 MCG/0.3ML IM SUSP
INTRAMUSCULAR | 0 refills | Status: DC
Start: 2021-08-10 — End: 2022-02-16
  Filled 2021-08-19: qty 0.3, 1d supply, fill #0

## 2021-08-29 ENCOUNTER — Ambulatory Visit (INDEPENDENT_AMBULATORY_CARE_PROVIDER_SITE_OTHER): Payer: 59 | Admitting: Orthopaedic Surgery

## 2021-08-29 ENCOUNTER — Encounter: Payer: Self-pay | Admitting: Orthopaedic Surgery

## 2021-08-29 DIAGNOSIS — M1711 Unilateral primary osteoarthritis, right knee: Secondary | ICD-10-CM

## 2021-08-29 DIAGNOSIS — M25561 Pain in right knee: Secondary | ICD-10-CM

## 2021-08-29 DIAGNOSIS — G8929 Other chronic pain: Secondary | ICD-10-CM

## 2021-08-29 NOTE — Progress Notes (Signed)
Office Visit Note   Patient: Kristin Benjamin           Date of Birth: [birth year]           MRN: [id number] Visit Date: 08/29/2021              Requested by: Josetta Huddle, MD [address] [place],  [place] [address] PCP: Josetta Huddle, MD   Assessment & Plan: Visit Diagnoses:  1. Chronic pain of right knee   2. Unilateral primary osteoarthritis, right knee     Plan: Per the patient's request I did place a steroid injection in her right knee without difficulty.  All questions and concerns were answered and addressed.  The risk and benefits of steroid injections were described.  Follow-up is as needed.  She knows to wait at least 3 to 4 months between steroid injections.  Follow-Up Instructions: Return if symptoms worsen or fail to improve.   Orders:  No orders of the defined types were placed in this encounter.  No orders of the defined types were placed in this encounter.     Procedures: No procedures performed   Clinical Data: No additional findings.   Subjective: Chief Complaint  Patient presents with   Right Knee - Follow-up  [name] is well-known to me.  She is an avid Herbalist.  We did replace her left knee in 2020 due to severe arthritis.  She is only 65 years old and a thin individual and athletic.  She has known osteoarthritis of her right knee.  She is requesting a steroid injection today in that knee.  She had hyaluronic acid in the knee back in April of this year and is not sure that really helped her much.  She has had no other acute change in her medical status.  There is been no symptoms of instability of her right knee and she does wear knee brace during activities.  She says it does not hurt when she plays pickle ball but hurts more with walking.  HPI  Review of Systems There is currently listed no headache, chest pain, shortness of breath, fever, chills, nausea, vomiting  Objective: Vital Signs: There were no vitals taken for  this visit.  Physical Exam She is alert and orient x3 and in no acute distress Ortho Exam Examination of her right knee shows slight varus malalignment but no effusion with good range of motion.  There is medial and patellofemoral arthritic changes. Specialty Comments:  No specialty comments available.  Imaging: No results found.   PMFS History: Patient Active Problem List   Diagnosis Date Noted   Pes anserinus bursitis of right knee 02/16/2020   Unilateral primary osteoarthritis, left knee 10/18/2018   Status post total left knee replacement 10/18/2018   Unilateral primary osteoarthritis, right knee 03/28/2018   Chronic pain of right knee 03/28/2018   Hx of adenomatous colonic polyps 07/17/2017   Past Medical History:  Diagnosis Date   Anxiety    Arthritis    Depression    Headache    history of   Heart murmur    in the past   Hx of adenomatous colonic polyps 07/17/2017   Hyperlipidemia    Hypertension     Family History  Problem Relation Age of Onset   Colon cancer Neg Hx    Esophageal cancer Neg Hx    Rectal cancer Neg Hx    Stomach cancer Neg Hx    Colon polyps Neg Hx  Past Surgical History:  Procedure Laterality Date   bladder tack     COLONOSCOPY  2006   and 2018   FUNCTIONAL ENDOSCOPIC SINUS SURGERY     JOINT REPLACEMENT     Left total knee arthroplasty Dr. Ninfa Linden 10-18-18   KNEE ARTHROSCOPY W/ MENISCAL REPAIR Right 08/2016   TOTAL KNEE ARTHROPLASTY Left 10/18/2018   Procedure: LEFT TOTAL KNEE ARTHROPLASTY;  Surgeon: Mcarthur Rossetti, MD;  Location: [facility];  Service: Orthopedics;  Laterality: Left;   TUBAL LIGATION  1993   Social History   Occupational History   Occupation: retired  Tobacco Use   Smoking status: Every Day    Packs/day: 1.00    Types: Cigarettes   Smokeless tobacco: Never  Vaping Use   Vaping Use: Never used  Substance and Sexual Activity   Alcohol use: Yes    Alcohol/week: 3.0 - 5.0 standard drinks    Types: 3 - 5  Glasses of wine per week    Comment: 3-5 wine per week per pt   Drug use: Not Currently    Types: Marijuana    Comment: occasionally--monthly   Sexual activity: Yes

## 2022-02-16 ENCOUNTER — Ambulatory Visit (INDEPENDENT_AMBULATORY_CARE_PROVIDER_SITE_OTHER): Payer: 59 | Admitting: Family Medicine

## 2022-02-16 ENCOUNTER — Encounter: Payer: Self-pay | Admitting: Family Medicine

## 2022-02-16 VITALS — BP 108/94 | HR 95 | Ht 68.0 in | Wt 170.2 lb

## 2022-02-16 DIAGNOSIS — F909 Attention-deficit hyperactivity disorder, unspecified type: Secondary | ICD-10-CM | POA: Diagnosis not present

## 2022-02-16 DIAGNOSIS — N390 Urinary tract infection, site not specified: Secondary | ICD-10-CM

## 2022-02-16 DIAGNOSIS — G43909 Migraine, unspecified, not intractable, without status migrainosus: Secondary | ICD-10-CM

## 2022-02-16 DIAGNOSIS — Z87891 Personal history of nicotine dependence: Secondary | ICD-10-CM

## 2022-02-16 DIAGNOSIS — G4700 Insomnia, unspecified: Secondary | ICD-10-CM | POA: Diagnosis not present

## 2022-02-16 DIAGNOSIS — F32A Depression, unspecified: Secondary | ICD-10-CM

## 2022-02-16 DIAGNOSIS — E782 Mixed hyperlipidemia: Secondary | ICD-10-CM | POA: Diagnosis not present

## 2022-02-16 DIAGNOSIS — I10 Essential (primary) hypertension: Secondary | ICD-10-CM | POA: Diagnosis not present

## 2022-02-16 DIAGNOSIS — F419 Anxiety disorder, unspecified: Secondary | ICD-10-CM

## 2022-02-16 LAB — LIPID PANEL
Cholesterol: 202 mg/dL — ABNORMAL HIGH (ref 0–200)
HDL: 46.4 mg/dL (ref >39.00)
NonHDL: 155.17
Total CHOL/HDL Ratio: 4
Triglycerides: 201 mg/dL — ABNORMAL HIGH (ref 0.0–149.0)
VLDL: 40.2 mg/dL — ABNORMAL HIGH (ref 0.0–40.0)

## 2022-02-16 LAB — COMPREHENSIVE METABOLIC PANEL
ALT: 25 U/L (ref 0–35)
AST: 28 U/L (ref 0–37)
Albumin: 4.5 g/dL (ref 3.5–5.2)
Alkaline Phosphatase: 81 U/L (ref 39–117)
BUN: 25 mg/dL — ABNORMAL HIGH (ref 6–23)
CO2: 30 mEq/L (ref 19–32)
Calcium: 9.8 mg/dL (ref 8.4–10.5)
Chloride: 101 mEq/L (ref 96–112)
Creatinine, Ser: 0.69 mg/dL (ref 0.40–1.20)
GFR: 92.4 mL/min (ref >60.00)
Glucose, Bld: 94 mg/dL (ref 70–99)
Potassium: 4.3 mEq/L (ref 3.5–5.1)
Sodium: 139 mEq/L (ref 135–145)
Total Bilirubin: 0.7 mg/dL (ref 0.2–1.2)
Total Protein: 6.9 g/dL (ref 6.0–8.3)

## 2022-02-16 LAB — CBC
HCT: 43.6 % (ref 36.0–46.0)
Hemoglobin: 14.8 g/dL (ref 12.0–15.0)
MCHC: 34 g/dL (ref 30.0–36.0)
MCV: 96.1 fl (ref 78.0–100.0)
Platelets: 350 10*3/uL (ref 150.0–400.0)
RBC: 4.54 Mil/uL (ref 3.87–5.11)
RDW: 13.1 % (ref 11.5–15.5)
WBC: 6.1 10*3/uL (ref 4.0–10.5)

## 2022-02-16 LAB — TSH: TSH: 1.32 u[IU]/mL (ref 0.35–5.50)

## 2022-02-16 LAB — LDL CHOLESTEROL, DIRECT: Direct LDL: 132 mg/dL

## 2022-02-16 MED ORDER — AMPHETAMINE-DEXTROAMPHETAMINE 5 MG PO TABS: 5.0000 mg | ORAL_TABLET | Freq: Every day | ORAL | 0 refills | Status: DC

## 2022-02-16 MED ORDER — ZALEPLON 10 MG PO CAPS: ORAL_CAPSULE | ORAL | 0 refills | Status: DC

## 2022-02-16 MED ORDER — ATORVASTATIN CALCIUM 20 MG PO TABS: 20.0000 mg | ORAL_TABLET | Freq: Every day | ORAL | 1 refills | Status: DC

## 2022-02-16 MED ORDER — SUMATRIPTAN SUCCINATE 100 MG PO TABS: ORAL_TABLET | ORAL | 0 refills | Status: DC

## 2022-02-16 MED ORDER — VALSARTAN-HYDROCHLOROTHIAZIDE 160-12.5 MG PO TABS: ORAL_TABLET | ORAL | 1 refills | Status: DC

## 2022-02-16 MED ORDER — SERTRALINE HCL 50 MG PO TABS: 50.0000 mg | ORAL_TABLET | Freq: Every day | ORAL | 1 refills | Status: DC

## 2022-02-16 MED ORDER — ESTROGENS CONJUGATED 0.625 MG/GM VA CREA: 1.0000 | TOPICAL_CREAM | VAGINAL | 5 refills | Status: DC

## 2022-02-16 MED ORDER — TRIMETHOPRIM 100 MG PO TABS: 100.0000 mg | ORAL_TABLET | Freq: Every day | ORAL | 1 refills | Status: DC | PRN

## 2022-02-16 MED ORDER — ALBUTEROL SULFATE HFA 108 (90 BASE) MCG/ACT IN AERS: INHALATION_SPRAY | RESPIRATORY_TRACT | 5 refills | Status: DC

## 2022-02-16 NOTE — Patient Instructions (Signed)
Thank you for choosing [facility] at [facility] for your Primary Care needs. I am excited for the opportunity to partner with you to meet your health care goals. It was a pleasure meeting you today! ? ? ? ?Information on diet, exercise, and health maintenance recommendations are listed below. This is information to help you be sure you are on track for optimal health and monitoring.  ? ?Please look over this and let us know if you have any questions or if you have completed any of the health maintenance outside of [facility] so that we can be sure your records are up to date.  ?___________________________________________________________ ? ?MyChart:  ?For all urgent or time sensitive needs we ask that you please call the office to avoid delays. Our number is [phone]. ?MyChart is not constantly monitored and due to the large volume of messages a day, replies may take up to 72 business hours. ? ?MyChart Policy: ?MyChart allows for you to see your visit notes, after visit summary, provider recommendations, lab and tests results, make an appointment, request refills, and contact your provider or the office for non-urgent questions or concerns. Providers are seeing patients during normal business hours and do not have built in time to review MyChart messages.  ?We ask that you allow a minimum of 3 business days for responses to [employer]. For this reason, please do not send urgent requests through [facility]. Please call the office at [phone]. ?New and ongoing conditions may require a visit. We have virtual and in-person visits available for your convenience.  ?Complex MyChart concerns may require a visit. Your provider may request you schedule a virtual or in-person visit to ensure we are providing the best care possible. ?MyChart messages sent after 11:00 AM on Friday will not be received by the provider until Monday morning.  ?  ?Lab and Test Results: ?You will receive your lab and  test results on MyChart as soon as they are completed and results have been sent by the lab or testing facility. Due to this service, you will receive your results BEFORE your provider.  ?I review lab and test results each morning prior to seeing patients. Some results require collaboration with other providers to ensure you are receiving the most appropriate care. For this reason, we ask that you please allow a minimum of 3-5 business days from the time that ALL results have been received for your provider to receive and review lab and test results and contact you about these.  ?Most lab and test result comments from the provider will be sent through [facility]. Your provider may recommend changes to the plan of care, follow-up visits, repeat testing, ask questions, or request an office visit to discuss these results. You may reply directly to this message or call the office to provide information for the provider or set up an appointment. ?In some instances, you will be called with test results and recommendations. Please let us know if this is preferred and we will make note of this in your chart to provide this for you.    ?If you have not heard a response to your lab or test results in 5 business days from all results returning to [place], please call the office to let us know. We ask that you please avoid calling prior to this time unless there is an emergent concern. Due to high call volumes, this can delay the resulting process. ? ?After Hours: ?For all non-emergency after hours  needs, please call the office at [phone] and select the option to reach the on-call  service. On-call services are shared between multiple [facility] offices and therefore it will not be possible to speak directly with your provider. On-call providers may provide medical advice and recommendations, but are unable to provide refills for maintenance medications.  ?For all emergency or urgent medical needs after normal business  hours, we recommend that you seek care at the closest Urgent Care or Emergency Department to ensure appropriate treatment in a timely manner.  ?[facility] has a 24 hour emergency room located on the ground floor for your convenience.  ? ?Urgent Concerns During the Business Day ?Providers are seeing patients from 8AM to [place] with a busy schedule and are most often not able to respond to non-urgent calls until the end of the day or the next business day. ?If you should have URGENT concerns during the day, please call and speak to the nurse or schedule a same day appointment so that we can address your concern without delay.  ? ?Thank you, again, for choosing me as your health care partner. I appreciate your trust and look forward to learning more about you.  ? ?[employer]. Olevia Bowens, DNP, FNP-C ? ?___________________________________________________________ ? ?Health Maintenance Recommendations ?Screening Testing ?Mammogram ?Every 1-2 years based on history and risk factors ?Starting at age 84 ?Pap Smear ?Ages 21-39 every 3 years ?Ages 35-65 every 5 years with HPV testing ?More frequent testing may be required based on results and history ?Colon Cancer Screening ?Every 1-10 years based on test performed, risk factors, and history ?Starting at age 43 ?Bone Density Screening ?Every 2-10 years based on history ?Starting at age 66 for women ?Recommendations for men differ based on medication usage, history, and risk factors ?AAA Screening ?One time ultrasound ?Men 10-68 years old who have ever smoked ?Lung Cancer Screening ?Low Dose Lung CT every 12 months ?Age 42-80 years with a 20 pack-year smoking history who still smoke or who have quit within the last 15 years ? ?Screening Labs ?Routine  Labs: Complete Blood Count (CBC), Complete Metabolic Panel (CMP), Cholesterol (Lipid Panel) ?Every 6-12 months based on history and medications ?May be recommended more frequently based on current conditions or  previous results ?Hemoglobin A1c Lab ?Every 3-12 months based on history and previous results ?Starting at age 29 or earlier with diagnosis of diabetes, high cholesterol, BMI >26, and/or risk factors ?Frequent monitoring for patients with diabetes to ensure blood sugar control ?Thyroid Panel (TSH w/ T3 & T4) ?Every 6 months based on history, symptoms, and risk factors ?May be repeated more often if on medication ?HIV ?One time testing for all patients 29 and older ?May be repeated more frequently for patients with increased risk factors or exposure ?Hepatitis C ?One time testing for all patients 60 and older ?May be repeated more frequently for patients with increased risk factors or exposure ?Gonorrhea, Chlamydia ?Every 12 months for all sexually active persons 13-24 years ?Additional monitoring may be recommended for those who are considered high risk or who have symptoms ?PSA ?Men 64-53 years old with risk factors ?Additional screening may be recommended from age 6-69 based on risk factors, symptoms, and history ? ?Vaccine Recommendations ?Tetanus Booster ?All adults every 10 years ?Flu Vaccine ?All patients 6 months and older every year ?COVID Vaccine ?All patients 12 years and older ?Initial dosing with booster ?May recommend additional booster based on age and health history ?HPV Vaccine ?2 doses all  patients age 82-26 ?Dosing may be considered for patients over 26 ?Shingles Vaccine (Shingrix) ?2 doses all adults 50 years and older ?Pneumonia (Pneumovax 23) ?All adults 52 years and older ?May recommend earlier dosing based on health history ?Pneumonia (Prevnar 36) ?All adults 93 years and older ?Dosed 1 year after Pneumovax 23 ?Pneumonia (Prevnar 45) ?All adults 51 years and older (adults 24-26 with certain conditions or risk factors) ?1 dose  ?For those who have no received Prevnar 13 vaccine previously ? ? ?Additional Screening, Testing, and Vaccinations may be recommended on an individualized basis based on  family history, health history, risk factors, and/or exposure.  ?__________________________________________________________ ? ?Diet Recommendations for All Patients ? ?I recommend that all patients maintain a di

## 2022-02-16 NOTE — Progress Notes (Signed)
? ?______________________________________________________________________ ? ?HPI ?Kristin Benjamin is a 66 y.o. female presenting to [place] at [facility] today to establish care. Former PCP is retiring soon. ? ?Patient Care Team: ?Terrilyn Saver, NP as PCP - General (Family Medicine) ? ?Health Maintenance  ?Topic Date Due  ? HIV Screening  Never done  ? Hepatitis C Screening: USPSTF Recommendation to screen - Ages 2-79 yo.  Never done  ? Pap Smear  Never done  ? Mammogram  12/06/2008  ? Zoster (Shingles) Vaccine (2 of 2) 10/01/2019  ? Flu Shot  06/13/2022  ? Colon Cancer Screening  12/01/2025  ? Tetanus Vaccine  12/14/2029  ? COVID-19 Vaccine  Completed  ? HPV Vaccine  Aged Out  ? ? ? ?Concerns today: ?Hx of migraines - has significantly imporved after menopause, rarely needs Imitrex ?Hx of chronic UTIs - specialist GYN/urologist recommended Premarin cream every 2 days and trimethoprim after intercourse - seems to be working well, no concerns ?Trouble staying asleep  - falls asleep okay but often wakes up within a few hours and can't fall back asleep; has tried CBD, melatonin occasionally; Sonata seems to be working pretty well ?Hx total left knee replacement; right knee needs to be replaced (Dr. Ninfa Linden - gives cortisone injections every 5 months or so); plays pickle ball every day and swims regularly   ?Smoking a pack a day for 30 years; has quit multiple times, but always picks it back up; about to try patches again; agreeable to lung cancer screening program referral  ?Pap/mammo - up to date with OBGYN, requesting records  ?HTN/HLD - Stable, lifestyle management plus Lipitor and Diovan, no chest pain, dyspnea, edema, vision changes, recurrent headaches  ? ? ?Patient Active Problem List  ? Diagnosis Date Noted  ? Pes anserinus bursitis of right knee 02/16/2020  ? Unilateral primary osteoarthritis, left knee 10/18/2018  ? Status post total left knee replacement 10/18/2018  ? Unilateral  primary osteoarthritis, right knee 03/28/2018  ? Chronic pain of right knee 03/28/2018  ? Hx of adenomatous colonic polyps 07/17/2017  ? ? ? ?______________________________________________________________________ ?PMH ?Past Medical History:  ?Diagnosis Date  ? Allergy   ? Anxiety   ? Arthritis   ? Depression   ? Headache   ? history of  ? Heart murmur   ? in the past  ? Hx of adenomatous colonic polyps 07/17/2017  ? Hyperlipidemia   ? Hypertension   ? ? ?ROS ?All review of systems negative except what is listed in the HPI ? ?PHYSICAL EXAM ?Physical Exam ?Vitals reviewed.  ?Constitutional:   ?   Appearance: Normal appearance. She is normal weight.  ?HENT:  ?   Head: Normocephalic and atraumatic.  ?Cardiovascular:  ?   Rate and Rhythm: Normal rate and regular rhythm.  ?Pulmonary:  ?   Effort: Pulmonary effort is normal.  ?   Breath sounds: Normal breath sounds.  ?Skin: ?   General: Skin is warm and dry.  ?Neurological:  ?   General: No focal deficit present.  ?   Mental Status: She is alert and oriented to person, place, and time. Mental status is at baseline.  ?Psychiatric:     ?   Mood and Affect: Mood normal.     ?   Behavior: Behavior normal.     ?   Thought Content: Thought content normal.     ?   Judgment: Judgment normal.  ? ?______________________________________________________________________ ?ASSESSMENT AND PLAN ? ? ?1. Insomnia, unspecified  type ?Doing well with Sonata as needed and OTC remedies, sleep hygiene. No new concerns at this time.  ?- zaleplon (SONATA) 10 MG capsule; 1 - 2 capsule during night as needed  Dispense: 30 capsule; Refill: 0 ? ?2. Attention deficit hyperactivity disorder (ADHD), unspecified ADHD type ?Doing well on current dose. PDMP reviewed and refills sent in for the next 3 months.  ?- amphetamine-dextroamphetamine (ADDERALL) 5 MG tablet; Take 1 tablet (5 mg total) by mouth daily.  Dispense: 30 tablet; Refill: 0 ?- amphetamine-dextroamphetamine (ADDERALL) 5 MG tablet; Take 1 tablet  (5 mg total) by mouth daily.  Dispense: 30 tablet; Refill: 0 ?- amphetamine-dextroamphetamine (ADDERALL) 5 MG tablet; Take 1 tablet (5 mg total) by mouth daily.  Dispense: 30 tablet; Refill: 0 ? ?3. Primary hypertension ?Stable. Continue Diovan, refill sent. Focus on heart healthy diet and regular exercise. Updating labs today.  ?- CBC ?- Comprehensive metabolic panel ?- Lipid panel ?- TSH ?- valsartan-hydrochlorothiazide (DIOVAN-HCT) 160-12.5 MG tablet; valsartan 160 mg-hydrochlorothiazide 12.5 mg tablet daily  Dispense: 90 tablet; Refill: 1 ? ?4. Mixed hyperlipidemia ?Stable per patient. Updating labs today. Continue Lipitor, refill sent.  ?- CBC ?- Comprehensive metabolic panel ?- Lipid panel ?- TSH ?- atorvastatin (LIPITOR) 20 MG tablet; Take 1 tablet (20 mg total) by mouth daily.  Dispense: 90 tablet; Refill: 1 ? ?5. Frequent UTI ?No current complaints. Regimen of Premarin and trimethoprim from previous GYN is working really well.  ?- conjugated estrogens (PREMARIN) vaginal cream; Place 1 Applicatorful vaginally 3 (three) times a week.  Dispense: 42.5 g; Refill: 5 ?- trimethoprim (TRIMPEX) 100 MG tablet; Take 1 tablet (100 mg total) by mouth daily as needed (after intercourse).  Dispense: 20 tablet; Refill: 1 ? ?6. Migraine without status migrainosus, not intractable, unspecified migraine type ?Well controlled. Refilling Imitrex for as needed use.  ?- SUMAtriptan (IMITREX) 100 MG tablet; sumatriptan 100 mg tablet  Dispense: 10 tablet; Refill: 0 ? ?7. Smoking history ?Reports she is about to try quitting again (already has patches). Agreeable to lung cancer screening program referral. No new symptoms. Keeps albuterol on hand for as needed dyspnea, needs a refill.  ?- Ambulatory Referral Lung Cancer Screening [facility] ?- albuterol (VENTOLIN HFA) 108 (90 Base) MCG/ACT inhaler; SMARTSIG:2 Puff(s) Via Inhaler 4 Times Daily PRN  Dispense: 1 each; Refill: 5 ? ?8. Anxiety and depression ?Stable. Continue  Zoloft 50 mg daily. No SI/HI. Continue stress management techniques.  ?- sertraline (ZOLOFT) 50 MG tablet; Take 1 tablet (50 mg total) by mouth daily.  Dispense: 90 tablet; Refill: 1 ? ? ?Establish care ?Education provided today during visit and on AVS for patient to review at home.  ?Diet and Exercise recommendations provided.  ?Current diagnoses and recommendations discussed. ?HM recommendations reviewed with recommendations.  ? ? ?Outpatient Encounter Medications as of 02/16/2022  ?Medication Sig  ? [START ON 03/18/2022] amphetamine-dextroamphetamine (ADDERALL) 5 MG tablet Take 1 tablet (5 mg total) by mouth daily.  ? [START ON 04/17/2022] amphetamine-dextroamphetamine (ADDERALL) 5 MG tablet Take 1 tablet (5 mg total) by mouth daily.  ? [START ON 02/17/2022] conjugated estrogens (PREMARIN) vaginal cream Place 1 Applicatorful vaginally 3 (three) times a week.  ? [DISCONTINUED] amphetamine-dextroamphetamine (ADDERALL) 5 MG tablet Take 5 mg by mouth daily.   ? [DISCONTINUED] atorvastatin (LIPITOR) 20 MG tablet Take 20 mg by mouth daily.  ? [DISCONTINUED] conjugated estrogens (PREMARIN) vaginal cream Place 1 Applicatorful vaginally 3 (three) times a week.   ? [DISCONTINUED] sertraline (ZOLOFT) 50 MG tablet Take 50 mg  by mouth daily.  ? [DISCONTINUED] SUMAtriptan (IMITREX) 100 MG tablet sumatriptan 100 mg tablet  ? [DISCONTINUED] trimethoprim (TRIMPEX) 100 MG tablet Take by mouth.  ? [DISCONTINUED] valsartan-hydrochlorothiazide (DIOVAN-HCT) 160-12.5 MG tablet valsartan 160 mg-hydrochlorothiazide 12.5 mg tablet  ? albuterol (VENTOLIN HFA) 108 (90 Base) MCG/ACT inhaler SMARTSIG:2 Puff(s) Via Inhaler 4 Times Daily PRN  ? amphetamine-dextroamphetamine (ADDERALL) 5 MG tablet Take 1 tablet (5 mg total) by mouth daily.  ? atorvastatin (LIPITOR) 20 MG tablet Take 1 tablet (20 mg total) by mouth daily.  ? sertraline (ZOLOFT) 50 MG tablet Take 1 tablet (50 mg total) by mouth daily.  ? SUMAtriptan (IMITREX) 100 MG tablet sumatriptan 100  mg tablet  ? trimethoprim (TRIMPEX) 100 MG tablet Take 1 tablet (100 mg total) by mouth daily as needed (after intercourse).  ? valsartan-hydrochlorothiazide (DIOVAN-HCT) 160-12.5 MG tablet valsartan 160 m

## 2022-02-17 ENCOUNTER — Encounter: Payer: Self-pay | Admitting: Family Medicine

## 2022-02-17 DIAGNOSIS — E782 Mixed hyperlipidemia: Secondary | ICD-10-CM

## 2022-03-02 ENCOUNTER — Telehealth: Payer: Self-pay | Admitting: Family Medicine

## 2022-03-02 NOTE — Telephone Encounter (Signed)
Recv'd records from [facility] forwarded 27 pages to Dr. Caleen Jobs 4/20/23fbg ?

## 2022-03-17 ENCOUNTER — Encounter: Payer: Self-pay | Admitting: *Deleted

## 2022-03-28 ENCOUNTER — Telehealth: Payer: Self-pay | Admitting: Family Medicine

## 2022-03-28 NOTE — Telephone Encounter (Signed)
Patient would like an update on her lung screening, she states last time she was here [facility] told her she would check if insurance would cover it. Please advise.  ?

## 2022-03-31 ENCOUNTER — Other Ambulatory Visit (INDEPENDENT_AMBULATORY_CARE_PROVIDER_SITE_OTHER): Payer: 59

## 2022-03-31 DIAGNOSIS — E782 Mixed hyperlipidemia: Secondary | ICD-10-CM

## 2022-03-31 LAB — LIPID PANEL
Cholesterol: 231 mg/dL — ABNORMAL HIGH (ref 0–200)
HDL: 47.7 mg/dL (ref >39.00)
NonHDL: 183.49
Total CHOL/HDL Ratio: 5
Triglycerides: 254 mg/dL — ABNORMAL HIGH (ref 0.0–149.0)
VLDL: 50.8 mg/dL — ABNORMAL HIGH (ref 0.0–40.0)

## 2022-03-31 LAB — LDL CHOLESTEROL, DIRECT: Direct LDL: 143 mg/dL

## 2022-03-31 NOTE — Telephone Encounter (Signed)
Advised pt that referral was placed and that it will be covered by ins.  Gave pt the number to pulmonology so she can try to schedule herself.

## 2022-04-04 ENCOUNTER — Other Ambulatory Visit: Payer: Self-pay

## 2022-04-04 MED ORDER — ATORVASTATIN CALCIUM 40 MG PO TABS: 40.0000 mg | ORAL_TABLET | Freq: Every day | ORAL | 3 refills | Status: DC

## 2022-04-05 ENCOUNTER — Other Ambulatory Visit: Payer: Self-pay

## 2022-04-05 DIAGNOSIS — F17210 Nicotine dependence, cigarettes, uncomplicated: Secondary | ICD-10-CM

## 2022-04-05 DIAGNOSIS — Z122 Encounter for screening for malignant neoplasm of respiratory organs: Secondary | ICD-10-CM

## 2022-04-05 DIAGNOSIS — Z87891 Personal history of nicotine dependence: Secondary | ICD-10-CM

## 2022-04-13 ENCOUNTER — Other Ambulatory Visit: Payer: Self-pay | Admitting: Family Medicine

## 2022-04-13 DIAGNOSIS — G4700 Insomnia, unspecified: Secondary | ICD-10-CM

## 2022-04-17 ENCOUNTER — Ambulatory Visit: Payer: 59 | Admitting: Orthopaedic Surgery

## 2022-04-24 ENCOUNTER — Ambulatory Visit: Payer: 59 | Admitting: Orthopaedic Surgery

## 2022-04-24 ENCOUNTER — Encounter: Payer: Self-pay | Admitting: Orthopaedic Surgery

## 2022-04-24 DIAGNOSIS — M25561 Pain in right knee: Secondary | ICD-10-CM

## 2022-04-24 DIAGNOSIS — G8929 Other chronic pain: Secondary | ICD-10-CM

## 2022-04-24 MED ORDER — METHYLPREDNISOLONE ACETATE 40 MG/ML IJ SUSP
40.0000 mg | INTRAMUSCULAR | Status: AC | PRN
  Administered 2022-04-24: 40 mg via INTRA_ARTICULAR

## 2022-04-24 MED ORDER — LIDOCAINE HCL 1 % IJ SOLN
3.0000 mL | INTRAMUSCULAR | Status: AC | PRN
  Administered 2022-04-24: 3 mL

## 2022-04-24 NOTE — Progress Notes (Signed)
Office Visit Note   Patient: Kristin Benjamin           Date of Birth: 11/17/1955           MRN: [id number] Visit Date: 04/24/2022              Requested by: Terrilyn Saver, NP [address] [place],  [place] [address] PCP: Terrilyn Saver, NP   Assessment & Plan: Visit Diagnoses:  1. Chronic pain of right knee     Plan: I did place a steroid injection in her right knee today.  If her pain stays consistent with that knee I would like to obtain an MRI of the right knee to rule out a meniscal tear and to assess the cartilage.  She will let us know.  All questions and concerns were answered and addressed.  Follow-Up Instructions: Return if symptoms worsen or fail to improve.   Orders:  Orders Placed This Encounter  Procedures   Large Joint Inj   No orders of the defined types were placed in this encounter.     Procedures: Large Joint Inj: R knee on 04/24/2022 9:04 AM Indications: diagnostic evaluation and pain Details: 22 G 1.5 in needle, superolateral approach  Arthrogram: No  Medications: 3 mL lidocaine 1 %; 40 mg methylPREDNISolone acetate 40 MG/ML Outcome: tolerated well, no immediate complications Procedure, treatment alternatives, risks and benefits explained, specific risks discussed. Consent was given by the patient. Immediately prior to procedure a time out was called to verify the correct patient, procedure, equipment, support staff and site/side marked as required. Patient was prepped and draped in the usual sterile fashion.       Clinical Data: No additional findings.   Subjective: Chief Complaint  Patient presents with   Right Knee - Pain, Follow-up  Kristin Benjamin is well-known to me.  We replaced her left knee in 2019 and she said that knee is doing well.  She is very active and is athletic.  She does have some chronic pain in her right knee.  I last placed a steroid injection in her right knee almost 8 months ago.  She said this started hurting  her recently with no locking catching.  She is requesting a steroid injection again today.  Said the pain comes and goes.  It does not sound there is locking catching.  X-rays of her right knee last year showed a well-maintained joint space.  HPI  Review of Systems There is currently listed no fever, chills, nausea, vomiting  Objective: Vital Signs: There were no vitals taken for this visit.  Physical Exam She is alert and oriented x3 is in no acute distress Ortho Exam Examination of the right knee shows no effusion with excellent range of motion and some global tenderness.  The knee feels ligamentously stable.  Both knees slightly hyperextend. Specialty Comments:  No specialty comments available.  Imaging: No results found.   PMFS History: Patient Active Problem List   Diagnosis Date Noted   Pes anserinus bursitis of right knee 02/16/2020   Unilateral primary osteoarthritis, left knee 10/18/2018   Status post total left knee replacement 10/18/2018   Unilateral primary osteoarthritis, right knee 03/28/2018   Chronic pain of right knee 03/28/2018   Hx of adenomatous colonic polyps 07/17/2017   Past Medical History:  Diagnosis Date   Allergy    Anxiety    Arthritis    Depression    Headache    history of  Heart murmur    in the past   Hx of adenomatous colonic polyps 07/17/2017   Hyperlipidemia    Hypertension     Family History  Problem Relation Age of Onset   Hyperlipidemia Mother    Arthritis Mother    Depression Father    Hyperlipidemia Father    Diabetes Father    Alcohol abuse Father    Arthritis Father    Arthritis Sister    Hyperlipidemia Sister    Arthritis Sister    Depression Sister    Hyperlipidemia Sister    Hyperlipidemia Maternal Grandmother    Arthritis Maternal Grandmother    Stroke Maternal Grandfather    Hyperlipidemia Maternal Grandfather    Arthritis Maternal Grandfather    Hyperlipidemia Paternal Grandmother    Arthritis Paternal  Grandmother    Hyperlipidemia Paternal Grandfather    Arthritis Paternal Grandfather    Colon cancer Neg Hx    Esophageal cancer Neg Hx    Rectal cancer Neg Hx    Stomach cancer Neg Hx    Colon polyps Neg Hx     Past Surgical History:  Procedure Laterality Date   bladder tack     COLONOSCOPY  2006   and 2018   FUNCTIONAL ENDOSCOPIC SINUS SURGERY     JOINT REPLACEMENT     Left total knee arthroplasty Dr. Ninfa Linden 10-18-18   KNEE ARTHROSCOPY W/ MENISCAL REPAIR Right 08/2016   TOTAL KNEE ARTHROPLASTY Left 10/18/2018   Procedure: LEFT TOTAL KNEE ARTHROPLASTY;  Surgeon: Mcarthur Rossetti, MD;  Location: [facility];  Service: Orthopedics;  Laterality: Left;   TUBAL LIGATION  1993   Social History   Occupational History   Occupation: retired  Tobacco Use   Smoking status: Every Day    Packs/day: 1.00    Types: Cigarettes   Smokeless tobacco: Never  Vaping Use   Vaping Use: Never used  Substance and Sexual Activity   Alcohol use: Yes    Alcohol/week: 3.0 - 5.0 standard drinks of alcohol    Types: 3 - 5 Glasses of wine per week    Comment: 3-5 wine per week per pt   Drug use: Yes    Types: Marijuana    Comment: occasionally--monthly   Sexual activity: Yes

## 2022-04-27 ENCOUNTER — Encounter: Payer: Self-pay | Admitting: Family Medicine

## 2022-04-27 ENCOUNTER — Ambulatory Visit (INDEPENDENT_AMBULATORY_CARE_PROVIDER_SITE_OTHER): Payer: 59 | Admitting: Family Medicine

## 2022-04-27 VITALS — BP 115/76 | HR 79 | Ht 68.0 in | Wt 171.4 lb

## 2022-04-27 DIAGNOSIS — I10 Essential (primary) hypertension: Secondary | ICD-10-CM | POA: Diagnosis not present

## 2022-04-27 DIAGNOSIS — F419 Anxiety disorder, unspecified: Secondary | ICD-10-CM | POA: Insufficient documentation

## 2022-04-27 DIAGNOSIS — Z0000 Encounter for general adult medical examination without abnormal findings: Secondary | ICD-10-CM | POA: Diagnosis not present

## 2022-04-27 DIAGNOSIS — Z1231 Encounter for screening mammogram for malignant neoplasm of breast: Secondary | ICD-10-CM

## 2022-04-27 DIAGNOSIS — F32A Depression, unspecified: Secondary | ICD-10-CM | POA: Insufficient documentation

## 2022-04-27 DIAGNOSIS — E782 Mixed hyperlipidemia: Secondary | ICD-10-CM | POA: Diagnosis not present

## 2022-04-27 NOTE — Progress Notes (Signed)
Complete physical exam  Patient: Kristin Benjamin   DOB: December 28, 1955   66 y.o. Female  MRN: [id number]  Subjective:    CC: CPE   ANUSHA CLAUS is a 66 y.o. female who presents today for a complete physical exam. She reports consuming a general diet. Home exercise routine includes pickle ball, swims multiple times a week. She generally feels well. She reports sleeping well. She does not have additional problems to discuss today.     Most recent fall risk assessment:    04/27/2022    8:46 AM  Fall Risk   Falls in the past year? 0  Number falls in past yr: 0  Injury with Fall? 0  Risk for fall due to : No Fall Risks  Follow up Falls evaluation completed     Most recent depression screenings:    04/27/2022    8:46 AM  PHQ 2/9 Scores  PHQ - 2 Score 0    Vision:Within last year, Dental: No current dental problems and Receives regular dental care, and STD: no concerns  Patient Active Problem List   Diagnosis Date Noted   Mixed hyperlipidemia 04/27/2022   Primary hypertension 04/27/2022   Anxiety and depression 04/27/2022   Pes anserinus bursitis of right knee 02/16/2020   Unilateral primary osteoarthritis, left knee 10/18/2018   Status post total left knee replacement 10/18/2018   Unilateral primary osteoarthritis, right knee 03/28/2018   Chronic pain of right knee 03/28/2018   Hx of adenomatous colonic polyps 07/17/2017   Past Medical History:  Diagnosis Date   Allergy    Anxiety    Arthritis    Depression    Headache    history of   Heart murmur    in the past   Hx of adenomatous colonic polyps 07/17/2017   Hyperlipidemia    Hypertension    Family History  Problem Relation Age of Onset   Hyperlipidemia Mother    Arthritis Mother    Depression Father    Hyperlipidemia Father    Diabetes Father    Alcohol abuse Father    Arthritis Father    Arthritis Sister    Hyperlipidemia Sister    Arthritis Sister    Depression Sister    Hyperlipidemia Sister     Hyperlipidemia Maternal Grandmother    Arthritis Maternal Grandmother    Stroke Maternal Grandfather    Hyperlipidemia Maternal Grandfather    Arthritis Maternal Grandfather    Hyperlipidemia Paternal Grandmother    Arthritis Paternal Grandmother    Hyperlipidemia Paternal Grandfather    Arthritis Paternal Grandfather    Colon cancer Neg Hx    Esophageal cancer Neg Hx    Rectal cancer Neg Hx    Stomach cancer Neg Hx    Colon polyps Neg Hx    No Known Allergies    Patient Care Team: Terrilyn Saver, NP as PCP - General (Family Medicine)   Outpatient Medications Prior to Visit  Medication Sig   amphetamine-dextroamphetamine (ADDERALL) 5 MG tablet Take 1 tablet (5 mg total) by mouth daily.   atorvastatin (LIPITOR) 40 MG tablet Take 1 tablet (40 mg total) by mouth daily. (Patient taking differently: Take 20 mg by mouth daily.)   conjugated estrogens (PREMARIN) vaginal cream Place 1 Applicatorful vaginally 3 (three) times a week.   sertraline (ZOLOFT) 50 MG tablet Take 1 tablet (50 mg total) by mouth daily.   SUMAtriptan (IMITREX) 100 MG tablet sumatriptan 100 mg tablet   trimethoprim (TRIMPEX) 100 MG  tablet Take 1 tablet (100 mg total) by mouth daily as needed (after intercourse).   valsartan-hydrochlorothiazide (DIOVAN-HCT) 160-12.5 MG tablet valsartan 160 mg-hydrochlorothiazide 12.5 mg tablet daily   zaleplon (SONATA) 10 MG capsule TAKE 1-2 CAPSULES AT NIGHT AS NEEDED   [DISCONTINUED] albuterol (VENTOLIN HFA) 108 (90 Base) MCG/ACT inhaler SMARTSIG:2 Puff(s) Via Inhaler 4 Times Daily PRN   [DISCONTINUED] amphetamine-dextroamphetamine (ADDERALL) 5 MG tablet Take 1 tablet (5 mg total) by mouth daily.   [DISCONTINUED] amphetamine-dextroamphetamine (ADDERALL) 5 MG tablet Take 1 tablet (5 mg total) by mouth daily.   No facility-administered medications prior to visit.    ROS All review of systems negative except what is listed in the HPI        Objective:     BP 115/76    Pulse 79   Ht '5\' 8"'$  (1.727 m)   Wt 171 lb 6.4 oz (77.7 kg)   BMI 26.06 kg/m    Physical Exam Vitals reviewed.  Constitutional:      General: She is not in acute distress.    Appearance: Normal appearance. She is normal weight. She is not ill-appearing.  HENT:     Head: Normocephalic and atraumatic.     Right Ear: Tympanic membrane normal.     Left Ear: Tympanic membrane normal.     Nose: Nose normal.     Mouth/Throat:     Mouth: Mucous membranes are moist.     Pharynx: Oropharynx is clear.  Eyes:     Extraocular Movements: Extraocular movements intact.     Conjunctiva/sclera: Conjunctivae normal.     Pupils: Pupils are equal, round, and reactive to light.  Cardiovascular:     Rate and Rhythm: Normal rate and regular rhythm.     Pulses: Normal pulses.     Heart sounds: Normal heart sounds.  Pulmonary:     Effort: Pulmonary effort is normal.     Breath sounds: Normal breath sounds.  Abdominal:     General: Abdomen is flat. Bowel sounds are normal.     Palpations: Abdomen is soft.     Tenderness: There is no right CVA tenderness or left CVA tenderness.  Genitourinary:    Comments: deferred  Musculoskeletal:        General: Normal range of motion.     Cervical back: Normal range of motion and neck supple. No tenderness.  Lymphadenopathy:     Cervical: No cervical adenopathy.  Skin:    General: Skin is warm and dry.     Capillary Refill: Capillary refill takes less than 2 seconds.  Neurological:     General: No focal deficit present.     Mental Status: She is alert and oriented to person, place, and time. Mental status is at baseline.  Psychiatric:        Mood and Affect: Mood normal.        Behavior: Behavior normal.        Thought Content: Thought content normal.        Judgment: Judgment normal.          No results found for any visits on 04/27/22.     Assessment & Plan:    Routine Health Maintenance and Physical Exam  Immunization History   Administered Date(s) Administered   Influenza,inj,quad, With Preservative 07/11/2021   PFIZER(Purple Top)SARS-COV-2 Vaccination 01/05/2020, 01/27/2020, 08/25/2020   Pfizer Covid-19 Vaccine Bivalent Booster 33yr & up 08/10/2021   Pneumococcal Polysaccharide-23 09/22/2014   Tdap 06/23/2013, 12/02/2019   Zoster Recombinat (Shingrix) 08/06/2019  Zoster, Live 06/23/2013, 08/06/2019, 11/11/2019, 12/29/2019    Health Maintenance  Topic Date Due   HIV Screening  Never done   Hepatitis C Screening  Never done   Zoster Vaccines- Shingrix (2 of 2) 10/01/2019   INFLUENZA VACCINE  06/13/2022   MAMMOGRAM  05/18/2023   PAP SMEAR-Modifier  05/18/2024   COLONOSCOPY (Pts 45-11yr Insurance coverage will need to be confirmed)  12/01/2025   TETANUS/TDAP  12/14/2029   COVID-19 Vaccine  Completed   HPV VACCINES  Aged Out    Discussed health benefits of physical activity, and encouraged her to engage in regular exercise appropriate for her age and condition.  Problem List Items Addressed This Visit       Cardiovascular and Mediastinum   Primary hypertension    Stable, at goal. Continue medications, healthy diet, and regular exercise. Labs stable 2 months ago.         Other   Mixed hyperlipidemia    Labs with mild elevation last month. Patient prefers to continue with atorvastatin 20 mg and add fish oil, supplements, healthy diet, exercise, etc. Recheck in about 3-6 months.       Anxiety and depression    Stable on current regimen. No SI/HI.      Other Visit Diagnoses     Annual physical exam    -  Primary   Encounter for screening mammogram for malignant neoplasm of breast       Relevant Orders   MM DIGITAL SCREENING BILATERAL      Return in about 6 months (around 10/27/2022) for lipid f/u .     TTerrilyn Saver NP

## 2022-04-27 NOTE — Assessment & Plan Note (Signed)
Stable, at goal. Continue medications, healthy diet, and regular exercise. Labs stable 2 months ago.

## 2022-04-27 NOTE — Assessment & Plan Note (Signed)
Stable on current regimen. No SI/HI.

## 2022-04-27 NOTE — Assessment & Plan Note (Signed)
Labs with mild elevation last month. Patient prefers to continue with atorvastatin 20 mg and add fish oil, supplements, healthy diet, exercise, etc. Recheck in about 3-6 months.

## 2022-04-28 ENCOUNTER — Ambulatory Visit (INDEPENDENT_AMBULATORY_CARE_PROVIDER_SITE_OTHER): Payer: 59 | Admitting: Acute Care

## 2022-04-28 ENCOUNTER — Encounter: Payer: Self-pay | Admitting: Acute Care

## 2022-04-28 ENCOUNTER — Encounter: Payer: Self-pay | Admitting: Family Medicine

## 2022-04-28 DIAGNOSIS — F17210 Nicotine dependence, cigarettes, uncomplicated: Secondary | ICD-10-CM

## 2022-04-28 DIAGNOSIS — Z716 Tobacco abuse counseling: Secondary | ICD-10-CM

## 2022-04-28 DIAGNOSIS — F17200 Nicotine dependence, unspecified, uncomplicated: Secondary | ICD-10-CM

## 2022-04-28 NOTE — Progress Notes (Signed)
Virtual Visit via Telephone Note  I connected with Kristin Benjamin on 09/27/21 at  2:00 PM EST by telephone and verified that I am speaking with the correct person using two identifiers.  Location: Patient: Home Provider: Working from home   I discussed the limitations, risks, security and privacy concerns of performing an evaluation and management service by telephone and the availability of in person appointments. I also discussed with the patient that there may be a patient responsible charge related to this service. The patient expressed understanding and agreed to proceed.  Shared Decision Making Visit Lung Cancer Screening Program [phone])   Eligibility: Age 66 y.o. Pack Years Smoking History Calculation 42 (# packs/per year x # years smoked) Recent History of coughing up blood  no Unexplained weight loss? no ( >Than 15 pounds within the last 6 months ) Prior History Lung / other cancer no (Diagnosis within the last 5 years already requiring surveillance chest CT Scans). Smoking Status Current Smoker Former Smokers: Years since quit: NA  Quit Date: NA  Visit Components: Discussion included one or more decision making aids. yes Discussion included risk/benefits of screening. yes Discussion included potential follow up diagnostic testing for abnormal scans. yes Discussion included meaning and risk of over diagnosis. yes Discussion included meaning and risk of False Positives. yes Discussion included meaning of total radiation exposure. yes  Counseling Included: Importance of adherence to annual lung cancer LDCT screening. yes Impact of comorbidities on ability to participate in the program. yes Ability and willingness to under diagnostic treatment. yes  Smoking Cessation Counseling: Current Smokers:  Discussed importance of smoking cessation. yes Information about tobacco cessation classes and interventions provided to patient. yes Patient provided with "ticket" for  LDCT Scan. yes Symptomatic Patient. yes  Counseling(Intermediate counseling: > three minutes) 99406 Diagnosis Code: Tobacco Use Z72.0 Asymptomatic Patient no  Counseling NA Former Smokers:  Discussed the importance of maintaining cigarette abstinence. yes Diagnosis Code: Personal History of Nicotine Dependence. Y81.448 Information about tobacco cessation classes and interventions provided to patient. Yes Patient provided with "ticket" for LDCT Scan. yes Written Order for Lung Cancer Screening with LDCT placed in Epic. Yes (CT Chest Lung Cancer Screening Low Dose W/O CM) [id number] Z12.2-Screening of respiratory organs Z87.891-Personal history of nicotine dependence   I spent 25 minutes of face to face time with her discussing the risks and benefits of lung cancer screening. We viewed a power point together that explained in detail the above noted topics. We took the time to pause the power point at intervals to allow for questions to be asked and answered to ensure understanding. We discussed that she had taken the single most powerful action possible to decrease her risk of developing lung cancer when she quit smoking. I counseled her to remain smoke free, and to contact me if she ever had the desire to smoke again so that I can provide resources and tools to help support the effort to remain smoke free. We discussed the time and location of the scan, and that either  [name] RN or I will call with the results within  24-48 hours of receiving them. she has my card and contact information in the event she needs to speak with me, in addition to a copy of the power point we reviewed as a resource. she verbalized understanding of all of the above and had no further questions upon leaving the office.     I explained to the patient that there has been a  high incidence of coronary artery disease noted on these exams. I explained that this is a non-gated exam therefore degree or severity cannot be  determined. This patient is on statin therapy. I have asked the patient to follow-up with their PCP regarding any incidental finding of coronary artery disease and management with diet or medication as they feel is clinically indicated. The patient verbalized understanding of the above and had no further questions.    I spent 3 minutes counseling on smoking cessation and the health risks of continued tobacco abuse    Naavya Postma D. Kenton Kingfisher, NP-C  Pulmonary & Critical Care Personal contact information can be found on Amion  04/28/2022, 8:44 AM

## 2022-04-28 NOTE — Patient Instructions (Signed)
Thank you for participating in the [facility]. It was our pleasure to meet you today. We will call you with the results of your scan within the next few days. Your scan will be assigned a Lung RADS category score by the physicians reading the scans.  This Lung RADS score determines follow up scanning.  See below for description of categories, and follow up screening recommendations. We will be in touch to schedule your follow up screening annually or based on recommendations of our providers. We will fax a copy of your scan results to your Primary Care Physician, or the physician who referred you to the program, to ensure they have the results. Please call the office if you have any questions or concerns regarding your scanning experience or results.  Our office number is [phone]. Please speak with Denise Phelps, RN. , or  Denise Buckner RN, They are  our Lung Cancer Screening RN.'s If They are unavailable when you call, Please leave a message on the voice mail. We will return your call at our earliest convenience.This voice mail is monitored several times a day.  Remember, if your scan is normal, we will scan you annually as long as you continue to meet the criteria for the program. (Age 55-77, Current smoker or smoker who has quit within the last 15 years). If you are a smoker, remember, quitting is the single most powerful action that you can take to decrease your risk of lung cancer and other pulmonary, breathing related problems. We know quitting is hard, and we are here to help.  Please let us know if there is anything we can do to help you meet your goal of quitting. If you are a former smoker, congratulations. We are proud of you! Remain smoke free! Remember you can refer friends or family members through the number above.  We will screen them to make sure they meet criteria for the program. Thank you for helping us take better care of you by  participating in Lung Screening.  You can receive free nicotine replacement therapy ( patches, gum or mints) by calling 1-800-QUIT NOW. Please call so we can get you on the path to becoming  a non-smoker. I know it is hard, but you can do this!  Lung RADS Categories:  Lung RADS 1: no nodules or definitely non-concerning nodules.  Recommendation is for a repeat annual scan in 12 months.  Lung RADS 2:  nodules that are non-concerning in appearance and behavior with a very low likelihood of becoming an active cancer. Recommendation is for a repeat annual scan in 12 months.  Lung RADS 3: nodules that are probably non-concerning , includes nodules with a low likelihood of becoming an active cancer.  Recommendation is for a 6-month repeat screening scan. Often noted after an upper respiratory illness. We will be in touch to make sure you have no questions, and to schedule your 6-month scan.  Lung RADS 4 A: nodules with concerning findings, recommendation is most often for a follow up scan in 3 months or additional testing based on our provider's assessment of the scan. We will be in touch to make sure you have no questions and to schedule the recommended 3 month follow up scan.  Lung RADS 4 B:  indicates findings that are concerning. We will be in touch with you to schedule additional diagnostic testing based on our provider's  assessment of the scan.  Other options for assistance in smoking cessation (   As covered by your insurance benefits)  Hypnosis for smoking cessation  [employer] [phone]  Acupuncture for smoking cessation  [facility] [phone]   

## 2022-05-01 ENCOUNTER — Ambulatory Visit (HOSPITAL_BASED_OUTPATIENT_CLINIC_OR_DEPARTMENT_OTHER)
Admission: RE | Admit: 2022-05-01 | Discharge: 2022-05-01 | Disposition: A | Discharge status: Home / Self Care | Payer: 59 | Source: Ambulatory Visit | Attending: Acute Care | Admitting: Acute Care

## 2022-05-01 DIAGNOSIS — Z122 Encounter for screening for malignant neoplasm of respiratory organs: Secondary | ICD-10-CM | POA: Insufficient documentation

## 2022-05-01 DIAGNOSIS — Z87891 Personal history of nicotine dependence: Secondary | ICD-10-CM | POA: Diagnosis present

## 2022-05-01 DIAGNOSIS — F17210 Nicotine dependence, cigarettes, uncomplicated: Secondary | ICD-10-CM | POA: Insufficient documentation

## 2022-05-02 ENCOUNTER — Other Ambulatory Visit: Payer: Self-pay | Admitting: Acute Care

## 2022-05-02 DIAGNOSIS — Z87891 Personal history of nicotine dependence: Secondary | ICD-10-CM

## 2022-05-02 DIAGNOSIS — Z122 Encounter for screening for malignant neoplasm of respiratory organs: Secondary | ICD-10-CM

## 2022-05-02 DIAGNOSIS — F17210 Nicotine dependence, cigarettes, uncomplicated: Secondary | ICD-10-CM

## 2022-05-02 MED ORDER — BUPROPION HCL ER (SR) 150 MG PO TB12: ORAL_TABLET | ORAL | 0 refills | Status: DC

## 2022-05-18 ENCOUNTER — Encounter: Payer: Self-pay | Admitting: Family Medicine

## 2022-05-18 ENCOUNTER — Ambulatory Visit (INDEPENDENT_AMBULATORY_CARE_PROVIDER_SITE_OTHER): Payer: 59 | Admitting: Family Medicine

## 2022-05-18 DIAGNOSIS — F909 Attention-deficit hyperactivity disorder, unspecified type: Secondary | ICD-10-CM | POA: Diagnosis not present

## 2022-05-18 MED ORDER — AMPHETAMINE-DEXTROAMPHETAMINE 5 MG PO TABS: 5.0000 mg | ORAL_TABLET | Freq: Every day | ORAL | 0 refills | Status: DC

## 2022-05-18 NOTE — Assessment & Plan Note (Signed)
Stable on current regimen. No side effects. Continue 5 mg Adderall daily (3 separate scripts sent in for the next 3 months). PDMP reviewed.

## 2022-05-18 NOTE — Progress Notes (Signed)
   Established Patient Office Visit  Subjective   Patient ID: Kristin Benjamin, female    DOB: 07-Aug-1956  Age: 66 y.o. MRN: [id number]  CC: ADHD f/u    HPI Patient is here for ADHD follow-up. She was seen for CPE about 2 weeks ago. Labs stable overall, but cholesterol elevated. She was increased to atorvastatin 40 mg daily and educated on lifestyle management.    ADHD/ADD: - Medications: 5 mg Adderall  - Compliance: every day  - Side effects: none; Patient denies any chest pain, palpitations, dyspnea, wheezing, edema, recurrent headaches, vision changes, appetite changes, unexplained weight loss.  - Concerns: none        ROS All review of systems negative except what is listed in the HPI    Objective:     BP 103/69   Pulse 80   Ht '5\' 8"'$  (1.727 m)   Wt 170 lb 12.8 oz (77.5 kg)   BMI 25.97 kg/m    Physical Exam Vitals reviewed.  Constitutional:      Appearance: Normal appearance.  Cardiovascular:     Rate and Rhythm: Normal rate and regular rhythm.  Pulmonary:     Effort: Pulmonary effort is normal.     Breath sounds: Normal breath sounds.  Skin:    General: Skin is warm and dry.  Neurological:     General: No focal deficit present.     Mental Status: She is alert and oriented to person, place, and time. Mental status is at baseline.  Psychiatric:        Mood and Affect: Mood normal.        Behavior: Behavior normal.        Thought Content: Thought content normal.        Judgment: Judgment normal.      No results found for any visits on 05/18/22.    The 10-year ASCVD risk score (Arnett DK, et al., 2019) is: 8.9%    Assessment & Plan:   Problem List Items Addressed This Visit       Other   Attention deficit hyperactivity disorder (ADHD)    Stable on current regimen. No side effects. Continue 5 mg Adderall daily (3 separate scripts sent in for the next 3 months). PDMP reviewed.       Relevant Medications   amphetamine-dextroamphetamine  (ADDERALL) 5 MG tablet   amphetamine-dextroamphetamine (ADDERALL) 5 MG tablet (Start on 06/17/2022)   amphetamine-dextroamphetamine (ADDERALL) 5 MG tablet (Start on 07/17/2022)    Return for November/December routine f/u with labs .    Terrilyn Saver, NP

## 2022-07-14 ENCOUNTER — Other Ambulatory Visit: Payer: Self-pay | Admitting: Family Medicine

## 2022-07-14 DIAGNOSIS — F17200 Nicotine dependence, unspecified, uncomplicated: Secondary | ICD-10-CM

## 2022-07-14 DIAGNOSIS — Z716 Tobacco abuse counseling: Secondary | ICD-10-CM

## 2022-07-31 ENCOUNTER — Encounter: Payer: Self-pay | Admitting: Family Medicine

## 2022-07-31 DIAGNOSIS — I10 Essential (primary) hypertension: Secondary | ICD-10-CM

## 2022-07-31 MED ORDER — VALSARTAN-HYDROCHLOROTHIAZIDE 160-12.5 MG PO TABS: 1.0000 | ORAL_TABLET | Freq: Every day | ORAL | 0 refills | Status: DC

## 2022-08-12 ENCOUNTER — Other Ambulatory Visit: Payer: Self-pay | Admitting: Family Medicine

## 2022-08-12 DIAGNOSIS — F419 Anxiety disorder, unspecified: Secondary | ICD-10-CM

## 2022-08-31 ENCOUNTER — Other Ambulatory Visit (HOSPITAL_BASED_OUTPATIENT_CLINIC_OR_DEPARTMENT_OTHER): Payer: Self-pay

## 2022-08-31 MED ORDER — FLUARIX QUADRIVALENT 0.5 ML IM SUSY
PREFILLED_SYRINGE | INTRAMUSCULAR | 0 refills | Status: DC
  Filled 2022-08-31: qty 0.5, 1d supply, fill #0

## 2022-08-31 MED ORDER — COMIRNATY 30 MCG/0.3ML IM SUSY
PREFILLED_SYRINGE | INTRAMUSCULAR | 0 refills | Status: DC
  Filled 2022-08-31: qty 0.3, 1d supply, fill #0

## 2022-09-24 NOTE — Progress Notes (Unsigned)
   Established Patient Office Visit  Subjective   Patient ID: Kristin Benjamin, female    DOB: [birth year]  Age: 66 y.o. MRN: [id number]  No chief complaint on file.   HPI  Patient presents for routine 10-monthfollow-up.   HYPERLIPIDEMIA - medications: Lipitor 40 mg daily - compliance: *** - medication SEs: *** The 10-year ASCVD risk score (Arnett DK, et al., 2019) is: 8.9%   Values used to calculate the score:     Age: 89years     Sex: Female     Is Non-Hispanic African American: No     Diabetic: No     Tobacco smoker: Yes     Systolic Blood Pressure: 1768mmHg     Is BP treated: Yes     HDL Cholesterol: 47.7 mg/dL     Total Cholesterol: 231 mg/dL  HYPERTENSION: - Medications: Valsartan-HCTZ 160-12.5 mg daily - Compliance: *** - Checking BP at home: *** - Denies any SOB, recurrent headaches, CP, vision changes, LE edema, dizziness, palpitations, or medication side effects. - Diet: *** - Exercise: *** - Stressors:   ANXIETY/DEPRESSION: - Medications: Zoloft 50 mg daily - Currently stable, no SI/HI  ADHD: - Adderall 5 mg daily - Currently stable, no medication side effects   TOBACCO DEPENDENCE: - Patient has been taking Wellbutrin 150 mg BID for cessation.      {History (Optional):23778}  ROS    Objective:     There were no vitals taken for this visit. {Vitals History (Optional):23777}  Physical Exam   No results found for any visits on 09/26/22.  {Labs (Optional):23779}  The 10-year ASCVD risk score (Arnett DK, et al., 2019) is: 8.9%    Assessment & Plan:   Problem List Items Addressed This Visit   None   No follow-ups on file.    TTerrilyn Saver NP

## 2022-09-25 ENCOUNTER — Ambulatory Visit (INDEPENDENT_AMBULATORY_CARE_PROVIDER_SITE_OTHER): Payer: 59

## 2022-09-25 ENCOUNTER — Ambulatory Visit: Payer: 59 | Admitting: Orthopaedic Surgery

## 2022-09-25 ENCOUNTER — Other Ambulatory Visit: Payer: Self-pay

## 2022-09-25 DIAGNOSIS — M25571 Pain in right ankle and joints of right foot: Secondary | ICD-10-CM

## 2022-09-25 NOTE — Progress Notes (Signed)
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  She has been having right ankle pain and she points to the posterior tibial tendon area of her right foot where she is hurting the most.  She has tried to rest from playing sports and she feels like there is a knot that has come up on this area months ago.  It does wake her up at night.  She denies any specific injury.  On exam and on expansion there is some fullness to the medial aspect of her foot just inferior and distal to the medial malleolus.  She can actually perform a straight toe raise easily and go up on her toes with her heel going into normal position.  She has normal-appearing arch.  She does have pain along the course of posterior tibial tendon but it is intact and functioning appropriately.  Her Achilles is also intact and the remainder of her foot exam seems to be normal.  Her arch is normal.    3 views of the right foot show no acute findings.  There is some prominence of the navicular and probably some soft tissue swelling on the medial aspect of her ankle and foot.  I would like to send her to outpatient physical therapy for any modalities that can help decrease the pain from her posterior tibial tendon.  I would like her to rest from sports and try Voltaren gel.  I would like to see her back in 4 weeks for repeat exam.  All question concerns were answered and addressed.

## 2022-09-26 ENCOUNTER — Encounter: Payer: Self-pay | Admitting: Family Medicine

## 2022-09-26 ENCOUNTER — Ambulatory Visit: Payer: 59 | Admitting: Family Medicine

## 2022-09-26 VITALS — BP 112/71 | HR 84 | Ht 68.0 in | Wt 176.0 lb

## 2022-09-26 DIAGNOSIS — F909 Attention-deficit hyperactivity disorder, unspecified type: Secondary | ICD-10-CM | POA: Diagnosis not present

## 2022-09-26 DIAGNOSIS — E782 Mixed hyperlipidemia: Secondary | ICD-10-CM

## 2022-09-26 DIAGNOSIS — I10 Essential (primary) hypertension: Secondary | ICD-10-CM | POA: Diagnosis not present

## 2022-09-26 DIAGNOSIS — G4700 Insomnia, unspecified: Secondary | ICD-10-CM

## 2022-09-26 DIAGNOSIS — F32A Depression, unspecified: Secondary | ICD-10-CM

## 2022-09-26 DIAGNOSIS — F419 Anxiety disorder, unspecified: Secondary | ICD-10-CM

## 2022-09-26 DIAGNOSIS — Z1231 Encounter for screening mammogram for malignant neoplasm of breast: Secondary | ICD-10-CM

## 2022-09-26 LAB — COMPREHENSIVE METABOLIC PANEL
ALT: 20 U/L (ref 0–35)
AST: 22 U/L (ref 0–37)
Albumin: 4.5 g/dL (ref 3.5–5.2)
Alkaline Phosphatase: 82 U/L (ref 39–117)
BUN: 23 mg/dL (ref 6–23)
CO2: 31 mEq/L (ref 19–32)
Calcium: 9.4 mg/dL (ref 8.4–10.5)
Chloride: 102 mEq/L (ref 96–112)
Creatinine, Ser: 0.72 mg/dL (ref 0.40–1.20)
GFR: 88.64 mL/min (ref >60.00)
Glucose, Bld: 94 mg/dL (ref 70–99)
Potassium: 4.3 mEq/L (ref 3.5–5.1)
Sodium: 139 mEq/L (ref 135–145)
Total Bilirubin: 0.5 mg/dL (ref 0.2–1.2)
Total Protein: 7 g/dL (ref 6.0–8.3)

## 2022-09-26 LAB — LIPID PANEL
Cholesterol: 225 mg/dL — ABNORMAL HIGH (ref 0–200)
HDL: 42.8 mg/dL (ref >39.00)
NonHDL: 182.46
Total CHOL/HDL Ratio: 5
Triglycerides: 252 mg/dL — ABNORMAL HIGH (ref 0.0–149.0)
VLDL: 50.4 mg/dL — ABNORMAL HIGH (ref 0.0–40.0)

## 2022-09-26 LAB — CBC
HCT: 44.2 % (ref 36.0–46.0)
Hemoglobin: 15 g/dL (ref 12.0–15.0)
MCHC: 34 g/dL (ref 30.0–36.0)
MCV: 97.9 fl (ref 78.0–100.0)
Platelets: 327 10*3/uL (ref 150.0–400.0)
RBC: 4.51 Mil/uL (ref 3.87–5.11)
RDW: 12.5 % (ref 11.5–15.5)
WBC: 5 10*3/uL (ref 4.0–10.5)

## 2022-09-26 LAB — LDL CHOLESTEROL, DIRECT: Direct LDL: 138 mg/dL

## 2022-09-26 MED ORDER — HYDROXYZINE PAMOATE 25 MG PO CAPS: 25.0000 mg | ORAL_CAPSULE | Freq: Three times a day (TID) | ORAL | 0 refills | Status: DC | PRN

## 2022-09-26 NOTE — Assessment & Plan Note (Signed)
Blood pressure is at goal for age and co-morbidities.   Recommendations: valsartan-HCTZ 160-12.5 mg daily - BP goal <130/80 - monitor and log blood pressures at home - check around the same time each day in a relaxed setting - Limit salt to <2000 mg/day - Follow DASH eating plan (heart healthy diet) - limit alcohol to 2 standard drinks per day for men and 1 per day for women - avoid tobacco products - get at least 2 hours of regular aerobic exercise weekly Patient aware of signs/symptoms requiring further/urgent evaluation. Labs updated today.

## 2022-09-26 NOTE — Assessment & Plan Note (Signed)
-  Reviewed most recent lipid panel -Medication management: continue Lipitor 20 mg daily -Repeat CMP and lipid panel today -Diet low in saturated fat -Regular exercise - at least 30 minutes, 5 times per week

## 2022-09-26 NOTE — Assessment & Plan Note (Signed)
Stable on Adderall 5 mg every morning. No side effects. PDMP reviewed.

## 2022-09-26 NOTE — Assessment & Plan Note (Signed)
Stable on Zoloft 50 mg. No SI/HI. Adding hydroxyzine to see if that will help at bedtime for sleeping.

## 2022-09-26 NOTE — Patient Instructions (Addendum)
Good to see you! Let's see if the hydroxyzine will help you sleep. Continue with good bedtime routine. If not improving we can talk about a sleep specialist referral.   Enjoy your trip to see the new baby!!

## 2022-09-27 NOTE — Progress Notes (Signed)
Cholesterol is pretty close to what it was 6 months ago. I think you should probably increase to the Lipitor 40 mg I had given you. Let me know if you need a new prescription. Other labs are great!   The 10-year ASCVD risk score (Arnett DK, et al., 2019) is: 10.9%   Values used to calculate the score:     Age: 66 years     Sex: Female     Is Non-Hispanic African American: No     Diabetic: No     Tobacco smoker: Yes     Systolic Blood Pressure: 179 mmHg     Is BP treated: Yes     HDL Cholesterol: 42.8 mg/dL     Total Cholesterol: 225 mg/dL

## 2022-09-30 ENCOUNTER — Encounter: Payer: Self-pay | Admitting: Family Medicine

## 2022-10-02 ENCOUNTER — Ambulatory Visit (HOSPITAL_BASED_OUTPATIENT_CLINIC_OR_DEPARTMENT_OTHER)
Admission: RE | Admit: 2022-10-02 | Discharge: 2022-10-02 | Disposition: A | Discharge status: Home / Self Care | Payer: 59 | Source: Ambulatory Visit | Attending: Family Medicine | Admitting: Family Medicine

## 2022-10-02 ENCOUNTER — Encounter (HOSPITAL_BASED_OUTPATIENT_CLINIC_OR_DEPARTMENT_OTHER): Payer: Self-pay

## 2022-10-02 DIAGNOSIS — Z1231 Encounter for screening mammogram for malignant neoplasm of breast: Secondary | ICD-10-CM | POA: Diagnosis not present

## 2022-10-02 MED ORDER — ATORVASTATIN CALCIUM 40 MG PO TABS: 40.0000 mg | ORAL_TABLET | Freq: Every day | ORAL | 1 refills | Status: DC

## 2022-10-09 ENCOUNTER — Other Ambulatory Visit: Payer: Self-pay

## 2022-10-09 DIAGNOSIS — Z716 Tobacco abuse counseling: Secondary | ICD-10-CM

## 2022-10-09 DIAGNOSIS — F17200 Nicotine dependence, unspecified, uncomplicated: Secondary | ICD-10-CM

## 2022-10-09 MED ORDER — BUPROPION HCL ER (SR) 150 MG PO TB12: 150.0000 mg | ORAL_TABLET | Freq: Two times a day (BID) | ORAL | 0 refills | Status: DC

## 2022-10-10 ENCOUNTER — Other Ambulatory Visit: Payer: Self-pay | Admitting: Family Medicine

## 2022-10-10 DIAGNOSIS — F909 Attention-deficit hyperactivity disorder, unspecified type: Secondary | ICD-10-CM

## 2022-10-11 ENCOUNTER — Ambulatory Visit: Payer: 59 | Attending: Orthopaedic Surgery | Admitting: Physical Therapy

## 2022-10-11 ENCOUNTER — Other Ambulatory Visit: Payer: Self-pay

## 2022-10-11 DIAGNOSIS — M25571 Pain in right ankle and joints of right foot: Secondary | ICD-10-CM | POA: Diagnosis not present

## 2022-10-11 DIAGNOSIS — G8929 Other chronic pain: Secondary | ICD-10-CM

## 2022-10-11 MED ORDER — AMPHETAMINE-DEXTROAMPHETAMINE 5 MG PO TABS: 5.0000 mg | ORAL_TABLET | Freq: Every day | ORAL | 0 refills | Status: DC

## 2022-10-11 NOTE — Telephone Encounter (Signed)
Requesting: Adderall  Contract: N/A  UDS: N/A Last Visit: 09/26/2022  Next Visit: N/A Last Refill: 07/17/2022  Please Advise

## 2022-10-11 NOTE — Telephone Encounter (Signed)
Patient called back and appr was made for 12/18/22.

## 2022-10-11 NOTE — Therapy (Signed)
OUTPATIENT PHYSICAL THERAPY LOWER EXTREMITY EVALUATION   Patient Name: Kristin Benjamin MRN: [id number] DOB:[birth year], 66 y.o., female Today's Date: 10/11/2022  END OF SESSION:  PT End of Session - 10/11/22 0943     Visit Number 1    Number of Visits 12    Date for PT Re-Evaluation 11/22/22    Authorization Type [employer]    PT Start Time 0930    PT Stop Time 1015    PT Time Calculation (min) 45 min    Activity Tolerance Patient tolerated treatment well    Behavior During Therapy [facility] for tasks assessed/performed             Past Medical History:  Diagnosis Date   Allergy    Anxiety    Arthritis    Depression    Headache    history of   Heart murmur    in the past   Hx of adenomatous colonic polyps 07/17/2017   Hyperlipidemia    Hypertension    Past Surgical History:  Procedure Laterality Date   bladder tack     COLONOSCOPY  2006   and 2018   FUNCTIONAL ENDOSCOPIC SINUS SURGERY     JOINT REPLACEMENT     Left total knee arthroplasty Dr. Ninfa Linden 10-18-18   KNEE ARTHROSCOPY W/ MENISCAL REPAIR Right 08/2016   TOTAL KNEE ARTHROPLASTY Left 10/18/2018   Procedure: LEFT TOTAL KNEE ARTHROPLASTY;  Surgeon: Mcarthur Rossetti, MD;  Location: [facility];  Service: Orthopedics;  Laterality: Left;   TUBAL LIGATION  1993   Patient Active Problem List   Diagnosis Date Noted   Attention deficit hyperactivity disorder (ADHD) 05/18/2022   Mixed hyperlipidemia 04/27/2022   Primary hypertension 04/27/2022   Anxiety and depression 04/27/2022   Pes anserinus bursitis of right knee 02/16/2020   Unilateral primary osteoarthritis, left knee 10/18/2018   Status post total left knee replacement 10/18/2018   Unilateral primary osteoarthritis, right knee 03/28/2018   Chronic pain of right knee 03/28/2018   Hx of adenomatous colonic polyps 07/17/2017    PCP: Terrilyn Saver, NP  REFERRING PROVIDER: Mcarthur Rossetti, MD  REFERRING DIAG: [phone] (ICD-10-CM) - Pain in right  ankle and joints of right foot  THERAPY DIAG:  Pain in right ankle and joints of right foot  Rationale for Evaluation and Treatment: Rehabilitation  ONSET DATE: several months  SUBJECTIVE:   SUBJECTIVE STATEMENT: Patient reports she is very active but has a spot on her R instep that is very painful, feels like something is pressing against nerve.  She has a small bump over this painful area, orthopedist took X-rays and there is no bone spur.  She plays pickle ball, rest did not help her symptoms at all, she can be laying in bed and have lancing pain start.  She started using an ankle brace when playing which does seem to help.  History of bil knee OA, she sees Dr. Ninfa Linden every 5 months for cortisone shots in R knee now, will need a knee replacement at some point.    PERTINENT HISTORY: L TKA, R knee OA PAIN:  Are you having pain? Yes: NPRS scale: 3-7/10 Pain location: R instep Pain description: stinging/burning Aggravating factors: intermittent, can happen at rest, or playing pickleball Relieving factors: ankle brace  PRECAUTIONS: None  WEIGHT BEARING RESTRICTIONS: No  FALLS:  Has patient fallen in last 6 months? 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   PATIENT SURVEYS:  LEFS 62/80 = 77.5%  COGNITION: Overall cognitive status: Within functional limits for tasks assessed     SENSATION: WFL  EDEMA:  none  MUSCLE  LENGTH: NT  POSTURE:  R ankle pronation > L ankle pronation in standing.  Pes cavus bil.   PALPATION: No tenderness in R calf.  Tenderness over R navicular tuberosity.   LOWER EXTREMITY MMT:  MMT Right eval  Hip flexion   Hip extension   Hip abduction   Hip adduction   Hip internal rotation   Hip external rotation   Knee flexion   Knee extension   Ankle dorsiflexion 5  Ankle plantarflexion 5  Ankle inversion 5*  Ankle eversion 5   (Blank rows = not tested) *pain with resistance   LOWER EXTREMITY MMT:  MMT Right eval  Hip flexion   Hip extension   Hip abduction   Hip adduction   Hip internal rotation   Hip external rotation   Knee flexion   Knee extension   Ankle dorsiflexion 20  Ankle plantarflexion 60  Ankle inversion 40  Ankle eversion 20   (Blank rows = not tested)  LOWER EXTREMITY SPECIAL TESTS:  Ankle special tests: Great toe extension test: negative and navicular drop - noted dropped on right 1 cm, no drop on Left.   FUNCTIONAL TESTS:    GAIT: Distance walked: 68' Assistive device utilized: None Level of assistance: Complete Independence Comments: no significant deviation or device.     TODAY'S TREATMENT:                                                                                                                              DATE:   Self Care:  education on need for orthotics to support foot, can try compression socks as well during recreational activies, POC including dry needling, and also given initial HEP for eccentric heel raises on step, to start with double limb support.     PATIENT EDUCATION:  Education details: see patient education, dry needling Person educated: Patient Education method: Explanation, Demonstration, Verbal cues, and Handouts Education comprehension: verbalized understanding and returned demonstration  HOME EXERCISE PROGRAM: Access Code: [email or web address] URL: [email or web address] Date:  10/11/2022 Prepared by: Glenetta Hew  Exercises - Eccentric Heel Lowering on Step  - 1 x daily - 7 x weekly - 2-3 sets - 10 reps  ASSESSMENT:  CLINICAL IMPRESSION: Kristin Benjamin  is an active 66 y.o. female who was seen today  for physical therapy evaluation and treatment for R posterior tibial tendonitis.   She reports localized pain over right navicular tuberosity, and there is a small palpable mass at this location, possibly a lipoma from pressure.  She demonstrates pes cavus bilaterally, but right navicular drop and ankle pronation on the right, putting pressure on this painful area.  Discussed that the ankle brace may be keeping her in better alignment decreasing pressure, and need for appropriate orthotics to support her arch and decrease pain.  She has good ankle mobility and strength but would benefit from skilled physical therapy for intrinsic muscle strengthening, posterior tibialis strengthening to decrease ankle pain.  We discussed today trial of dry needling to posterior tibialis.  Will also request referral to a PT for customizable orthotics to correct ankle alignment.  OBJECTIVE IMPAIRMENTS: Abnormal gait, difficulty walking, and pain.   ACTIVITY LIMITATIONS: sleeping and locomotion level  PARTICIPATION LIMITATIONS: community activity  PERSONAL FACTORS: 1 comorbidity: R knee OA  are also affecting patient's functional outcome.   REHAB POTENTIAL: Excellent  CLINICAL DECISION MAKING: Stable/uncomplicated  EVALUATION COMPLEXITY: Low   GOALS: Goals reviewed with patient? Yes  SHORT TERM GOALS: Target date: 10/25/2022   Patient will be independent with initial HEP. Baseline: given Goal status: INITIAL  LONG TERM GOALS: Target date: 11/22/2022   Patient will be independent with advanced/ongoing HEP to improve outcomes and carryover.  Baseline: needs progression Goal status: INITIAL  2.  Patient will report at least 75% improvement in R ankle/foot pain to  improve QOL. Baseline:  Goal status: INITIAL  3.  Patient obtain appropriate orthotics to improve ankle biomechanics with gait.  Baseline: no orthotics, pes cavus Goal status: INITIAL  4. Patient will be able to participate in sports without increased R ankle pain.  Baseline: increased pain  Goal status: INITIAL  5.  Patient will report 9 points improvement on LEFS to demonstrate improved functional ability. Baseline: 62/80 Goal status: INITIAL  6.  Patient will be able to walk 2 miles without increased R ankle pain. Baseline: unable Goal status: INITIAL   PLAN:  PT FREQUENCY: 1-2x/week  PT DURATION: 6 weeks  PLANNED INTERVENTIONS: Therapeutic exercises, Therapeutic activity, Neuromuscular re-education, Balance training, Gait training, Patient/Family education, Self Care, Joint mobilization, Stair training, Orthotic/Fit training, Dry Needling, Electrical stimulation, Cryotherapy, Moist heat, Taping, Ultrasound, Ionotophoresis '4mg'$ /ml Dexamethasone, Manual therapy, and Re-evaluation  PLAN FOR NEXT SESSION: progress ankle strengthening exercises focusing on posterior and anterior tibialis.  Manual therapy/modalities PRN.  Trial iontophoresis.    Rennie Natter, PT, DPT  10/11/2022, 10:40 AM

## 2022-10-18 ENCOUNTER — Ambulatory Visit: Payer: 59 | Attending: Orthopaedic Surgery

## 2022-10-18 DIAGNOSIS — M25571 Pain in right ankle and joints of right foot: Secondary | ICD-10-CM | POA: Insufficient documentation

## 2022-10-18 NOTE — Therapy (Signed)
OUTPATIENT PHYSICAL THERAPY TREATMENT   Patient Name: Kristin Benjamin MRN: [id number] DOB:December 30, 1955, 66 y.o., female Today's Date: 10/18/2022  END OF SESSION:  PT End of Session - 10/18/22 1111     Visit Number 2    Number of Visits 12    Date for PT Re-Evaluation 11/22/22    Authorization Type [employer]et[employer]    PT Start Time 1015    PT Stop Time 1100    PT Time Calculation (min) 45 min    Activity Tolerance Patient tolerated treatment well    Behavior During Therapy [facility] for tasks assessed/performed              Past Medical History:  Diagnosis Date   Allergy    Anxiety    Arthritis    Depression    Headache    history of   Heart murmur    in the past   Hx of adenomatous colonic polyps 07/17/2017   Hyperlipidemia    Hypertension    Past Surgical History:  Procedure Laterality Date   bladder tack     COLONOSCOPY  2006   and 2018   FUNCTIONAL ENDOSCOPIC SINUS SURGERY     JOINT REPLACEMENT     Left total knee arthroplasty Dr. Ninfa Linden 10-18-18   KNEE ARTHROSCOPY W/ MENISCAL REPAIR Right 08/2016   TOTAL KNEE ARTHROPLASTY Left 10/18/2018   Procedure: LEFT TOTAL KNEE ARTHROPLASTY;  Surgeon: Mcarthur Rossetti, MD;  Location: [facility];  Service: Orthopedics;  Laterality: Left;   TUBAL LIGATION  1993   Patient Active Problem List   Diagnosis Date Noted   Attention deficit hyperactivity disorder (ADHD) 05/18/2022   Mixed hyperlipidemia 04/27/2022   Primary hypertension 04/27/2022   Anxiety and depression 04/27/2022   Pes anserinus bursitis of right knee 02/16/2020   Unilateral primary osteoarthritis, left knee 10/18/2018   Status post total left knee replacement 10/18/2018   Unilateral primary osteoarthritis, right knee 03/28/2018   Chronic pain of right knee 03/28/2018   Hx of adenomatous colonic polyps 07/17/2017    PCP: Terrilyn Saver, NP  REFERRING PROVIDER: Mcarthur Rossetti, MD  REFERRING DIAG: [phone] (ICD-10-CM) - Pain in right ankle and joints  of right foot  THERAPY DIAG:  Pain in right ankle and joints of right foot  Rationale for Evaluation and Treatment: Rehabilitation  ONSET DATE: several months  SUBJECTIVE:   SUBJECTIVE STATEMENT: Pt reports feeling less pain since IE.  PERTINENT HISTORY: L TKA, R knee OA PAIN:  Are you having pain? Yes: NPRS scale: 4/10 Pain location: R medial malleolus going toward navicular  Pain description: burning/stinging Aggravating factors: intermittent, can happen at rest, or playing pickleball Relieving factors: ankle brace  PRECAUTIONS: None  WEIGHT BEARING RESTRICTIONS: No  FALLS:  Has patient fallen in last 6 months? 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   PATIENT SURVEYS:  LEFS 62/80 = 77.5%  COGNITION: Overall cognitive status: Within functional limits for tasks assessed  SENSATION: WFL  EDEMA:  none  MUSCLE LENGTH: NT  POSTURE:  R ankle pronation > L ankle pronation in standing.  Pes cavus bil.   PALPATION: No tenderness in R calf.  Tenderness over R navicular tuberosity.   LOWER EXTREMITY MMT:  MMT Right eval  Hip flexion   Hip extension   Hip abduction   Hip adduction   Hip internal rotation   Hip external rotation   Knee flexion   Knee extension   Ankle dorsiflexion 5  Ankle plantarflexion 5  Ankle inversion 5*  Ankle eversion 5   (Blank rows = not tested) *pain with resistance   LOWER  EXTREMITY MMT:  MMT Right eval  Hip flexion   Hip extension   Hip abduction   Hip adduction   Hip internal rotation   Hip external rotation   Knee flexion   Knee extension   Ankle dorsiflexion 20  Ankle plantarflexion 60  Ankle inversion 40  Ankle eversion 20   (Blank rows = not tested)  LOWER EXTREMITY SPECIAL TESTS:  Ankle special tests: Great toe extension test: negative and navicular drop - noted dropped on right 1 cm, no drop on Left.   FUNCTIONAL TESTS:    GAIT: Distance walked: 83' Assistive device utilized: None Level of assistance: Complete Independence Comments: no significant deviation or device.     TODAY'S TREATMENT:                                                                                                                              DATE:  10/18/22 Therapeutic Exercise: to improve strength and mobility.  Demo, verbal and tactile cues throughout for technique.  Nustep [id number] Heel raise on 6' step 2x10 Gastroc heel drop stretch 2x30 sec R ankle EV/IV/PF with RTB x 10 each direction - pain with DF Toe spreads x 10 Standing rocker board ant/post WS x 10 Standing balance on rocker board trying to maintain centered 2x - 2nd trial unable to keep centered d/t muscle fatigue  Manual Therapy: STM to posterior tib tendon Passive stretch to post tib Self Care:  education on need for orthotics to support foot, can try compression socks as well during recreational activies, POC including dry needling, and also given initial HEP for eccentric heel raises on step, to start with double limb support.     PATIENT EDUCATION:  Education details: HEP update Person educated: Patient Education method: Explanation, Demonstration, Verbal cues, and Handouts Education comprehension: verbalized understanding and returned demonstration  HOME EXERCISE PROGRAM: Access Code: [email or web address]: [email or web address] Date: 10/18/2022 Prepared by: BClarene Essex Exercises - Eccentric Heel Lowering on Step  - 1 x daily - 7 x weekly - 2-3 sets - 10 reps - Gastroc Stretch on Step  - 1 x daily - 7 x weekly - 2-3 sets - 30 sec hold - Seated Ankle Inversion with Resistance and Legs Crossed  - 1 x daily - 7 x weekly -  3 sets - 10 reps - Seated Ankle Eversion with Resistance  - 1 x daily - 7 x weekly - 3 sets - 10 reps - Seated Ankle Plantarflexion with Resistance  - 1 x daily - 7 x weekly - 3 sets - 10 reps  ASSESSMENT:  CLINICAL IMPRESSION: Pt showed a good tolerance for the initial progression of exercises. She continues to have pain along the navicular near the posterior tib insertion. We progressed to ankle resistance band exercises and did static balance on rocker board. With the last repetition of balance on rocker board, she was unable to keep it centered d/t muscle fatigue. She had no increased pain with interventions except ankle DF so we discont'd this exercise for now.  OBJECTIVE IMPAIRMENTS: Abnormal gait, difficulty walking, and pain.   ACTIVITY LIMITATIONS: sleeping and locomotion level  PARTICIPATION LIMITATIONS: community activity  PERSONAL FACTORS: 1 comorbidity: R knee OA  are also affecting patient's functional outcome.   REHAB POTENTIAL: Excellent  CLINICAL DECISION MAKING: Stable/uncomplicated  EVALUATION COMPLEXITY: Low   GOALS: Goals reviewed with patient? Yes  SHORT TERM GOALS: Target date: 10/25/2022   Patient will be independent with initial HEP. Baseline: given Goal status: IN PROGRESS  LONG TERM GOALS: Target date: 11/22/2022   Patient will be independent with advanced/ongoing HEP to improve outcomes and carryover.  Baseline: needs progression Goal status: IN PROGRESS  2.  Patient will report at least 75% improvement in R ankle/foot pain to improve QOL. Baseline:  Goal status: IN PROGRESS  3.  Patient obtain appropriate orthotics to improve ankle biomechanics with gait.  Baseline: no orthotics, pes  cavus Goal status: IN PROGRESS  4. Patient will be able to participate in sports without increased R ankle pain.  Baseline: increased pain  Goal status: IN PROGRESS  5.  Patient will report 9 points improvement on LEFS to demonstrate improved functional ability. Baseline: 62/80 Goal status: IN PROGRESS  6.  Patient will be able to walk 2 miles without increased R ankle pain. Baseline: unable Goal status: IN PROGRESS   PLAN:  PT FREQUENCY: 1-2x/week  PT DURATION: 6 weeks  PLANNED INTERVENTIONS: Therapeutic exercises, Therapeutic activity, Neuromuscular re-education, Balance training, Gait training, Patient/Family education, Self Care, Joint mobilization, Stair training, Orthotic/Fit training, Dry Needling, Electrical stimulation, Cryotherapy, Moist heat, Taping, Ultrasound, Ionotophoresis '4mg'$ /ml Dexamethasone, Manual therapy, and Re-evaluation  PLAN FOR NEXT SESSION: progress ankle strengthening exercises focusing on posterior and anterior tibialis.  Manual therapy/modalities PRN.  Trial iontophoresis.    Artist Pais, PTA 10/18/2022, 11:11 AM

## 2022-10-20 ENCOUNTER — Other Ambulatory Visit: Payer: Self-pay | Admitting: Family Medicine

## 2022-10-20 DIAGNOSIS — G4700 Insomnia, unspecified: Secondary | ICD-10-CM

## 2022-10-20 DIAGNOSIS — F419 Anxiety disorder, unspecified: Secondary | ICD-10-CM

## 2022-10-23 MED ORDER — HYDROXYZINE PAMOATE 25 MG PO CAPS: 25.0000 mg | ORAL_CAPSULE | Freq: Three times a day (TID) | ORAL | 0 refills | Status: DC | PRN

## 2022-10-24 ENCOUNTER — Ambulatory Visit: Payer: 59 | Admitting: Physical Therapy

## 2022-10-25 ENCOUNTER — Ambulatory Visit: Payer: 59 | Admitting: Orthopaedic Surgery

## 2022-10-30 ENCOUNTER — Encounter: Payer: Self-pay | Admitting: Orthopaedic Surgery

## 2022-10-31 ENCOUNTER — Encounter: Payer: 59 | Admitting: Physical Therapy

## 2022-11-14 ENCOUNTER — Encounter: Payer: 59 | Admitting: Physical Therapy

## 2022-12-18 ENCOUNTER — Encounter: Payer: Self-pay | Admitting: Family Medicine

## 2022-12-18 ENCOUNTER — Ambulatory Visit: Payer: 59 | Admitting: Family Medicine

## 2022-12-18 VITALS — BP 126/84 | HR 83 | Temp 98.1°F | Resp 16 | Ht 68.0 in | Wt 175.8 lb

## 2022-12-18 DIAGNOSIS — Z79899 Other long term (current) drug therapy: Secondary | ICD-10-CM | POA: Insufficient documentation

## 2022-12-18 DIAGNOSIS — F909 Attention-deficit hyperactivity disorder, unspecified type: Secondary | ICD-10-CM

## 2022-12-18 MED ORDER — AMPHETAMINE-DEXTROAMPHETAMINE 5 MG PO TABS: 5.0000 mg | ORAL_TABLET | Freq: Every day | ORAL | 0 refills | Status: DC

## 2022-12-18 NOTE — Progress Notes (Signed)
   Established Patient Office Visit  Subjective   Patient ID: Kristin Benjamin, female    DOB: [birth year]  Age: 67 y.o. MRN: [id number]  Chief Complaint  Patient presents with   Follow-up    Medication    HPI  Patient is here for 28-monthADHD follow-up for refills. She is due to update her annual contract and UDS. No new concerns today.    ADHD/ADD: - Medications: Adderall 5 mg daily - Compliance: good - Side effects: none - Concerns: none        ROS All review of systems negative except what is listed in the HPI    Objective:     BP 126/84   Pulse 83   Temp 98.1 F (36.7 C)   Resp 16   Ht '5\' 8"'$  (1.727 m)   Wt 175 lb 12.8 oz (79.7 kg)   SpO2 92%   BMI 26.73 kg/m    Physical Exam Vitals reviewed.  Constitutional:      Appearance: Normal appearance.  Cardiovascular:     Rate and Rhythm: Normal rate and regular rhythm.     Pulses: Normal pulses.     Heart sounds: Normal heart sounds.  Pulmonary:     Effort: Pulmonary effort is normal.     Breath sounds: Normal breath sounds.  Skin:    General: Skin is warm and dry.  Neurological:     Mental Status: She is alert and oriented to person, place, and time.  Psychiatric:        Mood and Affect: Mood normal.        Behavior: Behavior normal.        Thought Content: Thought content normal.        Judgment: Judgment normal.      No results found for any visits on 12/18/22.    The 10-year ASCVD risk score (Arnett DK, et al., 2019) is: 14.4%    Assessment & Plan:   Problem List Items Addressed This Visit       Other   Attention deficit hyperactivity disorder (ADHD) - Primary (Chronic)    Stable on Adderall 5 mg every morning. No side effects. PDMP reviewed.  Update contract and UDS today.      Relevant Medications   amphetamine-dextroamphetamine (ADDERALL) 5 MG tablet (Start on 12/26/2022)   amphetamine-dextroamphetamine (ADDERALL) 5 MG tablet (Start on 01/24/2023)    amphetamine-dextroamphetamine (ADDERALL) 5 MG tablet (Start on 02/23/2023)   High risk medication use (Chronic)    Indication for controlled substance: ADHD Medication and dose: Adderall 5 mg daily  # pills per month: 30 Last UDS date: ordered today Controlled Substance Treatment Agreement signed (Y/N): Yes, today Controlled Substance Treatment Agreement last reviewed with patient:  today [place]CC[place]/PDMP reviewed this encounter: Yes          Relevant Orders   DRUG MONITORING, PANEL 8 WITH CONFIRMATION, URINE    Return in about 3 months (around 03/18/2023) for routine follow-up, labs.    TTerrilyn Saver NP

## 2022-12-18 NOTE — Assessment & Plan Note (Signed)
Indication for controlled substance: ADHD Medication and dose: Adderall 5 mg daily  # pills per month: 30 Last UDS date: ordered today Controlled Substance Treatment Agreement signed (Y/N): Yes, today Controlled Substance Treatment Agreement last reviewed with patient:  today N[place]/PDMP reviewed this encounter: Yes

## 2022-12-18 NOTE — Assessment & Plan Note (Signed)
Stable on Adderall 5 mg every morning. No side effects. PDMP reviewed.  Update contract and UDS today.

## 2022-12-21 LAB — DRUG MONITORING, PANEL 8 WITH CONFIRMATION, URINE
6 Acetylmorphine: NEGATIVE ng/mL (ref <10)
Alcohol Metabolites: POSITIVE ng/mL — AB (ref <500)
Amphetamines: NEGATIVE ng/mL (ref <500)
Benzodiazepines: NEGATIVE ng/mL (ref <100)
Buprenorphine, Urine: NEGATIVE ng/mL (ref <5)
Cocaine Metabolite: NEGATIVE ng/mL (ref <150)
Creatinine: 55.4 mg/dL (ref >=20.0)
Ethyl Glucuronide (ETG): >10000 ng/mL — ABNORMAL HIGH (ref <500)
Ethyl Sulfate (ETS): 3067 ng/mL — ABNORMAL HIGH (ref <100)
MDMA: NEGATIVE ng/mL (ref <500)
Marijuana Metabolite: NEGATIVE ng/mL (ref <20)
Marijuana Metabolite: NEGATIVE ng/mL (ref <5)
Opiates: NEGATIVE ng/mL (ref <100)
Oxidant: NEGATIVE ug/mL (ref <200)
Oxycodone: NEGATIVE ng/mL (ref <100)
pH: 7.6 (ref 4.5–9.0)

## 2022-12-21 LAB — DM TEMPLATE

## 2023-01-21 ENCOUNTER — Ambulatory Visit
Admission: RE | Admit: 2023-01-21 | Discharge: 2023-01-21 | Disposition: A | Discharge status: Home / Self Care | Payer: 59 | Source: Ambulatory Visit | Attending: Nurse Practitioner | Admitting: Nurse Practitioner

## 2023-01-21 VITALS — BP 108/72 | HR 91 | Temp 98.4°F | Resp 18

## 2023-01-21 DIAGNOSIS — H1032 Unspecified acute conjunctivitis, left eye: Secondary | ICD-10-CM

## 2023-01-21 MED ORDER — POLYMYXIN B-TRIMETHOPRIM 10000-0.1 UNIT/ML-% OP SOLN: 1.0000 [drp] | OPHTHALMIC | 0 refills | Status: AC

## 2023-01-21 NOTE — ED Provider Notes (Signed)
[facility]    CSN: [id number] Arrival date & time: 01/21/23  0944      History   Chief Complaint Chief Complaint  Patient presents with   Eye Problem    Left eye is itching and runny - Entered by patient    HPI Kristin Benjamin is a 67 y.o. female presents for evaluation of eye drainage.  Patient reports 4 days of left eye irritation, redness, itching, and purulent drainage.  Denies any visual changes, photophobia, foreign body sensation, injury to the eye.  Denies URI or allergy symptoms.  She does wear contacts.  No OTC medications have been used.  No other concerns at this time.   Eye Problem Associated symptoms: discharge and redness     Past Medical History:  Diagnosis Date   Allergy    Anxiety    Arthritis    Depression    Headache    history of   Heart murmur    in the past   Hx of adenomatous colonic polyps 07/17/2017   Hyperlipidemia    Hypertension     Patient Active Problem List   Diagnosis Date Noted   High risk medication use 12/18/2022   Attention deficit hyperactivity disorder (ADHD) 05/18/2022   Mixed hyperlipidemia 04/27/2022   Primary hypertension 04/27/2022   Anxiety and depression 04/27/2022   Pes anserinus bursitis of right knee 02/16/2020   Unilateral primary osteoarthritis, left knee 10/18/2018   Status post total left knee replacement 10/18/2018   Unilateral primary osteoarthritis, right knee 03/28/2018   Chronic pain of right knee 03/28/2018   Hx of adenomatous colonic polyps 07/17/2017    Past Surgical History:  Procedure Laterality Date   bladder tack     COLONOSCOPY  2006   and 2018   FUNCTIONAL ENDOSCOPIC SINUS SURGERY     JOINT REPLACEMENT     Left total knee arthroplasty Dr. Ninfa Linden 10-18-18   KNEE ARTHROSCOPY W/ MENISCAL REPAIR Right 08/2016   TOTAL KNEE ARTHROPLASTY Left 10/18/2018   Procedure: LEFT TOTAL KNEE ARTHROPLASTY;  Surgeon: Mcarthur Rossetti, MD;  Location: [facility];  Service: Orthopedics;   Laterality: Left;   TUBAL LIGATION  1993    OB History   No obstetric history on file.      Home Medications    Prior to Admission medications   Medication Sig Start Date End Date Taking? Authorizing Provider  trimethoprim-polymyxin b (POLYTRIM) ophthalmic solution Place 1 drop into the left eye every 4 (four) hours for 7 days. 01/21/23 01/28/23 Yes Melynda Ripple, NP  amphetamine-dextroamphetamine (ADDERALL) 5 MG tablet Take 1 tablet (5 mg total) by mouth daily. 12/26/22 01/25/23  Terrilyn Saver, NP  amphetamine-dextroamphetamine (ADDERALL) 5 MG tablet Take 1 tablet (5 mg total) by mouth daily. 01/24/23 02/23/23  Terrilyn Saver, NP  amphetamine-dextroamphetamine (ADDERALL) 5 MG tablet Take 1 tablet (5 mg total) by mouth daily. 02/23/23 03/25/23  Terrilyn Saver, NP  atorvastatin (LIPITOR) 40 MG tablet Take 1 tablet (40 mg total) by mouth daily. 10/02/22   Terrilyn Saver, NP  buPROPion (WELLBUTRIN SR) 150 MG 12 hr tablet Take 1 tablet (150 mg total) by mouth 2 (two) times daily. TAKE ONE TABLET BY MOUTH DAILY FOR THE FIRST 3-5 DAYS, THEN INCREASE TO ONE TABLET TWICE DAILY AND CONTINUE FOR 7-12 WEEKS. TAKE EVENING DOSE NO LATER THAN 6PM. STOP SMOKING AFTER THE FIRST 5-7 DAYS OF TREATMENT. 10/09/22   Terrilyn Saver, NP  conjugated estrogens (PREMARIN) vaginal cream Place  1 Applicatorful vaginally 3 (three) times a week. 02/17/22   Terrilyn Saver, NP  COVID-19 mRNA vaccine [phone] (COMIRNATY) syringe Inject into the muscle. 08/31/22   Carlyle Basques, MD  hydrOXYzine (VISTARIL) 25 MG capsule Take 1 capsule (25 mg total) by mouth every 8 (eight) hours as needed (take at bedtime for sleep). 10/23/22   Terrilyn Saver, NP  influenza vac split quadrivalent PF (FLUARIX QUADRIVALENT) 0.5 ML injection Inject into the muscle. 08/31/22   Carlyle Basques, MD  sertraline (ZOLOFT) 50 MG tablet Take 1 tablet (50 mg total) by mouth daily. 08/14/22   Terrilyn Saver, NP  SUMAtriptan (IMITREX) 100 MG tablet sumatriptan 100  mg tablet 02/16/22   Terrilyn Saver, NP  trimethoprim (TRIMPEX) 100 MG tablet Take 1 tablet (100 mg total) by mouth daily as needed (after intercourse). 02/16/22   Terrilyn Saver, NP  valsartan-hydrochlorothiazide (DIOVAN-HCT) 160-12.5 MG tablet Take 1 tablet by mouth daily. 07/31/22   Terrilyn Saver, NP  zaleplon (SONATA) 10 MG capsule TAKE 1-2 CAPSULES AT NIGHT AS NEEDED 04/13/22   Copland, Gay Filler, MD    Family History Family History  Problem Relation Age of Onset   Hyperlipidemia Mother    Arthritis Mother    Depression Father    Hyperlipidemia Father    Diabetes Father    Alcohol abuse Father    Arthritis Father    Arthritis Sister    Hyperlipidemia Sister    Arthritis Sister    Depression Sister    Hyperlipidemia Sister    Hyperlipidemia Maternal Grandmother    Arthritis Maternal Grandmother    Stroke Maternal Grandfather    Hyperlipidemia Maternal Grandfather    Arthritis Maternal Grandfather    Hyperlipidemia Paternal Grandmother    Arthritis Paternal Grandmother    Hyperlipidemia Paternal Grandfather    Arthritis Paternal Grandfather    Colon cancer Neg Hx    Esophageal cancer Neg Hx    Rectal cancer Neg Hx    Stomach cancer Neg Hx    Colon polyps Neg Hx     Social History Social History   Tobacco Use   Smoking status: Every Day    Packs/day: 1.00    Years: 42.00    Total pack years: 42.00    Types: Cigarettes   Smokeless tobacco: Never   Tobacco comments:    Patient is interested in utilizing Wellbutrin and Nicotine patchs again  Vaping Use   Vaping Use: Never used  Substance Use Topics   Alcohol use: Yes    Alcohol/week: 3.0 - 5.0 standard drinks of alcohol    Types: 3 - 5 Glasses of wine per week    Comment: 3-5 wine per week per pt   Drug use: Yes    Types: Marijuana    Comment: occasionally--monthly     Allergies   Patient has no known allergies.   Review of Systems Review of Systems  Eyes:  Positive for discharge and redness.      Physical Exam Triage Vital Signs ED Triage Vitals  Enc Vitals Group     BP 01/21/23 0948 108/72     Pulse Rate 01/21/23 0948 91     Resp 01/21/23 0948 18     Temp 01/21/23 0948 98.4 F (36.9 C)     Temp Source 01/21/23 0948 Oral     SpO2 01/21/23 0948 93 %     Weight --      Height --      Head Circumference --  Peak Flow --      Pain Score 01/21/23 0947 0     Pain Loc --      Pain Edu? --      Excl. in [place]? --    No data found.  Updated Vital Signs BP 108/72 (BP Location: Right Arm)   Pulse 91   Temp 98.4 F (36.9 C) (Oral)   Resp 18   SpO2 93%   Visual Acuity Right Eye Distance:   Left Eye Distance:   Bilateral Distance:    Right Eye Near:   Left Eye Near:    Bilateral Near:     Physical Exam Vitals and nursing note reviewed.  Constitutional:      Appearance: Normal appearance.  HENT:     Head: Normocephalic and atraumatic.  Eyes:     General: Lids are normal.     Conjunctiva/sclera:     Left eye: Left conjunctiva is not injected. No chemosis, exudate or hemorrhage.    Pupils: Pupils are equal, round, and reactive to light.     Comments: Mild watery drainage noted  Cardiovascular:     Rate and Rhythm: Normal rate.  Pulmonary:     Effort: Pulmonary effort is normal.  Skin:    General: Skin is warm and dry.  Neurological:     General: No focal deficit present.     Mental Status: She is alert and oriented to person, place, and time.  Psychiatric:        Mood and Affect: Mood normal.        Behavior: Behavior normal.      UC Treatments / Results  Labs (all labs ordered are listed, but only abnormal results are displayed) Labs Reviewed - No data to display  EKG   Radiology No results found.  Procedures Procedures (including critical care time)  Medications Ordered in UC Medications - No data to display  Initial Impression / Assessment and Plan / UC Course  I have reviewed the triage vital signs and the nursing  notes.  Pertinent labs & imaging results that were available during my care of the patient were reviewed by me and considered in my medical decision making (see chart for details).     Reviewed with patient different types of conjunctivitis.  Given reported purulent drainage will cover with Polytrim.  Patient instructed if no improvement over 2 days she can stop this and start over-the-counter antihistamine eyedrop such as Clear Eyes.  Advised to not wear her contacts until her symptoms resolve Warm compresses to the eye as needed Follow-up with PCP if symptoms do not improve ER precautions reviewed and patient verbalized understanding Final Clinical Impressions(s) / UC Diagnoses   Final diagnoses:  Acute conjunctivitis of left eye, unspecified acute conjunctivitis type     Discharge Instructions      Polytrim eyedrops as prescribed If your symptoms do not improve in 2 days you can stop this and start using over-the-counter antihistamine eyedrop such as Clear Eyes Warm compresses to your eyes as needed Follow-up with your PCP or eye doctor if symptoms do not improve Please go to the ER for any worsening symptoms   ED Prescriptions     Medication Sig Dispense Auth. Provider   trimethoprim-polymyxin b (POLYTRIM) ophthalmic solution Place 1 drop into the left eye every 4 (four) hours for 7 days. 10 mL Melynda Ripple, NP      PDMP not reviewed this encounter.   Melynda Ripple, NP 01/21/23 1002

## 2023-01-21 NOTE — ED Triage Notes (Signed)
Pt presents with c/o left eye pain, pus discharge X 4 days.  States she cannot put in contacts at the moment.

## 2023-01-21 NOTE — Discharge Instructions (Signed)
Polytrim eyedrops as prescribed If your symptoms do not improve in 2 days you can stop this and start using over-the-counter antihistamine eyedrop such as Clear Eyes Warm compresses to your eyes as needed Follow-up with your PCP or eye doctor if symptoms do not improve Please go to the ER for any worsening symptoms

## 2023-01-23 ENCOUNTER — Ambulatory Visit (INDEPENDENT_AMBULATORY_CARE_PROVIDER_SITE_OTHER): Payer: 59

## 2023-01-23 ENCOUNTER — Encounter: Payer: Self-pay | Admitting: Physician Assistant

## 2023-01-23 ENCOUNTER — Ambulatory Visit: Payer: 59 | Admitting: Physician Assistant

## 2023-01-23 DIAGNOSIS — M1711 Unilateral primary osteoarthritis, right knee: Secondary | ICD-10-CM

## 2023-01-23 DIAGNOSIS — M25561 Pain in right knee: Secondary | ICD-10-CM | POA: Diagnosis not present

## 2023-01-23 DIAGNOSIS — G8929 Other chronic pain: Secondary | ICD-10-CM

## 2023-01-23 MED ORDER — MELOXICAM 7.5 MG PO TABS: 7.5000 mg | ORAL_TABLET | Freq: Two times a day (BID) | ORAL | 2 refills | Status: DC

## 2023-01-23 MED ORDER — METHYLPREDNISOLONE ACETATE 40 MG/ML IJ SUSP
40.0000 mg | INTRAMUSCULAR | Status: AC | PRN
  Administered 2023-01-23: 40 mg via INTRA_ARTICULAR

## 2023-01-23 MED ORDER — LIDOCAINE HCL 1 % IJ SOLN
3.0000 mL | INTRAMUSCULAR | Status: AC | PRN
  Administered 2023-01-23: 3 mL

## 2023-01-23 NOTE — Progress Notes (Signed)
   Procedure Note  Patient: Kristin Benjamin             Date of Birth: 04-13-1956           MRN: [id number]             Visit Date: 01/23/2023  HPI: Mrs. Raz comes in today requesting right knee cortisone injection.  Last injection was 04/24/2022.  She states she is done well until about couple months ago and is now having increased pain in her right knee.  She states she is having a hard time playing pickle ball and being active due to the pain in the knee.  She does wear a brace when she is playing activities like pickleball.  She takes Aleve.  She is also tried ice and elevation.  She is thinking about having knee replacement which in November.  History of left total knee replacement which is doing well.  Review of systems: See HPI otherwise negative.  Physical exam: General well-developed well-nourished female no acute distress mood affect appropriate. Right knee: No abnormal warmth erythema or effusion good range of motion.  Tenderness medial joint line.  No instability.  Left knee full range of motion surgical incisions well-healed.  Right knee 2 views: No acute fracture.  Knee is well located.  Bone-on-bone medial compartment.  Mild patellofemoral and lateral compartmental changes.  No bony abnormalities otherwise.  Procedures: Visit Diagnoses:  1. Primary osteoarthritis of right knee     Large Joint Inj: R knee on 01/23/2023 5:11 PM Indications: pain Details: 22 G 1.5 in needle, anterolateral approach  Arthrogram: No  Medications: 3 mL lidocaine 1 %; 40 mg methylPREDNISolone acetate 40 MG/ML Outcome: tolerated well, no immediate complications Procedure, treatment alternatives, risks and benefits explained, specific risks discussed. Consent was given by the patient. Immediately prior to procedure a time out was called to verify the correct patient, procedure, equipment, support staff and site/side marked as required. Patient was prepped and draped in the usual sterile fashion.      Plan: She will follow-up with [place] as needed she understands she can have cortisone injections no more often than every 3 months.  Will see in late August to discuss setting her up for right total knee arthroplasty sometime in November per her wishes.  Questions were encouraged and answered at length.  Will have her stop taking her Aleve as she is not sure this is helping.  She has had no GI upset with this.  She will take no other NSAIDs and we will place her on meloxicam 7.5 mg up to twice daily.

## 2023-02-02 ENCOUNTER — Other Ambulatory Visit: Payer: Self-pay | Admitting: Family Medicine

## 2023-02-02 DIAGNOSIS — F32A Anxiety disorder, unspecified: Secondary | ICD-10-CM

## 2023-02-02 DIAGNOSIS — G4700 Insomnia, unspecified: Secondary | ICD-10-CM

## 2023-02-05 ENCOUNTER — Other Ambulatory Visit: Payer: Self-pay | Admitting: Family Medicine

## 2023-02-05 DIAGNOSIS — N390 Urinary tract infection, site not specified: Secondary | ICD-10-CM

## 2023-02-20 ENCOUNTER — Other Ambulatory Visit: Payer: Self-pay | Admitting: Family Medicine

## 2023-02-20 DIAGNOSIS — I10 Essential (primary) hypertension: Secondary | ICD-10-CM

## 2023-03-18 ENCOUNTER — Other Ambulatory Visit: Payer: Self-pay | Admitting: Acute Care

## 2023-03-18 DIAGNOSIS — Z87891 Personal history of nicotine dependence: Secondary | ICD-10-CM

## 2023-03-18 DIAGNOSIS — Z122 Encounter for screening for malignant neoplasm of respiratory organs: Secondary | ICD-10-CM

## 2023-03-18 DIAGNOSIS — F17210 Nicotine dependence, cigarettes, uncomplicated: Secondary | ICD-10-CM

## 2023-03-19 ENCOUNTER — Ambulatory Visit: Payer: 59 | Admitting: Family Medicine

## 2023-03-19 ENCOUNTER — Encounter: Payer: Self-pay | Admitting: Family Medicine

## 2023-03-19 VITALS — BP 109/75 | HR 77 | Ht 68.0 in | Wt 174.0 lb

## 2023-03-19 DIAGNOSIS — F909 Attention-deficit hyperactivity disorder, unspecified type: Secondary | ICD-10-CM | POA: Diagnosis not present

## 2023-03-19 DIAGNOSIS — F419 Anxiety disorder, unspecified: Secondary | ICD-10-CM | POA: Diagnosis not present

## 2023-03-19 DIAGNOSIS — M1711 Unilateral primary osteoarthritis, right knee: Secondary | ICD-10-CM

## 2023-03-19 DIAGNOSIS — N390 Urinary tract infection, site not specified: Secondary | ICD-10-CM

## 2023-03-19 DIAGNOSIS — F32A Depression, unspecified: Secondary | ICD-10-CM

## 2023-03-19 DIAGNOSIS — I10 Essential (primary) hypertension: Secondary | ICD-10-CM

## 2023-03-19 DIAGNOSIS — Z79899 Other long term (current) drug therapy: Secondary | ICD-10-CM

## 2023-03-19 DIAGNOSIS — E782 Mixed hyperlipidemia: Secondary | ICD-10-CM

## 2023-03-19 MED ORDER — SERTRALINE HCL 50 MG PO TABS: 50.0000 mg | ORAL_TABLET | Freq: Every day | ORAL | 1 refills | Status: DC

## 2023-03-19 MED ORDER — MELOXICAM 7.5 MG PO TABS: 7.5000 mg | ORAL_TABLET | Freq: Two times a day (BID) | ORAL | 1 refills | Status: DC

## 2023-03-19 MED ORDER — AMPHETAMINE-DEXTROAMPHETAMINE 5 MG PO TABS: 5.0000 mg | ORAL_TABLET | Freq: Every day | ORAL | 0 refills | Status: DC

## 2023-03-19 MED ORDER — ESTROGENS CONJUGATED 0.625 MG/GM VA CREA: 1.0000 | TOPICAL_CREAM | VAGINAL | 5 refills | Status: DC

## 2023-03-19 MED ORDER — VALSARTAN-HYDROCHLOROTHIAZIDE 160-12.5 MG PO TABS: 1.0000 | ORAL_TABLET | Freq: Every day | ORAL | 1 refills | Status: DC

## 2023-03-19 NOTE — Patient Instructions (Signed)
Good to see you today! Keeping you and your family in my prayers - please let me know if you need anything!

## 2023-03-19 NOTE — Assessment & Plan Note (Signed)
Stable on Adderall 5 mg every morning. No side effects. PDMP reviewed.  Up-to-date contract and UDS

## 2023-03-19 NOTE — Assessment & Plan Note (Signed)
Refilling meloxicam Continue supportive measures Considering knee replacement in the next year or so

## 2023-03-19 NOTE — Assessment & Plan Note (Signed)
Stable on Zoloft 50 mg and Wellbutrin 150 mg BID; PRN hydroxyzine and Sonata. No SI/HI.

## 2023-03-19 NOTE — Assessment & Plan Note (Signed)
Indication for controlled substance: ADHD Medication and dose: Adderall 5 mg daily  # pills per month: 30 Last UDS date: UTD Controlled Substance Treatment Agreement signed (Y/N): Yes Controlled Substance Treatment Agreement last reviewed with patient:  today NCCS[place]/PDMP reviewed this encounter: Yes

## 2023-03-19 NOTE — Assessment & Plan Note (Signed)
Blood pressure is at goal for age and co-morbidities.   Recommendations: valsartan-HCTZ 160-12.5 mg daily - BP goal <130/80 - monitor and log blood pressures at home - check around the same time each day in a relaxed setting - Limit salt to <2000 mg/day - Follow DASH eating plan (heart healthy diet) - limit alcohol to 2 standard drinks per day for men and 1 per day for women - avoid tobacco products - get at least 2 hours of regular aerobic exercise weekly Patient aware of signs/symptoms requiring further/urgent evaluation. Labs declined today

## 2023-03-19 NOTE — Progress Notes (Signed)
Established Patient Office Visit  Subjective   Patient ID: Kristin Benjamin, female    DOB: 18-Jun-1956  Age: 67 y.o. MRN: [id number]  Chief Complaint  Patient presents with   Medical Management of Chronic Issues   ADHD    HPI  Patient is here for chronic disease management. No new concerns today. Her mom recently passed and she is grieving, but doing well overall. She is getting counseling through hospice. She is excited to be visiting her grandkids in [place] next month.    ADHD/ADD: - Medications: Adderall 5 mg daily - Compliance: good - Side effects: none - Concerns: none   Hyperlipidemia: - medications: atorvastatin 40 mg daily - compliance: good - medication SEs: none The 10-year ASCVD risk score (Arnett DK, et al., 2019) is: 11%   Values used to calculate the score:     Age: 77 years     Sex: Female     Is Non-Hispanic African American: No     Diabetic: No     Tobacco smoker: Yes     Systolic Blood Pressure: 109 mmHg     Is BP treated: Yes     HDL Cholesterol: 42.8 mg/dL     Total Cholesterol: 225 mg/dL   Hypertension: - Medications: valsartan-hctz 160-12.5 mg daily - Compliance: good - Checking BP at home: occasionally - Denies any SOB, recurrent headaches, CP, vision changes, LE edema, dizziness, palpitations, or medication side effects. - Diet: heart healthy - Exercise: walking, pickelball multiple times per week   Mood follow-up: - Diagnosis: anxiety, depression, insomnia - Treatment: Wellbutrin 150 mg BID, Zoloft 50 mg daily, Sonata 10-20 mg nightly PRN, hydroxyzine 25 mg TID PRN - Medication side effects:  - SI/HI:  - Update:  Right knee osteoarthritis: - She saw ortho and has been getting good relief with meloxicam- requesting refill.             ROS All review of systems negative except what is listed in the HPI    Objective:     BP 109/75   Pulse 77   Ht 5\' 8"  (1.727 m)   Wt 174 lb (78.9 kg)   SpO2 97%   BMI 26.46  kg/m    Physical Exam Vitals reviewed.  Constitutional:      Appearance: Normal appearance.  Cardiovascular:     Rate and Rhythm: Normal rate and regular rhythm.     Pulses: Normal pulses.     Heart sounds: Normal heart sounds.  Pulmonary:     Effort: Pulmonary effort is normal.     Breath sounds: Normal breath sounds.  Skin:    General: Skin is warm and dry.  Neurological:     Mental Status: She is alert and oriented to person, place, and time.  Psychiatric:        Mood and Affect: Mood normal.        Behavior: Behavior normal.        Thought Content: Thought content normal.        Judgment: Judgment normal.      No results found for any visits on 03/19/23.    The 10-year ASCVD risk score (Arnett DK, et al., 2019) is: 11%    Assessment & Plan:   Problem List Items Addressed This Visit     Unilateral primary osteoarthritis, right knee - Primary    Refilling meloxicam Continue supportive measures Considering knee replacement in the next year or so      Relevant Medications  meloxicam (MOBIC) 7.5 MG tablet   Mixed hyperlipidemia (Chronic)    -Reviewed most recent lipid panel -Medication management: continue Lipitor 40 mg daily -Diet low in saturated fat -Regular exercise - at least 30 minutes, 5 times per week -Declined labs today        Relevant Medications   valsartan-hydrochlorothiazide (DIOVAN-HCT) 160-12.5 MG tablet   Primary hypertension (Chronic)    Blood pressure is at goal for age and co-morbidities.   Recommendations: valsartan-HCTZ 160-12.5 mg daily - BP goal <130/80 - monitor and log blood pressures at home - check around the same time each day in a relaxed setting - Limit salt to <2000 mg/day - Follow DASH eating plan (heart healthy diet) - limit alcohol to 2 standard drinks per day for men and 1 per day for women - avoid tobacco products - get at least 2 hours of regular aerobic exercise weekly Patient aware of signs/symptoms requiring  further/urgent evaluation. Labs declined today       Relevant Medications   valsartan-hydrochlorothiazide (DIOVAN-HCT) 160-12.5 MG tablet   Anxiety and depression (Chronic)    Stable on Zoloft 50 mg and Wellbutrin 150 mg BID; PRN hydroxyzine and Sonata. No SI/HI.       Relevant Medications   clonazePAM (KLONOPIN) 1 MG tablet   sertraline (ZOLOFT) 50 MG tablet   Attention deficit hyperactivity disorder (ADHD) (Chronic)    Stable on Adderall 5 mg every morning. No side effects. PDMP reviewed.  Up-to-date contract and UDS       Relevant Medications   amphetamine-dextroamphetamine (ADDERALL) 5 MG tablet (Start on 05/17/2023)   amphetamine-dextroamphetamine (ADDERALL) 5 MG tablet (Start on 04/18/2023)   amphetamine-dextroamphetamine (ADDERALL) 5 MG tablet   High risk medication use (Chronic)    Indication for controlled substance: ADHD Medication and dose: Adderall 5 mg daily  # pills per month: 30 Last UDS date: UTD Controlled Substance Treatment Agreement signed (Y/N): Yes Controlled Substance Treatment Agreement last reviewed with patient:  today NCCSRS/PDMP reviewed this encounter: Yes          Other Visit Diagnoses     Frequent UTI       Relevant Medications   conjugated estrogens (PREMARIN) vaginal cream      Return in about 3 months (around 06/19/2023) for Adderall f/u.    Clayborne Dana, NP

## 2023-03-19 NOTE — Assessment & Plan Note (Signed)
-  Reviewed most recent lipid panel -Medication management: continue Lipitor 40 mg daily -Diet low in saturated fat -Regular exercise - at least 30 minutes, 5 times per week -Declined labs today

## 2023-04-13 ENCOUNTER — Ambulatory Visit (HOSPITAL_BASED_OUTPATIENT_CLINIC_OR_DEPARTMENT_OTHER): Payer: 59

## 2023-04-13 ENCOUNTER — Encounter (HOSPITAL_BASED_OUTPATIENT_CLINIC_OR_DEPARTMENT_OTHER): Payer: Self-pay

## 2023-04-13 ENCOUNTER — Encounter: Payer: Self-pay | Admitting: Family Medicine

## 2023-04-13 ENCOUNTER — Other Ambulatory Visit: Payer: Self-pay | Admitting: Family

## 2023-04-13 MED ORDER — ESTRADIOL 0.1 MG/GM VA CREA: 1.0000 | TOPICAL_CREAM | Freq: Every day | VAGINAL | 12 refills | Status: DC

## 2023-04-13 NOTE — Telephone Encounter (Signed)
Patient reports the insurance prefers this estrace over premarin please advise

## 2023-04-17 ENCOUNTER — Ambulatory Visit: Payer: 59 | Admitting: Family

## 2023-04-17 VITALS — BP 111/73 | HR 92 | Temp 99.0°F | Resp 16 | Wt 170.0 lb

## 2023-04-17 DIAGNOSIS — N3000 Acute cystitis without hematuria: Secondary | ICD-10-CM | POA: Diagnosis not present

## 2023-04-17 DIAGNOSIS — R350 Frequency of micturition: Secondary | ICD-10-CM

## 2023-04-17 LAB — POC URINALSYSI DIPSTICK (AUTOMATED)
Bilirubin, UA: NEGATIVE
Blood, UA: NEGATIVE
Glucose, UA: NEGATIVE
Ketones, UA: NEGATIVE
Nitrite, UA: POSITIVE
Protein, UA: NEGATIVE
Spec Grav, UA: 1.02 (ref 1.010–1.025)
Urobilinogen, UA: 0.2 E.U./dL
pH, UA: 5 (ref 5.0–8.0)

## 2023-04-17 MED ORDER — CEPHALEXIN 500 MG PO CAPS: 500.0000 mg | ORAL_CAPSULE | Freq: Two times a day (BID) | ORAL | 0 refills | Status: DC

## 2023-04-17 NOTE — Assessment & Plan Note (Signed)
Urinalysis notes some leuks.  Will begin keflex while we wait on urine culture. Pt is advised to call if symptoms worsen or if symptoms fail to improve.

## 2023-04-17 NOTE — Progress Notes (Signed)
Subjective:     Patient ID: Kristin Benjamin, female    DOB: November 16, 1955, 67 y.o.   MRN: [id number]  Chief Complaint  Patient presents with   Urinary Frequency    Patient complains of frequent urination with cloudy and smelly urine    Urinary Frequency  Associated symptoms include frequency.    History of Present Illness         Patient notes intermittent urinary odor, frequency, nocturia, as well as some hesitency. No fever no hematuria. She has previous hx hx of UTI's and was out of estrogen cream briefly.       Health Maintenance Due  Topic Date Due   HIV Screening  Never done   Hepatitis C Screening  Never done   Lung Cancer Screening  05/02/2023    Past Medical History:  Diagnosis Date   Allergy    Anxiety    Arthritis    Depression    Headache    history of   Heart murmur    in the past   Hx of adenomatous colonic polyps 07/17/2017   Hyperlipidemia    Hypertension     Past Surgical History:  Procedure Laterality Date   bladder tack     COLONOSCOPY  2006   and 2018   FUNCTIONAL ENDOSCOPIC SINUS SURGERY     JOINT REPLACEMENT     Left total knee arthroplasty Dr. Magnus Ivan 10-18-18   KNEE ARTHROSCOPY W/ MENISCAL REPAIR Right 08/2016   TOTAL KNEE ARTHROPLASTY Left 10/18/2018   Procedure: LEFT TOTAL KNEE ARTHROPLASTY;  Surgeon: Kathryne Hitch, MD;  Location: [facility];  Service: Orthopedics;  Laterality: Left;   TUBAL LIGATION  1993    Family History  Problem Relation Age of Onset   Hyperlipidemia Mother    Arthritis Mother    Depression Father    Hyperlipidemia Father    Diabetes Father    Alcohol abuse Father    Arthritis Father    Arthritis Sister    Hyperlipidemia Sister    Arthritis Sister    Depression Sister    Hyperlipidemia Sister    Hyperlipidemia Maternal Grandmother    Arthritis Maternal Grandmother    Stroke Maternal Grandfather    Hyperlipidemia Maternal Grandfather    Arthritis Maternal Grandfather    Hyperlipidemia  Paternal Grandmother    Arthritis Paternal Grandmother    Hyperlipidemia Paternal Grandfather    Arthritis Paternal Grandfather    Colon cancer Neg Hx    Esophageal cancer Neg Hx    Rectal cancer Neg Hx    Stomach cancer Neg Hx    Colon polyps Neg Hx     Social History   Socioeconomic History   Marital status: Married    Spouse name: Not on file   Number of children: Not on file   Years of education: Not on file   Highest education level: Not on file  Occupational History   Occupation: retired  Tobacco Use   Smoking status: Every Day    Packs/day: 1.00    Years: 42.00    Additional pack years: 0.00    Total pack years: 42.00    Types: Cigarettes   Smokeless tobacco: Never   Tobacco comments:    Patient is interested in utilizing Wellbutrin and Nicotine patchs again  Vaping Use   Vaping Use: Never used  Substance and Sexual Activity   Alcohol use: Yes    Alcohol/week: 3.0 - 5.0 standard drinks of alcohol    Types: 3 -  5 Glasses of wine per week    Comment: 3-5 wine per week per pt   Drug use: Yes    Types: Marijuana    Comment: occasionally--monthly   Sexual activity: Yes  Other Topics Concern   Not on file  Social History Narrative   Not on file   Social Determinants of Health   Financial Resource Strain: Patient Declined (03/12/2023)   Overall Financial Resource Strain (CARDIA)    Difficulty of Paying Living Expenses: Patient declined  Food Insecurity: No Food Insecurity (03/12/2023)   Hunger Vital Sign    Worried About Running Out of Food in the Last Year: Never true    Ran Out of Food in the Last Year: Never true  Transportation Needs: Unknown (03/12/2023)   PRAPARE - Administrator, Civil Service (Medical): Patient declined    Lack of Transportation (Non-Medical): No  Physical Activity: Sufficiently Active (03/12/2023)   Exercise Vital Sign    Days of Exercise per Week: 7 days    Minutes of Exercise per Session: 120 min  Stress: Stress  Concern Present (03/12/2023)   [employer] of Occupational Health - Occupational Stress Questionnaire    Feeling of Stress : To some extent  Social Connections: Unknown (03/12/2023)   Social Connection and Isolation Panel [NHANES]    Frequency of Communication with Friends and Family: More than three times a week    Frequency of Social Gatherings with Friends and Family: More than three times a week    Attends Religious Services: Patient declined    Database administrator or Organizations: Yes    Attends Engineer, structural: More than 4 times per year    Marital Status: Married  Catering manager Violence: Not on file    Outpatient Medications Prior to Visit  Medication Sig Dispense Refill   [START ON 05/17/2023] amphetamine-dextroamphetamine (ADDERALL) 5 MG tablet Take 1 tablet (5 mg total) by mouth daily. 30 tablet 0   [START ON 04/18/2023] amphetamine-dextroamphetamine (ADDERALL) 5 MG tablet Take 1 tablet (5 mg total) by mouth daily. 30 tablet 0   amphetamine-dextroamphetamine (ADDERALL) 5 MG tablet Take 1 tablet (5 mg total) by mouth daily. 30 tablet 0   atorvastatin (LIPITOR) 40 MG tablet Take 1 tablet (40 mg total) by mouth daily. 90 tablet 1   buPROPion (WELLBUTRIN SR) 150 MG 12 hr tablet Take 1 tablet (150 mg total) by mouth 2 (two) times daily. TAKE ONE TABLET BY MOUTH DAILY FOR THE FIRST 3-5 DAYS, THEN INCREASE TO ONE TABLET TWICE DAILY AND CONTINUE FOR 7-12 WEEKS. TAKE EVENING DOSE NO LATER THAN 6PM. STOP SMOKING AFTER THE FIRST 5-7 DAYS OF TREATMENT. 180 tablet 0   clonazePAM (KLONOPIN) 1 MG tablet Take 1 mg by mouth.     estradiol (ESTRACE VAGINAL) 0.1 MG/GM vaginal cream Place 1 Applicatorful vaginally at bedtime. 42.5 g 12   hydrOXYzine (VISTARIL) 25 MG capsule TAKE ONE CAPSULE BY MOUTH EVERY 8 HOURS AS NEEDED (TAKE AT BEDTIME FOR SLEEP) AS DIRECTED 30 capsule 0   influenza vac split quadrivalent PF (FLUARIX QUADRIVALENT) 0.5 ML injection Inject into the muscle. 0.5 mL  0   meloxicam (MOBIC) 7.5 MG tablet Take 1 tablet (7.5 mg total) by mouth 2 (two) times daily. 180 tablet 1   sertraline (ZOLOFT) 50 MG tablet Take 1 tablet (50 mg total) by mouth daily. 90 tablet 1   SUMAtriptan (IMITREX) 100 MG tablet sumatriptan 100 mg tablet 10 tablet 0   trimethoprim (TRIMPEX) 100  MG tablet TAKE ONE TABLET BY MOUTH DAILY AS NEEDED AFTER INTERCOURSE 20 tablet 1   valsartan-hydrochlorothiazide (DIOVAN-HCT) 160-12.5 MG tablet Take 1 tablet by mouth daily. 90 tablet 1   zaleplon (SONATA) 10 MG capsule TAKE 1-2 CAPSULES AT NIGHT AS NEEDED 30 capsule 0   No facility-administered medications prior to visit.    No Known Allergies  Review of Systems  Genitourinary:  Positive for frequency.   See HPI    Objective:    Physical Exam Constitutional:      General: She is not in acute distress.    Appearance: Normal appearance. She is well-developed.  HENT:     Head: Normocephalic and atraumatic.     Right Ear: External ear normal.     Left Ear: External ear normal.  Eyes:     General: No scleral icterus. Neck:     Thyroid: No thyromegaly.  Cardiovascular:     Rate and Rhythm: Normal rate and regular rhythm.     Heart sounds: Normal heart sounds. No murmur heard. Pulmonary:     Effort: Pulmonary effort is normal. No respiratory distress.     Breath sounds: Normal breath sounds. No wheezing.  Abdominal:     General: Bowel sounds are normal.     Tenderness: There is no abdominal tenderness. There is no right CVA tenderness or left CVA tenderness.  Musculoskeletal:     Cervical back: Neck supple.  Skin:    General: Skin is warm and dry.  Neurological:     Mental Status: She is alert and oriented to person, place, and time.  Psychiatric:        Mood and Affect: Mood normal.        Behavior: Behavior normal.        Thought Content: Thought content normal.        Judgment: Judgment normal.      BP 111/73 (BP Location: Right Arm, Patient Position: Sitting, Cuff  Size: Small)   Pulse 92   Temp 99 F (37.2 C) (Oral)   Resp 16   Wt 170 lb (77.1 kg)   SpO2 95%   BMI 25.85 kg/m  Wt Readings from Last 3 Encounters:  04/17/23 170 lb (77.1 kg)  03/19/23 174 lb (78.9 kg)  12/18/22 175 lb 12.8 oz (79.7 kg)       Assessment & Plan:   Problem List Items Addressed This Visit       Unprioritized   Acute cystitis without hematuria    Urinalysis notes some leuks.  Will begin keflex while we wait on urine culture. Pt is advised to call if symptoms worsen or if symptoms fail to improve.       Relevant Medications   cephALEXin (KEFLEX) 500 MG capsule   Other Visit Diagnoses     Frequent urination    -  Primary   Relevant Orders   POCT Urinalysis Dipstick (Automated) (Completed)       I am having Kristin Benjamin "[name]" start on cephALEXin. I am also having her maintain her SUMAtriptan, zaleplon, Fluarix Quadrivalent, atorvastatin, buPROPion, hydrOXYzine, trimethoprim, clonazePAM, amphetamine-dextroamphetamine, amphetamine-dextroamphetamine, amphetamine-dextroamphetamine, meloxicam, sertraline, valsartan-hydrochlorothiazide, and estradiol.  Meds ordered this encounter  Medications   cephALEXin (KEFLEX) 500 MG capsule    Sig: Take 1 capsule (500 mg total) by mouth 2 (two) times daily for 5 days.    Dispense:  10 capsule    Refill:  0    Order Specific Question:   Supervising Provider    Answer:  BLYTH, STACEY A [[id number]]

## 2023-04-19 LAB — URINE CULTURE
MICRO NUMBER:: 15038884
SPECIMEN QUALITY:: ADEQUATE

## 2023-04-20 ENCOUNTER — Telehealth: Payer: Self-pay | Admitting: Family

## 2023-04-20 MED ORDER — NITROFURANTOIN MONOHYD MACRO 100 MG PO CAPS: 100.0000 mg | ORAL_CAPSULE | Freq: Two times a day (BID) | ORAL | 0 refills | Status: AC

## 2023-04-20 NOTE — Telephone Encounter (Signed)
Late entry-spoke with pt last night and advised her to change antibiotic to macrobid.  Rx called into her pharmacy.

## 2023-05-03 ENCOUNTER — Other Ambulatory Visit: Payer: 59

## 2023-05-18 ENCOUNTER — Ambulatory Visit
Admission: RE | Admit: 2023-05-18 | Discharge: 2023-05-18 | Disposition: A | Discharge status: Home / Self Care | Payer: 59 | Source: Ambulatory Visit | Attending: Acute Care | Admitting: Acute Care

## 2023-05-18 DIAGNOSIS — Z122 Encounter for screening for malignant neoplasm of respiratory organs: Secondary | ICD-10-CM

## 2023-05-18 DIAGNOSIS — Z87891 Personal history of nicotine dependence: Secondary | ICD-10-CM

## 2023-05-18 DIAGNOSIS — F17210 Nicotine dependence, cigarettes, uncomplicated: Secondary | ICD-10-CM

## 2023-05-21 ENCOUNTER — Ambulatory Visit: Payer: 59 | Admitting: Family Medicine

## 2023-05-21 ENCOUNTER — Encounter: Payer: Self-pay | Admitting: Family Medicine

## 2023-05-21 VITALS — BP 116/72 | HR 93 | Temp 98.4°F | Resp 18 | Ht 68.0 in | Wt 172.0 lb

## 2023-05-21 DIAGNOSIS — I700 Atherosclerosis of aorta: Secondary | ICD-10-CM

## 2023-05-21 DIAGNOSIS — R051 Acute cough: Secondary | ICD-10-CM

## 2023-05-21 DIAGNOSIS — J0140 Acute pansinusitis, unspecified: Secondary | ICD-10-CM | POA: Diagnosis not present

## 2023-05-21 DIAGNOSIS — I2510 Atherosclerotic heart disease of native coronary artery without angina pectoris: Secondary | ICD-10-CM | POA: Diagnosis not present

## 2023-05-21 MED ORDER — AMOXICILLIN-POT CLAVULANATE 875-125 MG PO TABS: 1.0000 | ORAL_TABLET | Freq: Two times a day (BID) | ORAL | 0 refills | Status: DC

## 2023-05-21 MED ORDER — HYDROCOD POLI-CHLORPHE POLI ER 10-8 MG/5ML PO SUER: 5.0000 mL | Freq: Two times a day (BID) | ORAL | 0 refills | Status: AC | PRN

## 2023-05-21 NOTE — Assessment & Plan Note (Addendum)
On Lipitor. Recent CT lung scan noted atherosclerosis and two-vessel coronary artery disease -Continue Lipitor. -Consider referral to cardiology for further eval and possible coronary calcium study in November per patient request - wants to wait until she has Medicare. Discussed symptoms to monitor for.

## 2023-05-21 NOTE — Progress Notes (Signed)
Acute Office Visit  Subjective:     Patient ID: Kristin Benjamin, female    DOB: [birth year], 67 y.o.   MRN: [id number]  Chief Complaint  Patient presents with   ongoing cough, congestion    Pt says she went to [place] and got sick for about 2 weeks. She is stuck with some congestion. Taking Nyquil since sxs are worse at night. Also CT lung results are in this AM.    HPI Patient is in today for cough, URI.   Discussed the use of AI scribe software for clinical note transcription with the patient, who gave verbal consent to proceed.  History of Present Illness   The patient, with a cough that began after a recent cold. The cough is severe, productive of yellow phlegm, and has been disturbing their sleep. They report having to prop up to sleep and have been experiencing what they believe to be intermittent low-grade fevers. They deny hemoptysis and dyspnea. She still has some rhinorrhea and postnasal drainage with mild sinus discomfort.   Recent low-dose lung CT was negative for screening purposes, but did reveal some coronary artery disease. Patient prefers to wait until after November (getting Medicare) to further evaluate with cardiology. She is continuing to follow a heart-healthy lifestyle and taking Lipitor. Discussed signs and symptoms to monitor for.              ROS All review of systems negative except what is listed in the HPI      Objective:    BP 116/72 (BP Location: Left Arm, Patient Position: Sitting, Cuff Size: Normal)   Pulse 93   Temp 98.4 F (36.9 C) (Oral)   Resp 18   Ht 5\' 8"  (1.727 m)   Wt 172 lb (78 kg)   SpO2 96%   BMI 26.15 kg/m    Physical Exam Vitals reviewed.  Constitutional:      Appearance: Normal appearance.  Cardiovascular:     Rate and Rhythm: Normal rate and regular rhythm.     Pulses: Normal pulses.     Heart sounds: Normal heart sounds.  Pulmonary:     Effort: Pulmonary effort is normal.     Breath sounds: Normal breath  sounds. No wheezing, rhonchi or rales.  Skin:    General: Skin is warm and dry.  Neurological:     Mental Status: She is alert and oriented to person, place, and time.  Psychiatric:        Mood and Affect: Mood normal.        Behavior: Behavior normal.        Thought Content: Thought content normal.        Judgment: Judgment normal.     No results found for any visits on 05/21/23.      Assessment & Plan:   Problem List Items Addressed This Visit     Coronary artery disease involving native coronary artery of native heart without angina pectoris    On Lipitor. Recent CT lung scan noted atherosclerosis and two-vessel coronary artery disease -Continue Lipitor. -Consider referral to cardiology for further eval and possible coronary calcium study in November per patient request - wants to wait until she has Medicare. Discussed symptoms to monitor for.       Aortic atherosclerosis (HCC)    On Lipitor. Recent CT lung scan noted atherosclerosis and two-vessel coronary artery disease -Continue Lipitor. .       Other Visit Diagnoses     Acute non-recurrent pansinusitis    -  Primary   Relevant Medications   amoxicillin-clavulanate (AUGMENTIN) 875-125 MG tablet   chlorpheniramine-HYDROcodone (TUSSIONEX) 10-8 MG/5ML   Acute cough       Relevant Medications   amoxicillin-clavulanate (AUGMENTIN) 875-125 MG tablet   chlorpheniramine-HYDROcodone (TUSSIONEX) 10-8 MG/5ML      Persistent cough with yellow phlegm for two weeks following a cold. No fever, dyspnea, or hemoptysis. Recent negative CT lung scan. -Prescribe Augmentin. -Advise to continue supportive care with hot tea, fluids, and Mucinex. -Prescribe Tussionex for symptomatic relief of cough, especially at night.  Meds ordered this encounter  Medications   amoxicillin-clavulanate (AUGMENTIN) 875-125 MG tablet    Sig: Take 1 tablet by mouth 2 (two) times daily.    Dispense:  20 tablet    Refill:  0    Order Specific  Question:   Supervising Provider    Answer:   Danise Edge A [[id number]]   chlorpheniramine-HYDROcodone (TUSSIONEX) 10-8 MG/5ML    Sig: Take 5 mLs by mouth every 12 (twelve) hours as needed for up to 5 days.    Dispense:  50 mL    Refill:  0    Order Specific Question:   Supervising Provider    Answer:   Danise Edge A [[id number]]    Return if symptoms worsen or fail to improve.  Clayborne Dana, NP

## 2023-05-21 NOTE — Assessment & Plan Note (Signed)
On Lipitor. Recent CT lung scan noted atherosclerosis and two-vessel coronary artery disease -Continue Lipitor. Marland Kitchen

## 2023-05-22 ENCOUNTER — Encounter: Payer: Self-pay | Admitting: Physician Assistant

## 2023-05-22 ENCOUNTER — Ambulatory Visit: Payer: 59 | Admitting: Physician Assistant

## 2023-05-22 ENCOUNTER — Other Ambulatory Visit: Payer: Self-pay | Admitting: Acute Care

## 2023-05-22 DIAGNOSIS — F17210 Nicotine dependence, cigarettes, uncomplicated: Secondary | ICD-10-CM

## 2023-05-22 DIAGNOSIS — M1711 Unilateral primary osteoarthritis, right knee: Secondary | ICD-10-CM

## 2023-05-22 DIAGNOSIS — Z122 Encounter for screening for malignant neoplasm of respiratory organs: Secondary | ICD-10-CM

## 2023-05-22 DIAGNOSIS — Z87891 Personal history of nicotine dependence: Secondary | ICD-10-CM

## 2023-05-22 MED ORDER — METHYLPREDNISOLONE ACETATE 40 MG/ML IJ SUSP
40.0000 mg | INTRAMUSCULAR | Status: AC | PRN
  Administered 2023-05-22: 40 mg via INTRA_ARTICULAR

## 2023-05-22 MED ORDER — LIDOCAINE HCL 1 % IJ SOLN
3.0000 mL | INTRAMUSCULAR | Status: AC | PRN
  Administered 2023-05-22: 3 mL

## 2023-05-22 NOTE — Progress Notes (Signed)
   Procedure Note  Patient: Kristin Benjamin             Date of Birth: Mar 14, 1956           MRN: [id number]             Visit Date: 05/22/2023 HPI: Mrs. Kristin Benjamin returns today right knee pain.  She has known osteoarthritis of her right knee.  States the last injection on 01/23/2023 helped however pain returned a month ago.  Knee is now buckling.  She is unable to play pickle ball due to the pain.  She is taking Tylenol extra strength As well as meloxicam has helped some.  She is also bracing the knee knee brace.  No new injury to the knee.  Physical exam: General well-developed well-nourished female no acute distress mood affect appropriate.  Ambulates with a slight antalgic gait no assistive device. Right knee full extension full flexion.  No abnormal warmth erythema.  Significant patellofemoral crepitus with passive range of motion left right knee.  Procedures: Visit Diagnoses:  1. Primary osteoarthritis of right knee     Large Joint Inj on 05/22/2023 8:42 AM Indications: pain Details: 22 G 1.5 in needle, anterolateral approach  Arthrogram: No  Medications: 3 mL lidocaine 1 %; 40 mg methylPREDNISolone acetate 40 MG/ML Outcome: tolerated well, no immediate complications Procedure, treatment alternatives, risks and benefits explained, specific risks discussed. Consent was given by the patient. Immediately prior to procedure a time out was called to verify the correct patient, procedure, equipment, support staff and site/side marked as required. Patient was prepped and draped in the usual sterile fashion.    Plan: Will see her back in early November to most likely get her on the schedule for a right total knee arthroplasty.  She tolerated the cortisone injection well today.  Questions were encouraged and answered at length.

## 2023-05-28 ENCOUNTER — Other Ambulatory Visit: Payer: Self-pay | Admitting: Family Medicine

## 2023-06-21 ENCOUNTER — Ambulatory Visit: Payer: 59 | Admitting: Family Medicine

## 2023-06-25 ENCOUNTER — Other Ambulatory Visit: Payer: Self-pay | Admitting: Family Medicine

## 2023-06-25 ENCOUNTER — Encounter: Payer: Self-pay | Admitting: Family Medicine

## 2023-06-25 ENCOUNTER — Ambulatory Visit: Payer: 59 | Admitting: Family Medicine

## 2023-06-25 DIAGNOSIS — F17200 Nicotine dependence, unspecified, uncomplicated: Secondary | ICD-10-CM

## 2023-06-25 DIAGNOSIS — F909 Attention-deficit hyperactivity disorder, unspecified type: Secondary | ICD-10-CM | POA: Diagnosis not present

## 2023-06-25 DIAGNOSIS — M1711 Unilateral primary osteoarthritis, right knee: Secondary | ICD-10-CM | POA: Diagnosis not present

## 2023-06-25 DIAGNOSIS — N390 Urinary tract infection, site not specified: Secondary | ICD-10-CM | POA: Insufficient documentation

## 2023-06-25 DIAGNOSIS — Z716 Tobacco abuse counseling: Secondary | ICD-10-CM

## 2023-06-25 MED ORDER — AMPHETAMINE-DEXTROAMPHETAMINE 5 MG PO TABS: 5.0000 mg | ORAL_TABLET | Freq: Every day | ORAL | 0 refills | Status: DC

## 2023-06-25 MED ORDER — TRIMETHOPRIM 100 MG PO TABS
100.0000 mg | ORAL_TABLET | Freq: Every day | ORAL | 1 refills | Status: DC | PRN
Start: 2023-06-25 — End: 2023-09-18

## 2023-06-25 MED ORDER — MELOXICAM 7.5 MG PO TABS
7.5000 mg | ORAL_TABLET | Freq: Two times a day (BID) | ORAL | 1 refills | Status: DC
Start: 2023-06-25 — End: 2023-09-18

## 2023-06-25 NOTE — Assessment & Plan Note (Signed)
Stable on Adderall 5 mg every morning. No side effects. PDMP reviewed.  Up-to-date contract and UDS

## 2023-06-25 NOTE — Progress Notes (Signed)
   Established Patient Office Visit  Subjective   Patient ID: Kristin Benjamin, female    DOB: February 16, 1956  Age: 67 y.o. MRN: [id number]  Chief Complaint  Patient presents with   ADHD    HPI  Patient is here for chronic disease management. No new concerns today. Her daughter and grandchildren have been visiting from [place] this month!   ADHD/ADD: - Medications: Adderall 5 mg daily - Compliance: good - Side effects: none - Concerns: none   Right knee osteoarthritis: - She saw ortho and has been getting good relief with meloxicam- requesting refill.  - She is planning to have knee replacement after she gets on Medicare in November.    Hx of frequent UTI: - No acute concerns.  - Requesting refill for PRN trimethoprim        ROS All review of systems negative except what is listed in the HPI    Objective:     BP 121/85   Pulse 86   Ht 5\' 8"  (1.727 m)   Wt 173 lb (78.5 kg)   SpO2 95%   BMI 26.30 kg/m    Physical Exam Vitals reviewed.  Constitutional:      Appearance: Normal appearance.  Cardiovascular:     Rate and Rhythm: Normal rate and regular rhythm.     Pulses: Normal pulses.     Heart sounds: Normal heart sounds.  Pulmonary:     Effort: Pulmonary effort is normal.     Breath sounds: Normal breath sounds.  Skin:    General: Skin is warm and dry.  Neurological:     Mental Status: She is alert and oriented to person, place, and time.  Psychiatric:        Mood and Affect: Mood normal.        Behavior: Behavior normal.        Thought Content: Thought content normal.        Judgment: Judgment normal.      No results found for any visits on 06/25/23.    The 10-year ASCVD risk score (Arnett DK, et al., 2019) is: 13.4%    Assessment & Plan:   Problem List Items Addressed This Visit     Unilateral primary osteoarthritis, right knee    Refilling meloxicam Continue supportive measures Following with ortho - hoping for knee replacement  this fall/winter      Relevant Medications   meloxicam (MOBIC) 7.5 MG tablet   Attention deficit hyperactivity disorder (ADHD) (Chronic)    Stable on Adderall 5 mg every morning. No side effects. PDMP reviewed.  Up-to-date contract and UDS       Relevant Medications   amphetamine-dextroamphetamine (ADDERALL) 5 MG tablet (Start on 08/23/2023)   amphetamine-dextroamphetamine (ADDERALL) 5 MG tablet (Start on 07/25/2023)   amphetamine-dextroamphetamine (ADDERALL) 5 MG tablet   Frequent UTI    No acute concerns Refilling PRN trimethoprim.  Continue supportive measures       Relevant Medications   trimethoprim (TRIMPEX) 100 MG tablet    Return in about 3 months (around 09/25/2023) for physical.    Clayborne Dana, NP

## 2023-06-25 NOTE — Assessment & Plan Note (Signed)
Refilling meloxicam Continue supportive measures Following with ortho - hoping for knee replacement this fall/winter

## 2023-06-25 NOTE — Assessment & Plan Note (Signed)
No acute concerns Refilling PRN trimethoprim.  Continue supportive measures

## 2023-07-02 ENCOUNTER — Ambulatory Visit: Payer: 59 | Admitting: Physician Assistant

## 2023-08-02 ENCOUNTER — Encounter: Payer: Self-pay | Admitting: Family Medicine

## 2023-08-02 ENCOUNTER — Ambulatory Visit: Payer: 59 | Admitting: Family Medicine

## 2023-08-02 VITALS — BP 108/64 | HR 86 | Temp 97.8°F | Ht 68.0 in | Wt 174.0 lb

## 2023-08-02 DIAGNOSIS — R350 Frequency of micturition: Secondary | ICD-10-CM | POA: Diagnosis not present

## 2023-08-02 DIAGNOSIS — R3915 Urgency of urination: Secondary | ICD-10-CM | POA: Diagnosis not present

## 2023-08-02 LAB — POC URINALSYSI DIPSTICK (AUTOMATED)
Bilirubin, UA: NEGATIVE
Blood, UA: NEGATIVE
Glucose, UA: NEGATIVE
Ketones, UA: NEGATIVE
Nitrite, UA: NEGATIVE
Protein, UA: NEGATIVE
Spec Grav, UA: 1.02 (ref 1.010–1.025)
Urobilinogen, UA: 0.2 E.U./dL
pH, UA: 5 (ref 5.0–8.0)

## 2023-08-02 NOTE — Progress Notes (Signed)
Acute Office Visit  Subjective:     Patient ID: Kristin Benjamin, female    DOB: 1956/07/28, 67 y.o.   MRN: [id number]  Chief Complaint  Patient presents with   Urinary Tract Infection    Patient is in today for UTI symptoms.   Discussed the use of AI scribe software for clinical note transcription with the patient, who gave verbal consent to proceed.  History of Present Illness   The patient, with a history of recurrent urinary tract infections (UTIs), presents with a four to five-day history of urinary frequency and urgency. They are concerned about the possibility of another UTI, especially as they are due to travel to [place] next Tuesday. They report that they have not been diligent with their prescribed trimethoprim and vaginal cream, but they still have these medications at home. They deny any fever, chills, body aches, back pain, or abdominal pain. The patient also mentions that they have been waking up in the middle of the night to urinate, which is unusual for them.            ROS All review of systems negative except what is listed in the HPI      Objective:    BP 108/64   Pulse 86   Temp 97.8 F (36.6 C) (Oral)   Ht 5\' 8"  (1.727 m)   Wt 174 lb (78.9 kg)   SpO2 96%   BMI 26.46 kg/m    Physical Exam Vitals reviewed.  Constitutional:      Appearance: Normal appearance.  Cardiovascular:     Rate and Rhythm: Normal rate and regular rhythm.     Heart sounds: Normal heart sounds.  Pulmonary:     Effort: Pulmonary effort is normal.     Breath sounds: Normal breath sounds.  Abdominal:     General: Abdomen is flat. Bowel sounds are normal.     Palpations: Abdomen is soft.     Tenderness: There is no abdominal tenderness. There is no right CVA tenderness, left CVA tenderness or guarding.  Skin:    General: Skin is warm and dry.  Neurological:     Mental Status: She is alert and oriented to person, place, and time.  Psychiatric:        Mood and  Affect: Mood normal.        Behavior: Behavior normal.        Thought Content: Thought content normal.        Judgment: Judgment normal.     Results for orders placed or performed in visit on 08/02/23  POCT Urinalysis Dipstick (Automated)  Result Value Ref Range   Color, UA yellow    Clarity, UA clear    Glucose, UA Negative Negative   Bilirubin, UA negative    Ketones, UA negative    Spec Grav, UA 1.020 1.010 - 1.025   Blood, UA negative    pH, UA 5.0 5.0 - 8.0   Protein, UA Negative Negative   Urobilinogen, UA 0.2 0.2 or 1.0 E.U./dL   Nitrite, UA negative    Leukocytes, UA Trace (A) Negative        Assessment & Plan:   Problem List Items Addressed This Visit   None Visit Diagnoses     Urinary frequency    -  Primary   Relevant Orders   POCT Urinalysis Dipstick (Automated) (Completed)   Urine Culture   Urinary urgency       Relevant Orders   POCT Urinalysis Dipstick (  Automated) (Completed)   Urine Culture         Reports of increased urinary frequency and urgency for the past 4-5 days. Urinalysis shows trace leukocytes. Patient has been inconsistent with prophylactic trimethoprim and vaginal cream. -Resume regular use of trimethoprim and vaginal cream. -Increase fluid intake and continue cranberry supplements. -Wait for urine culture results before starting antibiotics. -If symptoms worsen over the weekend, seek care.        No orders of the defined types were placed in this encounter.   Return if symptoms worsen or fail to improve.  Clayborne Dana, NP

## 2023-08-04 LAB — URINE CULTURE
MICRO NUMBER:: 15489590
SPECIMEN QUALITY:: ADEQUATE

## 2023-08-06 MED ORDER — SULFAMETHOXAZOLE-TRIMETHOPRIM 800-160 MG PO TABS
1.0000 | ORAL_TABLET | Freq: Two times a day (BID) | ORAL | 0 refills | Status: AC
Start: 2023-08-06 — End: 2023-08-09

## 2023-08-06 NOTE — Addendum Note (Signed)
Addended by: Hyman Hopes B on: 08/06/2023 07:52 AM   Modules accepted: Orders

## 2023-08-11 ENCOUNTER — Encounter: Payer: Self-pay | Admitting: Family Medicine

## 2023-08-17 ENCOUNTER — Ambulatory Visit: Payer: 59 | Admitting: Family Medicine

## 2023-08-23 ENCOUNTER — Encounter: Payer: Self-pay | Admitting: Orthopaedic Surgery

## 2023-09-14 ENCOUNTER — Other Ambulatory Visit: Payer: Self-pay | Admitting: Family Medicine

## 2023-09-14 DIAGNOSIS — F419 Anxiety disorder, unspecified: Secondary | ICD-10-CM

## 2023-09-14 DIAGNOSIS — R040 Epistaxis: Secondary | ICD-10-CM | POA: Diagnosis not present

## 2023-09-14 DIAGNOSIS — F32A Depression, unspecified: Secondary | ICD-10-CM

## 2023-09-17 ENCOUNTER — Encounter: Payer: Self-pay | Admitting: Orthopaedic Surgery

## 2023-09-17 ENCOUNTER — Ambulatory Visit: Payer: Medicare Other | Admitting: Orthopaedic Surgery

## 2023-09-17 VITALS — BP 129/84 | HR 84 | Ht 68.0 in | Wt 174.0 lb

## 2023-09-17 DIAGNOSIS — G8929 Other chronic pain: Secondary | ICD-10-CM | POA: Diagnosis not present

## 2023-09-17 DIAGNOSIS — M1711 Unilateral primary osteoarthritis, right knee: Secondary | ICD-10-CM

## 2023-09-17 DIAGNOSIS — M25561 Pain in right knee: Secondary | ICD-10-CM

## 2023-09-17 NOTE — Progress Notes (Signed)
The patient is a 67 year old female who is scheduled for a right total knee arthroplasty next week on November 15.  We actually replaced her left knee back in 2019 and that has done well.  She is very active.  She is now developing some sciatica on her right side as she has been favoring her right knee for some time now.  She has been unable to play pickle ball as result of her right knee.  X-rays back earlier this year shows severe end-stage arthritis of the right knee.  We did review those x-rays together again today showing varus malalignment and bone-on-bone wear the medial compartment and patellofemoral joint of the right knee.  We were able to review all of her medications within epic and her past medical history as well.  She is not a diabetic.  She is thin.  She is not on blood thinning medications either.  Her blood pressure is 129/84 with a heart rate of 84  There is currently listed no headache, chest pain, shortness of breath, fever, chills, nausea, vomiting  Examination of her right knee shows a mild effusion.  There is significant medial joint line tenderness and pain throughout the arc of motion of the right knee.  There is varus malalignment that is correctable.  We talked again about her intraoperative and postoperative course and the risk and benefits of the surgery.  Having had this before on her left knee she is fully aware of what everything involves.  We will see her the day of surgery on November 15 for a right total knee arthroplasty.  All question concerns were answered breast.

## 2023-09-18 ENCOUNTER — Encounter: Payer: Self-pay | Admitting: Family Medicine

## 2023-09-18 ENCOUNTER — Other Ambulatory Visit: Payer: Self-pay | Admitting: Family Medicine

## 2023-09-18 ENCOUNTER — Ambulatory Visit: Payer: Medicare Other | Admitting: Family Medicine

## 2023-09-18 VITALS — BP 120/89 | HR 88 | Ht 68.0 in | Wt 174.0 lb

## 2023-09-18 DIAGNOSIS — I10 Essential (primary) hypertension: Secondary | ICD-10-CM | POA: Diagnosis not present

## 2023-09-18 DIAGNOSIS — F32A Depression, unspecified: Secondary | ICD-10-CM

## 2023-09-18 DIAGNOSIS — N390 Urinary tract infection, site not specified: Secondary | ICD-10-CM

## 2023-09-18 DIAGNOSIS — E782 Mixed hyperlipidemia: Secondary | ICD-10-CM | POA: Diagnosis not present

## 2023-09-18 DIAGNOSIS — F17200 Nicotine dependence, unspecified, uncomplicated: Secondary | ICD-10-CM

## 2023-09-18 DIAGNOSIS — K080 Exfoliation of teeth due to systemic causes: Secondary | ICD-10-CM | POA: Diagnosis not present

## 2023-09-18 DIAGNOSIS — F419 Anxiety disorder, unspecified: Secondary | ICD-10-CM

## 2023-09-18 DIAGNOSIS — G43909 Migraine, unspecified, not intractable, without status migrainosus: Secondary | ICD-10-CM

## 2023-09-18 DIAGNOSIS — Z0000 Encounter for general adult medical examination without abnormal findings: Secondary | ICD-10-CM

## 2023-09-18 DIAGNOSIS — F909 Attention-deficit hyperactivity disorder, unspecified type: Secondary | ICD-10-CM

## 2023-09-18 DIAGNOSIS — Z716 Tobacco abuse counseling: Secondary | ICD-10-CM

## 2023-09-18 LAB — COMPREHENSIVE METABOLIC PANEL
ALT: 34 U/L (ref 0–35)
AST: 26 U/L (ref 0–37)
Albumin: 4.5 g/dL (ref 3.5–5.2)
Alkaline Phosphatase: 85 U/L (ref 39–117)
BUN: 22 mg/dL (ref 6–23)
CO2: 29 meq/L (ref 19–32)
Calcium: 9.8 mg/dL (ref 8.4–10.5)
Chloride: 102 meq/L (ref 96–112)
Creatinine, Ser: 0.72 mg/dL (ref 0.40–1.20)
GFR: 88.03 mL/min (ref >60.00)
Glucose, Bld: 94 mg/dL (ref 70–99)
Potassium: 4.7 meq/L (ref 3.5–5.1)
Sodium: 139 meq/L (ref 135–145)
Total Bilirubin: 0.6 mg/dL (ref 0.2–1.2)
Total Protein: 7.2 g/dL (ref 6.0–8.3)

## 2023-09-18 LAB — CBC WITH DIFFERENTIAL/PLATELET
Basophils Absolute: 0.1 10*3/uL (ref 0.0–0.1)
Basophils Relative: 0.9 % (ref 0.0–3.0)
Eosinophils Absolute: 0.1 10*3/uL (ref 0.0–0.7)
Eosinophils Relative: 2.2 % (ref 0.0–5.0)
HCT: 46.5 % — ABNORMAL HIGH (ref 36.0–46.0)
Hemoglobin: 15.2 g/dL — ABNORMAL HIGH (ref 12.0–15.0)
Lymphocytes Relative: 27.1 % (ref 12.0–46.0)
Lymphs Abs: 1.7 10*3/uL (ref 0.7–4.0)
MCHC: 32.7 g/dL (ref 30.0–36.0)
MCV: 98.1 fL (ref 78.0–100.0)
Monocytes Absolute: 0.5 10*3/uL (ref 0.1–1.0)
Monocytes Relative: 8.4 % (ref 3.0–12.0)
Neutro Abs: 3.8 10*3/uL (ref 1.4–7.7)
Neutrophils Relative %: 61.4 % (ref 43.0–77.0)
Platelets: 368 10*3/uL (ref 150.0–400.0)
RBC: 4.74 Mil/uL (ref 3.87–5.11)
RDW: 12.5 % (ref 11.5–15.5)
WBC: 6.1 10*3/uL (ref 4.0–10.5)

## 2023-09-18 LAB — LIPID PANEL
Cholesterol: 218 mg/dL — ABNORMAL HIGH (ref 0–200)
HDL: 45.8 mg/dL (ref >39.00)
LDL Cholesterol: 126 mg/dL — ABNORMAL HIGH (ref 0–99)
NonHDL: 171.91
Total CHOL/HDL Ratio: 5
Triglycerides: 228 mg/dL — ABNORMAL HIGH (ref 0.0–149.0)
VLDL: 45.6 mg/dL — ABNORMAL HIGH (ref 0.0–40.0)

## 2023-09-18 LAB — TSH: TSH: 1.16 u[IU]/mL (ref 0.35–5.50)

## 2023-09-18 MED ORDER — ESTRADIOL 0.1 MG/GM VA CREA: 1.0000 | TOPICAL_CREAM | VAGINAL | 1 refills | Status: AC

## 2023-09-18 MED ORDER — TRIMETHOPRIM 100 MG PO TABS: 100.0000 mg | ORAL_TABLET | Freq: Every day | ORAL | 1 refills | Status: DC | PRN

## 2023-09-18 MED ORDER — AMPHETAMINE-DEXTROAMPHETAMINE 5 MG PO TABS: 5.0000 mg | ORAL_TABLET | Freq: Every day | ORAL | 0 refills | Status: DC

## 2023-09-18 MED ORDER — SUMATRIPTAN SUCCINATE 100 MG PO TABS: 100.0000 mg | ORAL_TABLET | ORAL | 2 refills | Status: DC | PRN

## 2023-09-18 MED ORDER — ATORVASTATIN CALCIUM 40 MG PO TABS: 40.0000 mg | ORAL_TABLET | Freq: Every day | ORAL | 1 refills | Status: DC

## 2023-09-18 MED ORDER — SERTRALINE HCL 50 MG PO TABS: 50.0000 mg | ORAL_TABLET | Freq: Every day | ORAL | 0 refills | Status: DC

## 2023-09-18 MED ORDER — BUPROPION HCL ER (SR) 150 MG PO TB12: 150.0000 mg | ORAL_TABLET | Freq: Every day | ORAL | 3 refills | Status: DC

## 2023-09-18 MED ORDER — ATORVASTATIN CALCIUM 80 MG PO TABS: 80.0000 mg | ORAL_TABLET | Freq: Every day | ORAL | 3 refills | Status: DC

## 2023-09-18 MED ORDER — VALSARTAN-HYDROCHLOROTHIAZIDE 160-12.5 MG PO TABS: 1.0000 | ORAL_TABLET | Freq: Every day | ORAL | 1 refills | Status: DC

## 2023-09-18 NOTE — Progress Notes (Signed)
Complete physical exam  Patient: Kristin Benjamin   DOB: [birth year]   67 y.o. Female  MRN: [id number]  Subjective:    Chief Complaint  Patient presents with   Annual Exam    Kristin Benjamin is a 67 y.o. female who presents today for a complete physical exam. She reports consuming a general diet. Exercise is limited by orthopedic condition(s): right knee pain. She generally feels well. She reports sleeping inconsistently. She does not have additional problems to discuss today.   Currently lives with: husband Acute concerns or interim problems since last visit: no -Total right knee replacement scheduled for next week with Dr. Magnus Ivan -She had 8 nosed bleeds over a few weeks recently and went to ENT - they started mupirocin ointment twice a day with saline spray throughout the day. No concerns currently.  Vision concerns: no Dental concerns: no   Most recent fall risk assessment:    09/18/2023    8:17 AM  Fall Risk   Falls in the past year? 0  Number falls in past yr: 0  Injury with Fall? 0  Risk for fall due to : No Fall Risks  Follow up Falls evaluation completed     Most recent depression screenings:    09/18/2023    8:17 AM 03/19/2023    8:52 AM  PHQ 2/9 Scores  PHQ - 2 Score 0 3  PHQ- 9 Score  4            Patient Care Team: Clayborne Dana, NP as PCP - General (Family Medicine)   Outpatient Medications Prior to Visit  Medication Sig   CANNABIDIOL PO Take 750 mg by mouth in the morning. CBD   cetirizine (ZYRTEC) 10 MG tablet Take 10 mg by mouth in the morning.   [DISCONTINUED] amphetamine-dextroamphetamine (ADDERALL) 5 MG tablet Take 1 tablet (5 mg total) by mouth daily.   [DISCONTINUED] atorvastatin (LIPITOR) 40 MG tablet TAKE 1 TABLET BY MOUTH DAILY   [DISCONTINUED] buPROPion (WELLBUTRIN SR) 150 MG 12 hr tablet TAKE 1 TABLET BY MOUTH TWICE A DAY, TAKE 1 TABLET DAILY FOR THE FIRST 3-5 DAYS, THEN INCREASE TO TAKE 1 TABLET BY MOUTH TWICE A DAY AND  CONTINUE FOR 7-12 WEEKS, TAKE EVENING DOSE NO LATER THAN 6PM, STOP SMOKING AFTER THE FIRST 5-7 DAYS OF TREATMENT (Patient taking differently: Take 150 mg by mouth in the morning.)   [DISCONTINUED] estradiol (ESTRACE VAGINAL) 0.1 MG/GM vaginal cream Place 1 Applicatorful vaginally at bedtime. (Patient taking differently: Place 1 Applicatorful vaginally every other day. At night.)   [DISCONTINUED] meloxicam (MOBIC) 7.5 MG tablet Take 1 tablet (7.5 mg total) by mouth 2 (two) times daily. (Patient taking differently: Take 7.5 mg by mouth in the morning.)   [DISCONTINUED] sertraline (ZOLOFT) 50 MG tablet TAKE 1 TABLET BY MOUTH DAILY   [DISCONTINUED] SUMAtriptan (IMITREX) 100 MG tablet sumatriptan 100 mg tablet (Patient taking differently: Take 100 mg by mouth every 2 (two) hours as needed for migraine.)   [DISCONTINUED] trimethoprim (TRIMPEX) 100 MG tablet Take 1 tablet (100 mg total) by mouth daily as needed (after intercourse).   [DISCONTINUED] valsartan-hydrochlorothiazide (DIOVAN-HCT) 160-12.5 MG tablet Take 1 tablet by mouth daily.   No facility-administered medications prior to visit.    ROS All review of systems negative except what is listed in the HPI        Objective:     BP 120/89   Pulse 88   Ht 5\' 8"  (1.727 m)   Wt 174  lb (78.9 kg)   SpO2 98%   BMI 26.46 kg/m    Physical Exam Vitals reviewed.  Constitutional:      General: She is not in acute distress.    Appearance: Normal appearance. She is not ill-appearing.  HENT:     Head: Normocephalic and atraumatic.     Right Ear: Tympanic membrane normal.     Left Ear: Tympanic membrane normal.     Nose: Nose normal.     Mouth/Throat:     Mouth: Mucous membranes are moist.     Pharynx: Oropharynx is clear.  Eyes:     Extraocular Movements: Extraocular movements intact.     Conjunctiva/sclera: Conjunctivae normal.     Pupils: Pupils are equal, round, and reactive to light.  Neck:     Vascular: No carotid bruit.   Cardiovascular:     Rate and Rhythm: Normal rate and regular rhythm.     Pulses: Normal pulses.     Heart sounds: Normal heart sounds.  Pulmonary:     Effort: Pulmonary effort is normal.     Breath sounds: Normal breath sounds.  Abdominal:     General: Abdomen is flat. Bowel sounds are normal. There is no distension.     Palpations: Abdomen is soft. There is no mass.     Tenderness: There is no abdominal tenderness. There is no right CVA tenderness, left CVA tenderness, guarding or rebound.  Genitourinary:    Comments: Deferred exam Musculoskeletal:        General: Normal range of motion.     Cervical back: Normal range of motion and neck supple. No tenderness.     Right lower leg: No edema.     Left lower leg: No edema.  Lymphadenopathy:     Cervical: No cervical adenopathy.  Skin:    General: Skin is warm and dry.     Capillary Refill: Capillary refill takes less than 2 seconds.  Neurological:     General: No focal deficit present.     Mental Status: She is alert and oriented to person, place, and time. Mental status is at baseline.  Psychiatric:        Mood and Affect: Mood normal.        Behavior: Behavior normal.        Thought Content: Thought content normal.        Judgment: Judgment normal.       No results found for any visits on 09/18/23.     Assessment & Plan:    Routine Health Maintenance and Physical Exam Discussed health promotion and safety including diet and exercise recommendations, dental health, and injury prevention. Tobacco cessation if applicable. Seat belts, sunscreen, smoke detectors, etc.    Immunization History  Administered Date(s) Administered   Influenza,inj,Quad PF,6+ Mos 07/30/2018, 08/06/2019, 08/31/2022   Influenza,inj,quad, With Preservative 07/11/2021   Influenza-Unspecified 08/14/2019   PFIZER(Purple Top)SARS-COV-2 Vaccination 01/05/2020, 01/27/2020, 08/25/2020   Pfizer Covid-19 Vaccine Bivalent Booster 59yrs & up 08/10/2021    Pfizer(Comirnaty)Fall Seasonal Vaccine 12 years and older 08/31/2022   Pneumococcal Polysaccharide-23 09/22/2014   Tdap 06/23/2013, 12/02/2019, 12/15/2019   Zoster Recombinant(Shingrix) 08/06/2019, 12/29/2019   Zoster, Live 06/23/2013, 08/06/2019, 11/11/2019, 12/29/2019    Health Maintenance  Topic Date Due   Medicare Annual Wellness (AWV)  Never done   HIV Screening  Never done   Hepatitis C Screening  Never done   INFLUENZA VACCINE  02/11/2024 (Originally 06/14/2023)   COVID-19 Vaccine (6 - 2023-24 season) 09/16/2024 (Originally 07/15/2023)  Lung Cancer Screening  05/17/2024   MAMMOGRAM  10/02/2024   Colonoscopy  12/01/2025   Cervical Cancer Screening (HPV/Pap Cotest)  05/18/2026   DTaP/Tdap/Td (4 - Td or Tdap) 12/14/2029   Zoster Vaccines- Shingrix  Completed   HPV VACCINES  Aged Out        Problem List Items Addressed This Visit       Active Problems   Mixed hyperlipidemia (Chronic)   Relevant Medications   atorvastatin (LIPITOR) 40 MG tablet   valsartan-hydrochlorothiazide (DIOVAN-HCT) 160-12.5 MG tablet   Other Relevant Orders   Lipid panel   Primary hypertension (Chronic)   Relevant Medications   atorvastatin (LIPITOR) 40 MG tablet   valsartan-hydrochlorothiazide (DIOVAN-HCT) 160-12.5 MG tablet   Other Relevant Orders   Comprehensive metabolic panel   Anxiety and depression (Chronic)   Relevant Medications   buPROPion (WELLBUTRIN SR) 150 MG 12 hr tablet   sertraline (ZOLOFT) 50 MG tablet   Attention deficit hyperactivity disorder (ADHD) (Chronic)   Relevant Medications   amphetamine-dextroamphetamine (ADDERALL) 5 MG tablet (Start on 09/28/2023)   amphetamine-dextroamphetamine (ADDERALL) 5 MG tablet (Start on 10/27/2023)   amphetamine-dextroamphetamine (ADDERALL) 5 MG tablet (Start on 11/26/2023)   Frequent UTI   Relevant Medications   trimethoprim (TRIMPEX) 100 MG tablet   Other Visit Diagnoses     Annual physical exam    -  Primary   Relevant Orders    CBC with Differential/Platelet   Comprehensive metabolic panel   Lipid panel   TSH   Tobacco dependence       Relevant Medications   buPROPion (WELLBUTRIN SR) 150 MG 12 hr tablet   Encounter for smoking cessation counseling       Relevant Medications   buPROPion (WELLBUTRIN SR) 150 MG 12 hr tablet   Migraine without status migrainosus, not intractable, unspecified migraine type       Relevant Medications   atorvastatin (LIPITOR) 40 MG tablet   buPROPion (WELLBUTRIN SR) 150 MG 12 hr tablet   sertraline (ZOLOFT) 50 MG tablet   SUMAtriptan (IMITREX) 100 MG tablet   valsartan-hydrochlorothiazide (DIOVAN-HCT) 160-12.5 MG tablet      Return in about 3 months (around 12/19/2023) for routine follow-up.     Clayborne Dana, NP

## 2023-09-19 NOTE — Patient Instructions (Signed)
SURGICAL WAITING ROOM VISITATION  Patients having surgery or a procedure may have no more than 2 support people in the waiting area - these visitors may rotate.    Children under the age of 63 must have an adult with them who is not the patient.  Due to an increase in RSV and influenza rates and associated hospitalizations, children ages 52 and under may not visit patients in [facility] hospitals.  If the patient needs to stay at the hospital during part of their recovery, the visitor guidelines for inpatient rooms apply. Pre-op nurse will coordinate an appropriate time for 1 support person to accompany patient in pre-op.  This support person may not rotate.    Please refer to the [facility] website for the visitor guidelines for Inpatients (after your surgery is over and you are in a regular room).    Your procedure is scheduled on: 09/28/23   Report to [facility] Main Entrance    Report to admitting at 5:45 AM   Call this number if you have problems the morning of surgery [phone]   Do not eat food :After Midnight.   After Midnight you may have the following liquids until 5:15 AM DAY OF SURGERY  Water Non-Citrus Juices (without pulp, NO RED-Apple, White grape, White cranberry) Black Coffee (NO MILK/CREAM OR CREAMERS, sugar ok)  Clear Tea (NO MILK/CREAM OR CREAMERS, sugar ok) regular and decaf                             Plain Jell-O (NO RED)                                           Fruit ices (not with fruit pulp, NO RED)                                     Popsicles (NO RED)                                                               Sports drinks like Gatorade (NO RED)    The day of surgery:  Drink ONE (1) Pre-Surgery Clear Ensure at 5:15 AM the morning of surgery. Drink in one sitting. Do not sip.  This drink was given to you during your hospital  pre-op appointment visit. Nothing else to drink after completing the  Pre-Surgery Clear Ensure.          If  you have questions, please contact your surgeon's office.   FOLLOW BOWEL PREP AND ANY ADDITIONAL PRE OP INSTRUCTIONS YOU RECEIVED FROM YOUR SURGEON'S OFFICE!!!     Oral Hygiene is also important to reduce your risk of infection.                                    Remember - BRUSH YOUR TEETH THE MORNING OF SURGERY WITH YOUR REGULAR TOOTHPASTE  DENTURES WILL BE REMOVED PRIOR TO SURGERY PLEASE DO NOT APPLY "Poly grip" OR ADHESIVES!!!  Do NOT smoke after Midnight   Stop all vitamins and herbal supplements 7 days before surgery.   Take these medicines the morning of surgery with A SIP OF WATER: Atorvastatin, Bupropion, Zyrtec, Sertraline   DO NOT TAKE ANY ORAL DIABETIC MEDICATIONS DAY OF YOUR SURGERY  Bring CPAP mask and tubing day of surgery.                              You may not have any metal on your body including hair pins, jewelry, and body piercing             Do not wear make-up, lotions, powders, perfumes, or deodorant  Do not wear nail polish including gel and S&S, artificial/acrylic nails, or any other type of covering on natural nails including finger and toenails. If you have artificial nails, gel coating, etc. that needs to be removed by a nail salon please have this removed prior to surgery or surgery may need to be canceled/ delayed if the surgeon/ anesthesia feels like they are unable to be safely monitored.   Do not shave  48 hours prior to surgery.    Do not bring valuables to the hospital. [facility] IS NOT             RESPONSIBLE   FOR VALUABLES.   Contacts, glasses, dentures or bridgework may not be worn into surgery.   Bring small overnight bag day of surgery.   DO NOT BRING YOUR HOME MEDICATIONS TO THE HOSPITAL. PHARMACY WILL DISPENSE MEDICATIONS LISTED ON YOUR MEDICATION LIST TO YOU DURING YOUR ADMISSION IN THE HOSPITAL!   Special Instructions: Bring a copy of your healthcare power of attorney and living will documents the day of surgery if you haven't  scanned them before.              Please read over the following fact sheets you were given: IF YOU HAVE QUESTIONS ABOUT YOUR PRE-OP INSTRUCTIONS PLEASE CALL [phone] 9662Fleet Contras    If you received a COVID test during your pre-op visit  it is requested that you wear a mask when out in public, stay away from anyone that may not be feeling well and notify your surgeon if you develop symptoms. If you test positive for Covid or have been in contact with anyone that has tested positive in the last 10 days please notify you surgeon.      Pre-operative 5 CHG Bath Instructions   You can play a key role in reducing the risk of infection after surgery. Your skin needs to be as free of germs as possible. You can reduce the number of germs on your skin by washing with CHG (chlorhexidine gluconate) soap before surgery. CHG is an antiseptic soap that kills germs and continues to kill germs even after washing.   DO NOT use if you have an allergy to chlorhexidine/CHG or antibacterial soaps. If your skin becomes reddened or irritated, stop using the CHG and notify one of our RNs at [phone].   Please shower with the CHG soap starting 4 days before surgery using the following schedule:     Please keep in mind the following:  DO NOT shave, including legs and underarms, starting the day of your first shower.   You may shave your face at any point before/day of surgery.  Place clean sheets on your bed the day you start using CHG soap. Use a clean washcloth (not used since  being washed) for each shower. DO NOT sleep with pets once you start using the CHG.   CHG Shower Instructions:  If you choose to wash your hair and private area, wash first with your normal shampoo/soap.  After you use shampoo/soap, rinse your hair and body thoroughly to remove shampoo/soap residue.  Turn the water OFF and apply about 3 tablespoons (45 ml) of CHG soap to a CLEAN washcloth.  Apply CHG soap ONLY FROM YOUR NECK DOWN TO  YOUR TOES (washing for 3-5 minutes)  DO NOT use CHG soap on face, private areas, open wounds, or sores.  Pay special attention to the area where your surgery is being performed.  If you are having back surgery, having someone wash your back for you may be helpful. Wait 2 minutes after CHG soap is applied, then you may rinse off the CHG soap.  Pat dry with a clean towel  Put on clean clothes/pajamas   If you choose to wear lotion, please use ONLY the CHG-compatible lotions on the back of this paper.     Additional instructions for the day of surgery: DO NOT APPLY any lotions, deodorants, cologne, or perfumes.   Put on clean/comfortable clothes.  Brush your teeth.  Ask your nurse before applying any prescription medications to the skin.      CHG Compatible Lotions   Aveeno Moisturizing lotion  Cetaphil Moisturizing Cream  Cetaphil Moisturizing Lotion  Clairol Herbal Essence Moisturizing Lotion, Dry Skin  Clairol Herbal Essence Moisturizing Lotion, Extra Dry Skin  Clairol Herbal Essence Moisturizing Lotion, Normal Skin  Curel Age Defying Therapeutic Moisturizing Lotion with Alpha Hydroxy  Curel Extreme Care Body Lotion  Curel Soothing Hands Moisturizing Hand Lotion  Curel Therapeutic Moisturizing Cream, Fragrance-Free  Curel Therapeutic Moisturizing Lotion, Fragrance-Free  Curel Therapeutic Moisturizing Lotion, Original Formula  Eucerin Daily Replenishing Lotion  Eucerin Dry Skin Therapy Plus Alpha Hydroxy Crme  Eucerin Dry Skin Therapy Plus Alpha Hydroxy Lotion  Eucerin Original Crme  Eucerin Original Lotion  Eucerin Plus Crme Eucerin Plus Lotion  Eucerin TriLipid Replenishing Lotion  Keri Anti-Bacterial Hand Lotion  Keri Deep Conditioning Original Lotion Dry Skin Formula Softly Scented  Keri Deep Conditioning Original Lotion, Fragrance Free Sensitive Skin Formula  Keri Lotion Fast Absorbing Fragrance Free Sensitive Skin Formula  Keri Lotion Fast Absorbing Softly Scented  Dry Skin Formula  Keri Original Lotion  Keri Skin Renewal Lotion Keri Silky Smooth Lotion  Keri Silky Smooth Sensitive Skin Lotion  Nivea Body Creamy Conditioning Oil  Nivea Body Extra Enriched Lotion  Nivea Body Original Lotion  Nivea Body Sheer Moisturizing Lotion Nivea Crme  Nivea Skin Firming Lotion  NutraDerm 30 Skin Lotion  NutraDerm Skin Lotion  NutraDerm Therapeutic Skin Cream  NutraDerm Therapeutic Skin Lotion  ProShield Protective Hand Cream  Provon moisturizing lotion   Incentive Spirometer  An incentive spirometer is a tool that can help keep your lungs clear and active. This tool measures how well you are filling your lungs with each breath. Taking long deep breaths may help reverse or decrease the chance of developing breathing (pulmonary) problems (especially infection) following: A long period of time when you are unable to move or be active. BEFORE THE PROCEDURE  If the spirometer includes an indicator to show your best effort, your nurse or respiratory therapist will set it to a desired goal. If possible, sit up straight or lean slightly forward. Try not to slouch. Hold the incentive spirometer in an upright position. INSTRUCTIONS FOR USE  Sit on  the edge of your bed if possible, or sit up as far as you can in bed or on a chair. Hold the incentive spirometer in an upright position. Breathe out normally. Place the mouthpiece in your mouth and seal your lips tightly around it. Breathe in slowly and as deeply as possible, raising the piston or the ball toward the top of the column. Hold your breath for 3-5 seconds or for as long as possible. Allow the piston or ball to fall to the bottom of the column. Remove the mouthpiece from your mouth and breathe out normally. Rest for a few seconds and repeat Steps 1 through 7 at least 10 times every 1-2 hours when you are awake. Take your time and take a few normal breaths between deep breaths. The spirometer may include an  indicator to show your best effort. Use the indicator as a goal to work toward during each repetition. After each set of 10 deep breaths, practice coughing to be sure your lungs are clear. If you have an incision (the cut made at the time of surgery), support your incision when coughing by placing a pillow or rolled up towels firmly against it. Once you are able to get out of bed, walk around indoors and cough well. You may stop using the incentive spirometer when instructed by your caregiver.  RISKS AND COMPLICATIONS Take your time so you do not get dizzy or light-headed. If you are in pain, you may need to take or ask for pain medication before doing incentive spirometry. It is harder to take a deep breath if you are having pain. AFTER USE Rest and breathe slowly and easily. It can be helpful to keep track of a log of your progress. Your caregiver can provide you with a simple table to help with this. If you are using the spirometer at home, follow these instructions: SEEK MEDICAL CARE IF:  You are having difficultly using the spirometer. You have trouble using the spirometer as often as instructed. Your pain medication is not giving enough relief while using the spirometer. You develop fever of 100.5 F (38.1 C) or higher. SEEK IMMEDIATE MEDICAL CARE IF:  You cough up bloody sputum that had not been present before. You develop fever of 102 F (38.9 C) or greater. You develop worsening pain at or near the incision site. MAKE SURE YOU:  Understand these instructions. Will watch your condition. Will get help right away if you are not doing well or get worse. Document Released: 03/12/2007 Document Revised: 01/22/2012 Document Reviewed: 05/13/2007 [facility] Patient Information 2014 [place], [place].   ________________________________________________________________________

## 2023-09-19 NOTE — Progress Notes (Signed)
COVID Vaccine Completed: yes  Date of COVID positive in last 90 days:  PCP - Hyman Hopes, NP Cardiologist -   CT- 05/18/23 Epic Chest x-ray -  EKG -  Stress Test -  ECHO -  Cardiac Cath -  Pacemaker/ICD device last checked: Spinal Cord Stimulator:  Bowel Prep -   Sleep Study -  CPAP -   Fasting Blood Sugar -  Checks Blood Sugar _____ times a day  Last dose of GLP1 agonist-  N/A GLP1 instructions:  Hold 7 days before surgery    Last dose of SGLT-2 inhibitors-  N/A SGLT-2 instructions:  Hold 3 days before surgery    Blood Thinner Instructions:  Time Aspirin Instructions: Last Dose:  Activity level:  Can go up a flight of stairs and perform activities of daily living without stopping and without symptoms of chest pain or shortness of breath.  Able to exercise without symptoms  Unable to go up a flight of stairs without symptoms of     Anesthesia review: HTN, CAD, aortic atherosclerosis, heart murmur  Patient denies shortness of breath, fever, cough and chest pain at PAT appointment  Patient verbalized understanding of instructions that were given to them at the PAT appointment. Patient was also instructed that they will need to review over the PAT instructions again at home before surgery.

## 2023-09-20 ENCOUNTER — Other Ambulatory Visit: Payer: Self-pay

## 2023-09-20 ENCOUNTER — Encounter (HOSPITAL_COMMUNITY): Payer: Self-pay

## 2023-09-20 ENCOUNTER — Encounter (HOSPITAL_COMMUNITY)
Admission: RE | Admit: 2023-09-20 | Discharge: 2023-09-20 | Disposition: A | Discharge status: Home / Self Care | Payer: Medicare Other | Source: Ambulatory Visit | Attending: Orthopaedic Surgery | Admitting: Orthopaedic Surgery

## 2023-09-20 VITALS — BP 118/89 | HR 87 | Temp 98.7°F | Resp 16 | Ht 68.0 in | Wt 174.0 lb

## 2023-09-20 DIAGNOSIS — H524 Presbyopia: Secondary | ICD-10-CM | POA: Diagnosis not present

## 2023-09-20 DIAGNOSIS — R011 Cardiac murmur, unspecified: Secondary | ICD-10-CM | POA: Insufficient documentation

## 2023-09-20 DIAGNOSIS — I700 Atherosclerosis of aorta: Secondary | ICD-10-CM | POA: Insufficient documentation

## 2023-09-20 DIAGNOSIS — M1711 Unilateral primary osteoarthritis, right knee: Secondary | ICD-10-CM | POA: Insufficient documentation

## 2023-09-20 DIAGNOSIS — J449 Chronic obstructive pulmonary disease, unspecified: Secondary | ICD-10-CM | POA: Diagnosis not present

## 2023-09-20 DIAGNOSIS — I10 Essential (primary) hypertension: Secondary | ICD-10-CM

## 2023-09-20 DIAGNOSIS — Z01818 Encounter for other preprocedural examination: Secondary | ICD-10-CM

## 2023-09-20 DIAGNOSIS — E785 Hyperlipidemia, unspecified: Secondary | ICD-10-CM | POA: Insufficient documentation

## 2023-09-20 LAB — SURGICAL PCR SCREEN
MRSA, PCR: NEGATIVE
Staphylococcus aureus: NEGATIVE

## 2023-09-21 ENCOUNTER — Encounter (HOSPITAL_COMMUNITY): Payer: Self-pay

## 2023-09-21 NOTE — Anesthesia Preprocedure Evaluation (Addendum)
Anesthesia Evaluation  Patient identified by MRN, date of birth, ID band Patient awake    Reviewed: Allergy & Precautions, H&P , NPO status , Patient's Chart, lab work & pertinent test results  Airway Mallampati: II   Neck ROM: full    Dental   Pulmonary Current Smoker and Patient abstained from smoking.   breath sounds clear to auscultation       Cardiovascular hypertension,  Rhythm:regular Rate:Normal     Neuro/Psych  Headaches PSYCHIATRIC DISORDERS Anxiety Depression       GI/Hepatic   Endo/Other    Renal/GU      Musculoskeletal  (+) Arthritis ,    Abdominal   Peds  Hematology   Anesthesia Other Findings   Reproductive/Obstetrics                             Anesthesia Physical Anesthesia Plan  ASA: 2  Anesthesia Plan: MAC and Spinal   Post-op Pain Management: Regional block*   Induction: Intravenous  PONV Risk Score and Plan: 1 and Propofol infusion and Treatment may vary due to age or medical condition  Airway Management Planned: Simple Face Mask  Additional Equipment:   Intra-op Plan:   Post-operative Plan:   Informed Consent: I have reviewed the patients History and Physical, chart, labs and discussed the procedure including the risks, benefits and alternatives for the proposed anesthesia with the patient or authorized representative who has indicated his/her understanding and acceptance.     Dental advisory given  Plan Discussed with: CRNA, Anesthesiologist and Surgeon  Anesthesia Plan Comments: (See PAT note from 11/7 by Sherlie Ban PA-C )        Anesthesia Quick Evaluation

## 2023-09-21 NOTE — Progress Notes (Signed)
Case: [id number] Date/Time: 09/28/23 0800   Procedure: RIGHT TOTAL KNEE ARTHROPLASTY (Right: Knee)   Anesthesia type: Choice   Pre-op diagnosis: Right Knee Osteoarthritis   Location: [facility] ROOM 10 / [facility]   Surgeons: Kathryne Hitch, MD       DISCUSSION: Kristin Benjamin is a 67 yo female who presents to PAT prior to surgery above. PMH of every day smoking, HTN, HLD, aortic atherosclerosis, COPD (by CT scan), heart murmur.  Patient with hx of heart murmur. Seen at PAT. Has soft, 1-2/6 systolic murmur in RUSB. Echo in 2015 without valvular abnormalities. Mildly thickened leaflets of aortic valve which could be etiology of mumur. Pt reports excellent functional status - can play pickleball for 3 hours. Follows with PCP for chronic medical problems. Anticipate she can proceed.   VS: BP 118/89   Pulse 87   Temp 37.1 C (Oral)   Resp 16   Ht 5\' 8"  (1.727 m)   Wt 78.9 kg   SpO2 98%   BMI 26.46 kg/m   PROVIDERS: Clayborne Dana, NP   LABS: Labs reviewed: Acceptable for surgery. (all labs ordered are listed, but only abnormal results are displayed)  Labs Reviewed  SURGICAL PCR SCREEN     IMAGES:  CT Chest 05/18/23:  IMPRESSION: 1. Lung-RADS 1S, negative. Continue annual screening with low-dose chest CT without contrast in 12 months. 2. The "S" modifier above refers to potentially clinically significant non lung cancer related findings. Specifically, there is aortic atherosclerosis, in addition to two-vessel coronary artery disease. Please note that although the presence of coronary artery calcium documents the presence of coronary artery disease, the severity of this disease and any potential stenosis cannot be assessed on this non-gated CT examination. Assessment for potential risk factor modification, dietary therapy or pharmacologic therapy may be warranted, if clinically indicated. 3. There is also mild ectasia of the ascending thoracic aorta (4.1 cm in diameter).  Continued attention at time of routine annual low-dose lung cancer screening chest CT is recommended to ensure continued stability. 4. Mild diffuse bronchial wall thickening with very mild centrilobular and paraseptal emphysema; imaging findings suggestive of underlying COPD.   Aortic Atherosclerosis (ICD10-I70.0) and Emphysema (ICD10-J43.9).  EKG 09/20/23  Normal sinus rhythm, rate 81 Anterolateral infarct , age undetermined CV:  Echo 02/23/2014:  Left ventricle:  The cavity size was normal. Systolic  function was normal. Wall motion was normal; there were no  regional wall motion abnormalities.   ------------------------------------------------------------  Aortic valve:   Trileaflet; mildly thickened leaflets.  Mobility was not restricted.  Doppler:  Transvalvular  velocity was within the normal range. There was no stenosis.   No regurgitation.   ------------------------------------------------------------  Aorta: Aortic root: The aortic root was normal in size.   ------------------------------------------------------------  Mitral valve:   Structurally normal valve.   Mobility was  not restricted.  Doppler:  Transvalvular velocity was within  the normal range. There was no evidence for stenosis.  No  regurgitation.   ------------------------------------------------------------  Left atrium:  The atrium was normal in size.   ------------------------------------------------------------  Right ventricle:  The cavity size was normal. Wall thickness  was normal. Systolic function was normal.   ------------------------------------------------------------  Pulmonic valve:    Doppler:  Transvalvular velocity was  within the normal range. There was no evidence for stenosis.    ------------------------------------------------------------  Tricuspid valve:   Structurally normal valve.    Doppler:  Transvalvular velocity was within the normal range.  No  regurgitation.    ------------------------------------------------------------  Pulmonary artery:   The main pulmonary artery was  normal-sized. Systolic pressure was within the normal range.    ------------------------------------------------------------  Right atrium:  The atrium was normal in size.   ------------------------------------------------------------  Pericardium: There was no pericardial effusion.   ------------------------------------------------------------  Systemic veins:  Inferior vena cava: The vessel was normal in size; the  respirophasic diameter changes were in the normal range (=  50%); findings are consistent with normal central venous  pressure.    Past Medical History:  Diagnosis Date   Allergy    Anxiety    Arthritis    Depression    Headache    history of   Heart murmur    in the past   Hx of adenomatous colonic polyps 07/17/2017   Hyperlipidemia    Hypertension     Past Surgical History:  Procedure Laterality Date   bladder tack     COLONOSCOPY  2006   and 2018   FUNCTIONAL ENDOSCOPIC SINUS SURGERY     JOINT REPLACEMENT     Left total knee arthroplasty Dr. Magnus Ivan 10-18-18   KNEE ARTHROSCOPY W/ MENISCAL REPAIR Right 08/2016   TOTAL KNEE ARTHROPLASTY Left 10/18/2018   Procedure: LEFT TOTAL KNEE ARTHROPLASTY;  Surgeon: Kathryne Hitch, MD;  Location: [facility];  Service: Orthopedics;  Laterality: Left;   TUBAL LIGATION  1993    MEDICATIONS:  [START ON 09/28/2023] amphetamine-dextroamphetamine (ADDERALL) 5 MG tablet   [START ON 10/27/2023] amphetamine-dextroamphetamine (ADDERALL) 5 MG tablet   [START ON 11/26/2023] amphetamine-dextroamphetamine (ADDERALL) 5 MG tablet   atorvastatin (LIPITOR) 80 MG tablet   buPROPion (WELLBUTRIN SR) 150 MG 12 hr tablet   CANNABIDIOL PO   cetirizine (ZYRTEC) 10 MG tablet   estradiol (ESTRACE VAGINAL) 0.1 MG/GM vaginal cream   sertraline (ZOLOFT) 50 MG tablet   SUMAtriptan (IMITREX) 100 MG tablet   trimethoprim  (TRIMPEX) 100 MG tablet   valsartan-hydrochlorothiazide (DIOVAN-HCT) 160-12.5 MG tablet   No current facility-administered medications for this encounter.    Marcille Blanco [facility] Short Stay/Anesthesiology [facility] Phone [phone] 09/21/2023 3:16 PM

## 2023-09-27 NOTE — H&P (Signed)
TOTAL KNEE ADMISSION H&P  Patient is being admitted for right total knee arthroplasty.  Subjective:  Chief Complaint:right knee pain.  HPI: Kristin Benjamin, 67 y.o. female, has a history of pain and functional disability in the right knee due to arthritis and has failed non-surgical conservative treatments for greater than 12 weeks to includeNSAID's and/or analgesics, corticosteriod injections, viscosupplementation injections, flexibility and strengthening excercises, and activity modification.  Onset of symptoms was gradual, starting 5 years ago with gradually worsening course since that time. The patient noted prior procedures on the knee to include  arthroscopy on the right knee(s).  Patient currently rates pain in the right knee(s) at 10 out of 10 with activity. Patient has night pain, worsening of pain with activity and weight bearing, pain that interferes with activities of daily living, pain with passive range of motion, crepitus, and joint swelling.  Patient has evidence of subchondral sclerosis, periarticular osteophytes, and joint space narrowing by imaging studies. There is no active infection.  Patient Active Problem List   Diagnosis Date Noted   Frequent UTI 06/25/2023   Coronary artery disease involving native coronary artery of native heart without angina pectoris 05/21/2023   Aortic atherosclerosis (HCC) 05/21/2023   Acute cystitis without hematuria 04/17/2023   High risk medication use 12/18/2022   Attention deficit hyperactivity disorder (ADHD) 05/18/2022   Mixed hyperlipidemia 04/27/2022   Primary hypertension 04/27/2022   Anxiety and depression 04/27/2022   Pes anserinus bursitis of right knee 02/16/2020   Status post total left knee replacement 10/18/2018   Unilateral primary osteoarthritis, right knee 03/28/2018   Chronic pain of right knee 03/28/2018   Hx of adenomatous colonic polyps 07/17/2017   Past Medical History:  Diagnosis Date   Allergy    Anxiety     Arthritis    Depression    Headache    history of   Heart murmur    in the past   Hx of adenomatous colonic polyps 07/17/2017   Hyperlipidemia    Hypertension     Past Surgical History:  Procedure Laterality Date   bladder tack     COLONOSCOPY  2006   and 2018   FUNCTIONAL ENDOSCOPIC SINUS SURGERY     JOINT REPLACEMENT     Left total knee arthroplasty Dr. Magnus Ivan 10-18-18   KNEE ARTHROSCOPY W/ MENISCAL REPAIR Right 08/2016   TOTAL KNEE ARTHROPLASTY Left 10/18/2018   Procedure: LEFT TOTAL KNEE ARTHROPLASTY;  Surgeon: Kathryne Hitch, MD;  Location[facility];  Service: Orthopedics;  Laterality: Left;   TUBAL LIGATION  1993    No current facility-administered medications for this encounter.   Current Outpatient Medications  Medication Sig Dispense Refill Last Dose   CANNABIDIOL PO Take 750 mg by mouth in the morning. CBD      cetirizine (ZYRTEC) 10 MG tablet Take 10 mg by mouth in the morning.      [START ON 09/28/2023] amphetamine-dextroamphetamine (ADDERALL) 5 MG tablet Take 1 tablet (5 mg total) by mouth daily. 30 tablet 0    [START ON 10/27/2023] amphetamine-dextroamphetamine (ADDERALL) 5 MG tablet Take 1 tablet (5 mg total) by mouth daily. 30 tablet 0    [START ON 11/26/2023] amphetamine-dextroamphetamine (ADDERALL) 5 MG tablet Take 1 tablet (5 mg total) by mouth daily. 30 tablet 0    atorvastatin (LIPITOR) 80 MG tablet Take 1 tablet (80 mg total) by mouth daily. 90 tablet 3    buPROPion (WELLBUTRIN SR) 150 MG 12 hr tablet Take 1 tablet (150 mg total)  by mouth daily. 90 tablet 3    estradiol (ESTRACE VAGINAL) 0.1 MG/GM vaginal cream Place 1 Applicatorful vaginally every other day. At night. 42.5 g 1    sertraline (ZOLOFT) 50 MG tablet Take 1 tablet (50 mg total) by mouth daily. 30 tablet 0    SUMAtriptan (IMITREX) 100 MG tablet Take 1 tablet (100 mg total) by mouth every 2 (two) hours as needed for migraine. 18 tablet 2    trimethoprim (TRIMPEX) 100 MG tablet Take 1 tablet  (100 mg total) by mouth daily as needed (after intercourse). 20 tablet 1    valsartan-hydrochlorothiazide (DIOVAN-HCT) 160-12.5 MG tablet Take 1 tablet by mouth daily. 90 tablet 1    No Known Allergies  Social History   Tobacco Use   Smoking status: Every Day    Current packs/day: 1.00    Average packs/day: 1 pack/day for 42.0 years (42.0 ttl pk-yrs)    Types: Cigarettes   Smokeless tobacco: Never   Tobacco comments:    Patient is interested in utilizing Wellbutrin and Nicotine patchs again  Substance Use Topics   Alcohol use: Yes    Alcohol/week: 3.0 - 5.0 standard drinks of alcohol    Types: 3 - 5 Glasses of wine per week    Comment: 3-5 wine per week per pt    Family History  Problem Relation Age of Onset   Hyperlipidemia Mother    Arthritis Mother    Depression Father    Hyperlipidemia Father    Diabetes Father    Alcohol abuse Father    Arthritis Father    Arthritis Sister    Hyperlipidemia Sister    Arthritis Sister    Depression Sister    Hyperlipidemia Sister    Hyperlipidemia Maternal Grandmother    Arthritis Maternal Grandmother    Stroke Maternal Grandfather    Hyperlipidemia Maternal Grandfather    Arthritis Maternal Grandfather    Hyperlipidemia Paternal Grandmother    Arthritis Paternal Grandmother    Hyperlipidemia Paternal Grandfather    Arthritis Paternal Grandfather    Colon cancer Neg Hx    Esophageal cancer Neg Hx    Rectal cancer Neg Hx    Stomach cancer Neg Hx    Colon polyps Neg Hx      Review of Systems  Objective:  Physical Exam Vitals reviewed.  Constitutional:      Appearance: Normal appearance. She is normal weight.  HENT:     Head: Normocephalic and atraumatic.  Eyes:     Extraocular Movements: Extraocular movements intact.     Pupils: Pupils are equal, round, and reactive to light.  Cardiovascular:     Rate and Rhythm: Normal rate and regular rhythm.     Pulses: Normal pulses.  Pulmonary:     Effort: Pulmonary effort is  normal.     Breath sounds: Normal breath sounds.  Abdominal:     Palpations: Abdomen is soft.  Musculoskeletal:     Cervical back: Normal range of motion and neck supple.     Right knee: Bony tenderness and crepitus present. Decreased range of motion. Tenderness present over the medial joint line and lateral joint line. Abnormal alignment and abnormal meniscus.  Neurological:     Mental Status: She is alert and oriented to person, place, and time.  Psychiatric:        Behavior: Behavior normal.     Vital signs in last 24 hours:    Labs:   Estimated body mass index is 26.46 kg/m as calculated  from the following:   Height as of 09/20/23: 5\' 8"  (1.727 m).   Weight as of 09/20/23: 78.9 kg.   Imaging Review Plain radiographs demonstrate severe degenerative joint disease of the right knee(s). The overall alignment ismild varus. The bone quality appears to be excellent for age and reported activity level.      Assessment/Plan:  End stage arthritis, right knee   The patient history, physical examination, clinical judgment of the provider and imaging studies are consistent with end stage degenerative joint disease of the right knee(s) and total knee arthroplasty is deemed medically necessary. The treatment options including medical management, injection therapy arthroscopy and arthroplasty were discussed at length. The risks and benefits of total knee arthroplasty were presented and reviewed. The risks due to aseptic loosening, infection, stiffness, patella tracking problems, thromboembolic complications and other imponderables were discussed. The patient acknowledged the explanation, agreed to proceed with the plan and consent was signed. Patient is being admitted for inpatient treatment for surgery, pain control, PT, OT, prophylactic antibiotics, VTE prophylaxis, progressive ambulation and ADL's and discharge planning. The patient is planning to be discharged home with home health  services

## 2023-09-28 ENCOUNTER — Ambulatory Visit (HOSPITAL_COMMUNITY): Payer: Medicare Other | Admitting: Certified Registered Nurse Anesthetist

## 2023-09-28 ENCOUNTER — Observation Stay (HOSPITAL_COMMUNITY)
Admission: RE | Admit: 2023-09-28 | Discharge: 2023-09-30 | Disposition: A | Discharge status: Home / Self Care | Payer: Medicare Other | Attending: Orthopaedic Surgery | Admitting: Orthopaedic Surgery

## 2023-09-28 ENCOUNTER — Encounter (HOSPITAL_COMMUNITY)
Admission: RE | Disposition: A | Discharge status: Home / Self Care | Payer: Self-pay | Source: Home / Self Care | Attending: Orthopaedic Surgery

## 2023-09-28 ENCOUNTER — Other Ambulatory Visit: Payer: Self-pay

## 2023-09-28 ENCOUNTER — Observation Stay (HOSPITAL_COMMUNITY): Payer: Medicare Other

## 2023-09-28 ENCOUNTER — Encounter (HOSPITAL_COMMUNITY): Payer: Self-pay | Admitting: Orthopaedic Surgery

## 2023-09-28 ENCOUNTER — Ambulatory Visit (HOSPITAL_COMMUNITY): Payer: Medicare Other | Admitting: Physician Assistant

## 2023-09-28 DIAGNOSIS — Z79899 Other long term (current) drug therapy: Secondary | ICD-10-CM | POA: Insufficient documentation

## 2023-09-28 DIAGNOSIS — Z96651 Presence of right artificial knee joint: Secondary | ICD-10-CM

## 2023-09-28 DIAGNOSIS — F17210 Nicotine dependence, cigarettes, uncomplicated: Secondary | ICD-10-CM | POA: Insufficient documentation

## 2023-09-28 DIAGNOSIS — G8918 Other acute postprocedural pain: Secondary | ICD-10-CM | POA: Diagnosis not present

## 2023-09-28 DIAGNOSIS — M1711 Unilateral primary osteoarthritis, right knee: Secondary | ICD-10-CM

## 2023-09-28 DIAGNOSIS — I2510 Atherosclerotic heart disease of native coronary artery without angina pectoris: Secondary | ICD-10-CM | POA: Diagnosis not present

## 2023-09-28 DIAGNOSIS — Z96652 Presence of left artificial knee joint: Secondary | ICD-10-CM | POA: Insufficient documentation

## 2023-09-28 DIAGNOSIS — I10 Essential (primary) hypertension: Secondary | ICD-10-CM | POA: Insufficient documentation

## 2023-09-28 HISTORY — PX: TOTAL KNEE ARTHROPLASTY: SHX125

## 2023-09-28 SURGERY — ARTHROPLASTY, KNEE, TOTAL
Anesthesia: Monitor Anesthesia Care | Site: Knee | Laterality: Right

## 2023-09-28 MED ORDER — SODIUM CHLORIDE 0.9 % IV SOLN: INTRAVENOUS | Status: AC

## 2023-09-28 MED ORDER — FENTANYL CITRATE PF 50 MCG/ML IJ SOSY: 25.0000 ug | PREFILLED_SYRINGE | INTRAMUSCULAR | Status: DC | PRN

## 2023-09-28 MED ORDER — SODIUM CHLORIDE 0.9 % IR SOLN
Status: DC | PRN
  Administered 2023-09-28: 1000 mL

## 2023-09-28 MED ORDER — BUPIVACAINE IN DEXTROSE 0.75-8.25 % IT SOLN
INTRATHECAL | Status: DC | PRN
  Administered 2023-09-28: 1.8 mL via INTRATHECAL

## 2023-09-28 MED ORDER — PHENYLEPHRINE HCL (PRESSORS) 10 MG/ML IV SOLN
INTRAVENOUS | Status: AC
  Filled 2023-09-28: qty 1

## 2023-09-28 MED ORDER — LACTATED RINGERS IV SOLN
INTRAVENOUS | Status: DC
Start: 2023-09-28 — End: 2023-09-28

## 2023-09-28 MED ORDER — MENTHOL 3 MG MT LOZG: 1.0000 | LOZENGE | OROMUCOSAL | Status: DC | PRN

## 2023-09-28 MED ORDER — BUPIVACAINE-EPINEPHRINE 0.25% -1:200000 IJ SOLN
INTRAMUSCULAR | Status: AC
  Filled 2023-09-28: qty 1

## 2023-09-28 MED ORDER — SERTRALINE HCL 50 MG PO TABS
50.0000 mg | ORAL_TABLET | Freq: Every day | ORAL | Status: DC
  Administered 2023-09-29 – 2023-09-30 (×2): 50 mg via ORAL
  Filled 2023-09-28 (×2): qty 1

## 2023-09-28 MED ORDER — HYDROMORPHONE HCL 1 MG/ML IJ SOLN
0.5000 mg | INTRAMUSCULAR | Status: DC | PRN
  Administered 2023-09-29: 1 mg via INTRAVENOUS
  Filled 2023-09-28: qty 1

## 2023-09-28 MED ORDER — METOCLOPRAMIDE HCL 5 MG/ML IJ SOLN: 5.0000 mg | Freq: Three times a day (TID) | INTRAMUSCULAR | Status: DC | PRN

## 2023-09-28 MED ORDER — ROPIVACAINE HCL 5 MG/ML IJ SOLN
INTRAMUSCULAR | Status: DC | PRN
  Administered 2023-09-28: 25 mL via PERINEURAL

## 2023-09-28 MED ORDER — GABAPENTIN 100 MG PO CAPS
100.0000 mg | ORAL_CAPSULE | Freq: Three times a day (TID) | ORAL | Status: DC
  Administered 2023-09-28 – 2023-09-30 (×6): 100 mg via ORAL
  Filled 2023-09-28 (×6): qty 1

## 2023-09-28 MED ORDER — ONDANSETRON HCL 4 MG PO TABS
4.0000 mg | ORAL_TABLET | Freq: Four times a day (QID) | ORAL | Status: DC | PRN
Start: 2023-09-28 — End: 2023-09-30

## 2023-09-28 MED ORDER — METOCLOPRAMIDE HCL 5 MG PO TABS: 5.0000 mg | ORAL_TABLET | Freq: Three times a day (TID) | ORAL | Status: DC | PRN

## 2023-09-28 MED ORDER — PROPOFOL 1000 MG/100ML IV EMUL
INTRAVENOUS | Status: AC
  Filled 2023-09-28: qty 100

## 2023-09-28 MED ORDER — KETOROLAC TROMETHAMINE 15 MG/ML IJ SOLN
7.5000 mg | Freq: Four times a day (QID) | INTRAMUSCULAR | Status: AC
  Administered 2023-09-28 – 2023-09-29 (×3): 7.5 mg via INTRAVENOUS
  Filled 2023-09-28 (×3): qty 1

## 2023-09-28 MED ORDER — ONDANSETRON HCL 4 MG/2ML IJ SOLN: 4.0000 mg | Freq: Four times a day (QID) | INTRAMUSCULAR | Status: DC | PRN

## 2023-09-28 MED ORDER — PHENYLEPHRINE HCL-NACL 20-0.9 MG/250ML-% IV SOLN
INTRAVENOUS | Status: DC | PRN
  Administered 2023-09-28: 50 ug/min via INTRAVENOUS

## 2023-09-28 MED ORDER — METHOCARBAMOL 500 MG PO TABS
500.0000 mg | ORAL_TABLET | Freq: Four times a day (QID) | ORAL | Status: DC | PRN
  Administered 2023-09-28 – 2023-09-30 (×7): 500 mg via ORAL
  Filled 2023-09-28 (×8): qty 1

## 2023-09-28 MED ORDER — ONDANSETRON HCL 4 MG/2ML IJ SOLN
INTRAMUSCULAR | Status: DC | PRN
  Administered 2023-09-28: 4 mg via INTRAVENOUS

## 2023-09-28 MED ORDER — PANTOPRAZOLE SODIUM 40 MG PO TBEC
40.0000 mg | DELAYED_RELEASE_TABLET | Freq: Every day | ORAL | Status: DC
Start: 2023-09-28 — End: 2023-09-30
  Administered 2023-09-29 – 2023-09-30 (×2): 40 mg via ORAL
  Filled 2023-09-28 (×2): qty 1

## 2023-09-28 MED ORDER — LIDOCAINE HCL (PF) 2 % IJ SOLN
INTRAMUSCULAR | Status: AC
  Filled 2023-09-28: qty 5

## 2023-09-28 MED ORDER — VALSARTAN-HYDROCHLOROTHIAZIDE 160-12.5 MG PO TABS: 1.0000 | ORAL_TABLET | Freq: Every day | ORAL | Status: DC

## 2023-09-28 MED ORDER — POLYETHYLENE GLYCOL 3350 17 G PO PACK: 17.0000 g | PACK | Freq: Every day | ORAL | Status: DC | PRN

## 2023-09-28 MED ORDER — LIDOCAINE 5 % EX PTCH
1.0000 | MEDICATED_PATCH | Freq: Every day | CUTANEOUS | Status: DC
  Administered 2023-09-28 – 2023-09-30 (×3): 1 via TRANSDERMAL
  Filled 2023-09-28 (×3): qty 1

## 2023-09-28 MED ORDER — METHOCARBAMOL 1000 MG/10ML IJ SOLN
500.0000 mg | Freq: Four times a day (QID) | INTRAMUSCULAR | Status: DC | PRN
Start: 2023-09-28 — End: 2023-09-30

## 2023-09-28 MED ORDER — DOCUSATE SODIUM 100 MG PO CAPS
100.0000 mg | ORAL_CAPSULE | Freq: Two times a day (BID) | ORAL | Status: DC
  Administered 2023-09-28 – 2023-09-30 (×5): 100 mg via ORAL
  Filled 2023-09-28 (×5): qty 1

## 2023-09-28 MED ORDER — MIDAZOLAM HCL 2 MG/2ML IJ SOLN
1.0000 mg | INTRAMUSCULAR | Status: DC
  Administered 2023-09-28: 2 mg via INTRAVENOUS
  Administered 2023-09-28: 1 mg via INTRAVENOUS
  Filled 2023-09-28: qty 2

## 2023-09-28 MED ORDER — BUPIVACAINE-EPINEPHRINE 0.25% -1:200000 IJ SOLN
INTRAMUSCULAR | Status: DC | PRN
  Administered 2023-09-28: 30 mL

## 2023-09-28 MED ORDER — ALUM & MAG HYDROXIDE-SIMETH 200-200-20 MG/5ML PO SUSP: 30.0000 mL | ORAL | Status: DC | PRN

## 2023-09-28 MED ORDER — OXYCODONE HCL 5 MG/5ML PO SOLN: 5.0000 mg | Freq: Once | ORAL | Status: DC | PRN

## 2023-09-28 MED ORDER — DEXAMETHASONE SODIUM PHOSPHATE 10 MG/ML IJ SOLN
INTRAMUSCULAR | Status: DC | PRN
  Administered 2023-09-28: 10 mg via INTRAVENOUS

## 2023-09-28 MED ORDER — MIDAZOLAM HCL 2 MG/2ML IJ SOLN
INTRAMUSCULAR | Status: AC
  Filled 2023-09-28: qty 2

## 2023-09-28 MED ORDER — FENTANYL CITRATE (PF) 100 MCG/2ML IJ SOLN
INTRAMUSCULAR | Status: AC
  Filled 2023-09-28: qty 2

## 2023-09-28 MED ORDER — OXYCODONE HCL 5 MG PO TABS: 5.0000 mg | ORAL_TABLET | Freq: Once | ORAL | Status: DC | PRN

## 2023-09-28 MED ORDER — OXYCODONE HCL 5 MG PO TABS
5.0000 mg | ORAL_TABLET | ORAL | Status: DC | PRN
  Administered 2023-09-28 – 2023-09-30 (×4): 10 mg via ORAL
  Filled 2023-09-28 (×6): qty 2

## 2023-09-28 MED ORDER — ONDANSETRON HCL 4 MG/2ML IJ SOLN
INTRAMUSCULAR | Status: AC
Start: 2023-09-28 — End: ?
  Filled 2023-09-28: qty 2

## 2023-09-28 MED ORDER — CEFAZOLIN SODIUM-DEXTROSE 2-4 GM/100ML-% IV SOLN
2.0000 g | INTRAVENOUS | Status: AC
  Administered 2023-09-28: 2 g via INTRAVENOUS
  Filled 2023-09-28: qty 100

## 2023-09-28 MED ORDER — ACETAMINOPHEN 325 MG PO TABS
325.0000 mg | ORAL_TABLET | Freq: Four times a day (QID) | ORAL | Status: DC | PRN
  Administered 2023-09-29: 650 mg via ORAL
  Administered 2023-09-29: 325 mg via ORAL
  Administered 2023-09-30: 650 mg via ORAL
  Filled 2023-09-28 (×3): qty 2

## 2023-09-28 MED ORDER — 0.9 % SODIUM CHLORIDE (POUR BTL) OPTIME
TOPICAL | Status: DC | PRN
  Administered 2023-09-28: 1000 mL

## 2023-09-28 MED ORDER — PROPOFOL 500 MG/50ML IV EMUL
INTRAVENOUS | Status: DC | PRN
  Administered 2023-09-28: 100 ug/kg/min via INTRAVENOUS

## 2023-09-28 MED ORDER — PHENYLEPHRINE HCL (PRESSORS) 10 MG/ML IV SOLN
INTRAVENOUS | Status: DC | PRN
  Administered 2023-09-28 (×10): 80 ug via INTRAVENOUS

## 2023-09-28 MED ORDER — ORAL CARE MOUTH RINSE: 15.0000 mL | Freq: Once | OROMUCOSAL | Status: AC

## 2023-09-28 MED ORDER — MIDAZOLAM HCL 2 MG/2ML IJ SOLN
INTRAMUSCULAR | Status: AC
Start: 2023-09-28 — End: ?
  Filled 2023-09-28: qty 2

## 2023-09-28 MED ORDER — OXYCODONE HCL 5 MG PO TABS
10.0000 mg | ORAL_TABLET | ORAL | Status: DC | PRN
  Administered 2023-09-28: 15 mg via ORAL
  Administered 2023-09-28: 10 mg via ORAL
  Administered 2023-09-29 (×3): 15 mg via ORAL
  Administered 2023-09-29: 10 mg via ORAL
  Administered 2023-09-29: 15 mg via ORAL
  Administered 2023-09-30: 10 mg via ORAL
  Filled 2023-09-28 (×3): qty 3
  Filled 2023-09-28 (×2): qty 2
  Filled 2023-09-28 (×2): qty 3

## 2023-09-28 MED ORDER — ASPIRIN 81 MG PO CHEW
81.0000 mg | CHEWABLE_TABLET | Freq: Two times a day (BID) | ORAL | Status: DC
  Administered 2023-09-28 – 2023-09-30 (×4): 81 mg via ORAL
  Filled 2023-09-28 (×4): qty 1

## 2023-09-28 MED ORDER — POVIDONE-IODINE 10 % EX SWAB: 2.0000 | Freq: Once | CUTANEOUS | Status: DC

## 2023-09-28 MED ORDER — IRBESARTAN 150 MG PO TABS
150.0000 mg | ORAL_TABLET | Freq: Every day | ORAL | Status: DC
  Administered 2023-09-29: 150 mg via ORAL
  Filled 2023-09-28 (×2): qty 1

## 2023-09-28 MED ORDER — DIPHENHYDRAMINE HCL 12.5 MG/5ML PO ELIX: 12.5000 mg | ORAL_SOLUTION | ORAL | Status: DC | PRN

## 2023-09-28 MED ORDER — FENTANYL CITRATE PF 50 MCG/ML IJ SOSY
50.0000 ug | PREFILLED_SYRINGE | INTRAMUSCULAR | Status: DC
  Administered 2023-09-28: 50 ug via INTRAVENOUS
  Filled 2023-09-28: qty 2

## 2023-09-28 MED ORDER — BUPROPION HCL ER (SR) 150 MG PO TB12
150.0000 mg | ORAL_TABLET | Freq: Every day | ORAL | Status: DC
  Administered 2023-09-29 – 2023-09-30 (×2): 150 mg via ORAL
  Filled 2023-09-28 (×2): qty 1

## 2023-09-28 MED ORDER — LACTATED RINGERS IV SOLN: INTRAVENOUS | Status: DC | PRN

## 2023-09-28 MED ORDER — PHENOL 1.4 % MT LIQD: 1.0000 | OROMUCOSAL | Status: DC | PRN

## 2023-09-28 MED ORDER — TRANEXAMIC ACID-NACL 1000-0.7 MG/100ML-% IV SOLN
1000.0000 mg | INTRAVENOUS | Status: AC
  Administered 2023-09-28: 1000 mg via INTRAVENOUS
  Filled 2023-09-28: qty 100

## 2023-09-28 MED ORDER — AMPHETAMINE-DEXTROAMPHETAMINE 10 MG PO TABS: 5.0000 mg | ORAL_TABLET | Freq: Every day | ORAL | Status: DC

## 2023-09-28 MED ORDER — HYDROCHLOROTHIAZIDE 12.5 MG PO TABS
12.5000 mg | ORAL_TABLET | Freq: Every day | ORAL | Status: DC
  Administered 2023-09-29: 12.5 mg via ORAL
  Filled 2023-09-28 (×2): qty 1

## 2023-09-28 MED ORDER — CHLORHEXIDINE GLUCONATE 0.12 % MT SOLN
15.0000 mL | Freq: Once | OROMUCOSAL | Status: AC
  Administered 2023-09-28: 15 mL via OROMUCOSAL

## 2023-09-28 MED ORDER — CEFAZOLIN SODIUM-DEXTROSE 1-4 GM/50ML-% IV SOLN
1.0000 g | Freq: Four times a day (QID) | INTRAVENOUS | Status: AC
  Administered 2023-09-28 (×2): 1 g via INTRAVENOUS
  Filled 2023-09-28 (×2): qty 50

## 2023-09-28 MED ORDER — DEXAMETHASONE SODIUM PHOSPHATE 10 MG/ML IJ SOLN
INTRAMUSCULAR | Status: AC
  Filled 2023-09-28: qty 1

## 2023-09-28 MED ORDER — FENTANYL CITRATE (PF) 100 MCG/2ML IJ SOLN
INTRAMUSCULAR | Status: DC | PRN
  Administered 2023-09-28 (×2): 50 ug via INTRAVENOUS

## 2023-09-28 SURGICAL SUPPLY — 57 items
BAG COUNTER SPONGE SURGICOUNT (BAG) IMPLANT
BAG ZIPLOCK 12X15 (MISCELLANEOUS) ×1 IMPLANT
BENZOIN TINCTURE PRP APPL 2/3 (GAUZE/BANDAGES/DRESSINGS) IMPLANT
BLADE SAG 18X100X1.27 (BLADE) ×1 IMPLANT
BLADE SURG SZ10 CARB STEEL (BLADE) ×2 IMPLANT
BNDG ELASTIC 6INX 5YD STR LF (GAUZE/BANDAGES/DRESSINGS) ×2 IMPLANT
BOWL SMART MIX CTS (DISPOSABLE) IMPLANT
CEMENT BONE R 1X40 (Cement) IMPLANT
COMP FEM STD PS 8 RT (Joint) ×1 IMPLANT
COMP PATELLA PEG 3 32 (Joint) ×1 IMPLANT
COMPONENT FEM STD PS 8 RT (Joint) IMPLANT
COMPONENT PATELLA PEG 3 32 (Joint) IMPLANT
COOLER ICEMAN CLASSIC (MISCELLANEOUS) ×1 IMPLANT
COVER SURGICAL LIGHT HANDLE (MISCELLANEOUS) ×1 IMPLANT
CUFF TOURN SGL QUICK 34 (TOURNIQUET CUFF) ×1
CUFF TRNQT CYL 34X4.125X (TOURNIQUET CUFF) ×1 IMPLANT
DRAPE INCISE IOBAN 66X45 STRL (DRAPES) ×1 IMPLANT
DRAPE U-SHAPE 47X51 STRL (DRAPES) ×1 IMPLANT
DURAPREP 26ML APPLICATOR (WOUND CARE) ×1 IMPLANT
ELECT BLADE TIP CTD 4 INCH (ELECTRODE) ×1 IMPLANT
ELECT REM PT RETURN 15FT ADLT (MISCELLANEOUS) ×1 IMPLANT
GAUZE PAD ABD 8X10 STRL (GAUZE/BANDAGES/DRESSINGS) ×2 IMPLANT
GAUZE SPONGE 4X4 12PLY STRL (GAUZE/BANDAGES/DRESSINGS) ×1 IMPLANT
GAUZE XEROFORM 1X8 LF (GAUZE/BANDAGES/DRESSINGS) IMPLANT
GLOVE BIO SURGEON STRL SZ7.5 (GLOVE) ×1 IMPLANT
GLOVE BIOGEL PI IND STRL 8 (GLOVE) ×2 IMPLANT
GLOVE ECLIPSE 8.0 STRL XLNG CF (GLOVE) ×1 IMPLANT
GOWN STRL REUS W/ TWL XL LVL3 (GOWN DISPOSABLE) ×2 IMPLANT
GOWN STRL REUS W/TWL XL LVL3 (GOWN DISPOSABLE) ×2
HANDPIECE INTERPULSE COAX TIP (DISPOSABLE) ×1
HOLDER FOLEY CATH W/STRAP (MISCELLANEOUS) IMPLANT
IMMOBILIZER KNEE 20 (SOFTGOODS) ×1
IMMOBILIZER KNEE 20 THIGH 36 (SOFTGOODS) ×1 IMPLANT
INSERT TIB ASF SZ8-11 14 RT (Insert) IMPLANT
KIT TURNOVER KIT A (KITS) IMPLANT
NS IRRIG 1000ML POUR BTL (IV SOLUTION) ×1 IMPLANT
PACK TOTAL KNEE CUSTOM (KITS) ×1 IMPLANT
PAD COLD SHLDR WRAP-ON (PAD) ×1 IMPLANT
PADDING CAST COTTON 6X4 STRL (CAST SUPPLIES) ×2 IMPLANT
PROS TIB KNEE PS 0D F RT (Joint) ×1 IMPLANT
PROSTHESIS TIB KNEE PS 0D F RT (Joint) IMPLANT
PROTECTOR NERVE ULNAR (MISCELLANEOUS) ×1 IMPLANT
SCREW FEMALE HEX FIX 25X2.5 (ORTHOPEDIC DISPOSABLE SUPPLIES) IMPLANT
SET HNDPC FAN SPRY TIP SCT (DISPOSABLE) ×1 IMPLANT
SET PAD KNEE POSITIONER (MISCELLANEOUS) ×1 IMPLANT
SPIKE FLUID TRANSFER (MISCELLANEOUS) IMPLANT
STAPLER SKIN PROX WIDE 3.9 (STAPLE) IMPLANT
STRIP CLOSURE SKIN 1/2X4 (GAUZE/BANDAGES/DRESSINGS) IMPLANT
SUT MNCRL AB 4-0 PS2 18 (SUTURE) IMPLANT
SUT VIC AB 0 CT1 27 (SUTURE) ×2
SUT VIC AB 0 CT1 27XBRD ANTBC (SUTURE) ×1 IMPLANT
SUT VIC AB 1 CT1 36 (SUTURE) ×2 IMPLANT
SUT VIC AB 2-0 CT1 27 (SUTURE) ×2
SUT VIC AB 2-0 CT1 TAPERPNT 27 (SUTURE) ×2 IMPLANT
TRAY FOLEY MTR SLVR 14FR STAT (SET/KITS/TRAYS/PACK) IMPLANT
TRAY FOLEY MTR SLVR 16FR STAT (SET/KITS/TRAYS/PACK) IMPLANT
WATER STERILE IRR 1000ML POUR (IV SOLUTION) ×2 IMPLANT

## 2023-09-28 NOTE — Evaluation (Signed)
Physical Therapy Evaluation Patient Details Name: Kristin Benjamin MRN: [id number] DOB: June 07, 1956 Today's Date: 09/28/2023  History of Present Illness  Pt s/p R TKR and iwht hx of CAD, ADHD and L TKR(19)  Clinical Impression  Pt s/p R TKR and presents with decreased R LE strength/ROM, post op pain and orthostatic hypotension on day of eval limiting functional mobility.  Pt should progress to dc home with family assist.        If plan is discharge home, recommend the following:     Can travel by private vehicle        Equipment Recommendations None recommended by PT  Recommendations for Other Services       Functional Status Assessment Patient has had a recent decline in their functional status and demonstrates the ability to make significant improvements in function in a reasonable and predictable amount of time.     Precautions / Restrictions Precautions Precautions: Knee;Fall Required Braces or Orthoses: Knee Immobilizer - Right Knee Immobilizer - Right: Discontinue once straight leg raise with < 10 degree lag Restrictions Weight Bearing Restrictions: No Other Position/Activity Restrictions: WAT      Mobility  Bed Mobility Overal bed mobility: Needs Assistance Bed Mobility: Supine to Sit     Supine to sit: Min assist     General bed mobility comments: INcreased time with cues for sequence and assist to manage R LE.    Transfers Overall transfer level: Needs assistance Equipment used: Rolling walker (2 wheels) Transfers: Sit to/from Stand Sit to Stand: Min assist, From elevated surface           General transfer comment: cues for LE management and use of UEs to self assist    Ambulation/Gait Ambulation/Gait assistance: Min assist, Mod assist Gait Distance (Feet): 3 Feet Assistive device: Rolling walker (2 wheels) Gait Pattern/deviations: Step-to pattern, Decreased step length - right, Decreased step length - left, Shuffle, Trunk flexed Gait  velocity: decr     General Gait Details: cues for sequence, posture and position from RW; distance ltd by c/o increasing dizziness  Stairs            Wheelchair Mobility     Tilt Bed    Modified Rankin (Stroke Patients Only)       Balance Overall balance assessment: Needs assistance Sitting-balance support: No upper extremity supported, Feet supported Sitting balance-Leahy Scale: Good     Standing balance support: Bilateral upper extremity supported Standing balance-Leahy Scale: Poor                               Pertinent Vitals/Pain Pain Assessment Pain Assessment: 0-10 Pain Score: 7  Pain Location: R knee and Sciatica Pain Descriptors / Indicators: Aching, Sore Pain Intervention(s): Limited activity within patient's tolerance, Monitored during session, Premedicated before session, Ice applied    Home Living Family/patient expects to be discharged to:: Private residence Living Arrangements: Spouse/significant other Available Help at Discharge: Family;Available 24 hours/day Type of Home: House Home Access: Stairs to enter Entrance Stairs-Rails: Right Entrance Stairs-Number of Steps: 4+1 Alternate Level Stairs-Number of Steps: 14 Home Layout: Two level Home Equipment: Agricultural consultant (2 wheels);Cane - single point;BSC/3in1      Prior Function Prior Level of Function : Independent/Modified Independent                     Extremity/Trunk Assessment   Upper Extremity Assessment Upper Extremity Assessment: Overall WFL for tasks assessed  Lower Extremity Assessment Lower Extremity Assessment: RLE deficits/detail    Cervical / Trunk Assessment Cervical / Trunk Assessment: Normal  Communication   Communication Communication: No apparent difficulties  Cognition Arousal: Alert Behavior During Therapy: WFL for tasks assessed/performed Overall Cognitive Status: Within Functional Limits for tasks assessed                                           General Comments      Exercises Total Joint Exercises Ankle Circles/Pumps: AROM, Both, 15 reps, Supine   Assessment/Plan    PT Assessment Patient needs continued PT services  PT Problem List Decreased strength;Decreased range of motion;Decreased activity tolerance;Decreased balance;Decreased mobility;Decreased knowledge of use of DME;Pain       PT Treatment Interventions DME instruction;Gait training;Stair training;Functional mobility training;Therapeutic activities;Therapeutic exercise;Balance training;Patient/family education    PT Goals (Current goals can be found in the Care Plan section)  Acute Rehab PT Goals Patient Stated Goal: Regain IND PT Goal Formulation: With patient Time For Goal Achievement: 10/12/23 Potential to Achieve Goals: Good    Frequency 7X/week     Co-evaluation               AM-PAC PT "6 Clicks" Mobility  Outcome Measure Help needed turning from your back to your side while in a flat bed without using bedrails?: A Little Help needed moving from lying on your back to sitting on the side of a flat bed without using bedrails?: A Little Help needed moving to and from a bed to a chair (including a wheelchair)?: A Lot Help needed standing up from a chair using your arms (e.g., wheelchair or bedside chair)?: A Lot Help needed to walk in hospital room?: Total Help needed climbing 3-5 steps with a railing? : Total 6 Click Score: 12    End of Session Equipment Utilized During Treatment: Gait belt;Right knee immobilizer Activity Tolerance: Patient tolerated treatment well;Other (comment) (orthostatic) Patient left: in chair;with call bell/phone within reach;with chair alarm set;with family/visitor present Nurse Communication: Mobility status PT Visit Diagnosis: Difficulty in walking, not elsewhere classified (R26.2)    Time: 1500-1540 PT Time Calculation (min) (ACUTE ONLY): 40 min   Charges:   PT Evaluation $PT  Eval Low Complexity: 1 Low PT Treatments $Therapeutic Activity: 8-22 mins PT General Charges $$ ACUTE PT VISIT: 1 Visit         Mauro Kaufmann PT Acute Rehabilitation Services Pager [phone] Office [phone]   Deshonda Cryderman 09/28/2023, 5:19 PM

## 2023-09-28 NOTE — Plan of Care (Signed)

## 2023-09-28 NOTE — Anesthesia Procedure Notes (Signed)
Spinal  Patient location during procedure: OR Start time: 09/28/2023 8:45 AM End time: 09/28/2023 8:47 AM Reason for block: surgical anesthesia Staffing Performed: anesthesiologist  Anesthesiologist: Achille Rich, MD Performed by: Achille Rich, MD Authorized by: Achille Rich, MD   Preanesthetic Checklist Completed: patient identified, IV checked, risks and benefits discussed, surgical consent, monitors and equipment checked, pre-op evaluation and timeout performed Spinal Block Patient position: sitting Prep: DuraPrep Patient monitoring: cardiac monitor, continuous pulse ox and blood pressure Approach: midline Location: L3-4 Injection technique: single-shot Needle Needle type: Pencan  Needle gauge: 24 G Needle length: 9 cm Assessment Sensory level: T10 Events: CSF return Additional Notes Functioning IV was confirmed and monitors were applied. Sterile prep and drape, including hand hygiene and sterile gloves were used. The patient was positioned and the spine was prepped. The skin was anesthetized with lidocaine.  Free flow of clear CSF was obtained prior to injecting local anesthetic into the CSF.  The spinal needle aspirated freely following injection.  The needle was carefully withdrawn.  The patient tolerated the procedure well.

## 2023-09-28 NOTE — Transfer of Care (Signed)
Immediate Anesthesia Transfer of Care Note  Patient: Kristin Benjamin  Procedure(s) Performed: RIGHT TOTAL KNEE ARTHROPLASTY (Right: Knee)  Patient Location: PACU  Anesthesia Type:MAC and Spinal  Level of Consciousness: awake, alert , and oriented  Airway & Oxygen Therapy: Patient Spontanous Breathing  Post-op Assessment: Report given to RN and Post -op Vital signs reviewed and stable  Post vital signs: Reviewed and stable  Last Vitals:  Vitals Value Taken Time  BP 105/71 09/28/23 1023  Temp    Pulse 77 09/28/23 1025  Resp 16 09/28/23 1025  SpO2 93 % 09/28/23 1025  Vitals shown include unfiled device data.  Last Pain:  Vitals:   09/28/23 0803  TempSrc:   PainSc: 0-No pain      Patients Stated Pain Goal: 5 (09/28/23 0618)  Complications: No notable events documented.

## 2023-09-28 NOTE — Interval H&P Note (Signed)
History and Physical Interval Note: The patient understands that she is here today for a right total knee replacement to treat her significant right knee arthritis.  There has been no acute or interval change in her medical status.  The risks and benefits of surgery have been discussed in detail and informed consent has been obtained.  The right operative knee has been marked.  09/28/2023 6:56 AM  Kristin Benjamin  has presented today for surgery, with the diagnosis of Right Knee Osteoarthritis.  The various methods of treatment have been discussed with the patient and family. After consideration of risks, benefits and other options for treatment, the patient has consented to  Procedure(s): RIGHT TOTAL KNEE ARTHROPLASTY (Right) as a surgical intervention.  The patient's history has been reviewed, patient examined, no change in status, stable for surgery.  I have reviewed the patient's chart and labs.  Questions were answered to the patient's satisfaction.     Kathryne Hitch

## 2023-09-28 NOTE — TOC Transition Note (Signed)
Transition of Care [facility]) - CM/SW Discharge Note  Patient Details  Name: Kristin Benjamin MRN: [id number] Date of Birth: 13-May-1956  Transition of Care [facility]) CM/SW Contact:  Ewing Schlein, LCSW Phone Number: 09/28/2023, 1:15 PM  Clinical Narrative: Patient is expected to discharge home this weekend after passing PT. CSW met with patient and spouse, Angila Lapietra, regarding discharge plan. Patient will go home with HHPT, which was prearranged with [facility] before surgery. Patient confirmed she has a rolling walker at home, so there are no DME needs at this time. TOC signing off.  Final next level of care: Home w Home Health Services Barriers to Discharge: No Barriers Identified  Patient Goals and CMS Choice CMS Medicare.gov Compare Post Acute Care list provided to:: Patient Choice offered to / list presented to : Patient  Discharge Plan and Services Additional resources added to the After Visit Summary for           DME Arranged: N/A DME Agency: NA HH Arranged: PT HH Agency: [facility] Representative spoke with at [facility] Agency: Prearranged in orthopedist's office  Social Determinants of Health (SDOH) Interventions SDOH Screenings   Food Insecurity: No Food Insecurity (09/28/2023)  Housing: Low Risk  (09/28/2023)  Transportation Needs: No Transportation Needs (09/28/2023)  Utilities: Not At Risk (09/28/2023)  Alcohol Screen: Low Risk  (03/12/2023)  Depression (PHQ2-9): Low Risk  (09/18/2023)  Financial Resource Strain: Patient Declined (03/12/2023)  Physical Activity: Sufficiently Active (03/12/2023)  Social Connections: Unknown (03/12/2023)  Stress: Stress Concern Present (03/12/2023)  Tobacco Use: High Risk (09/28/2023)   Readmission Risk Interventions     No data to display

## 2023-09-28 NOTE — Op Note (Signed)
Operative Note  Date of operation: 09/28/2023 Preoperative diagnosis: Right primary knee osteoarthritis Postoperative diagnosis: Same  Procedure: Right press-fit total knee arthroplasty  Implants: Biomet/Zimmer persona press-fit knee system Implant Name Type Inv. Item Serial No. Manufacturer Lot No. LRB No. Used Action  INSERT TIB ASF SZ8-11 14 RT - [id number] Insert INSERT TIB ASF SZ8-11 14 RT  ZIMMER RECON(ORTH,TRAU,BIO,SG) [id number] Right 1 Implanted  COMP FEM STD PS 8 RT - [id number] Joint COMP FEM STD PS 8 RT  ZIMMER RECON(ORTH,TRAU,BIO,SG) [id number] Right 1 Implanted  PROS TIB KNEE PS 0D F RT - [id number] Joint PROS TIB KNEE PS 0D F RT  ZIMMER RECON(ORTH,TRAU,BIO,SG) [id number] Right 1 Implanted  COMP PATELLA PEG 3 32 - [id number] Joint COMP PATELLA PEG 3 32  ZIMMER RECON(ORTH,TRAU,BIO,SG) [id number] Right 1 Implanted   Surgeon: Vanita Panda. Magnus Ivan, MD Assistant: Rexene Edison, PA-C  Anesthesia: #1 right lower extremity adductor canal block, #2 spinal, #3 local Tourniquet time: Less than 1 hour EBL: Less than 100 cc Antibiotics: IV Ancef Complications: None  Indications: The patient is a 67 year old active female with debilitating and stage arthritis involving her right knee that has been well-documented with clinical exam and x-ray findings.  She has tried and failed conservative treatment for over a year now.  We actually replaced her left knee about 5 to 6 years ago and that is done well.  At this point her right knee pain is daily and it is detrimentally affecting her mobility, her quality life, and her activities of daily living to the point she wished to proceed with a knee replacement on the right side and we agree with this as well.  Having had this before she is fully aware of the risk of acute blood loss anemia, nerve or vessel injury, fracture, infection, DVT, implant failure and wound healing issues.  Procedure description: After informed consent was obtained and the  appropriate right knee was marked, anesthesia obtained a right lower extremity adductor canal block in the holding room.  The patient was then brought to the operating room and set up on the operating table where spinal anesthesia was obtained.  She was then laid in supine position on the operating table and a Foley catheter was placed.  A nonsterile tourniquet was placed around her upper right thigh and her right thigh, knee, leg and ankle were prepped and draped with DuraPrep and sterile drapes including a sterile stockinette.  A timeout was called and she was identified as the correct patient the correct right knee.  An Esmarch was then used to wrap out the knee and the tourniquet was inflated to 300 mm of pressure.  With the knee in an extended position we made a direct midline incision over the patella and carried this proximally distally.  Dissection was carried down to the knee joint and a medial parapatellar arthrotomy was made.  There was a large joint effusion encountered.  We found complete cartilage loss throughout the knee.  It was even much worse than the x-ray showed.  Osteophytes were removed from all 3 compartments as well as remnants of the ACL and medial lateral meniscus.  Using an extramedullary cutting guide for making her proximal tibia cut with the knee in a flexed position we were able to make this cut taking 2 mm off the low side.  We did back down to more millimeters.  We then used an intramedullary cutting guide for distal femur cut with the alignment through the notch of the femur.  We  made this for a right knee with a 5 degrees externally rotated block with a 10 mm cut.  We made that cut without difficulty and brought the knee back down to full extension and she was fully extended with a 12 mm block.  We then went back to the femur and put a femoral sizing guide based off the epicondylar axis.  Based off of this we chose a size 8 femur.  We put a 4-in-1 cutting block for a size 8 femur  and made our anterior and posterior cuts followed our chamfer cuts.  We then backed the tibia and chose a size F right tibial tray for coverage over the tibial plateau setting the rotation of the tibial tubercle and the femur.  We did our drill hole and keel punch over this and found such good quality bone we proceeded with the press-fit implants.  We trialed our size F right tibia combined with our size 8 right CR standard femur.  We trialed up to a 14 mm thickness medial congruent polythene insert we are pleased with range of motion and stability without insert.  We then made a patella cut and drilled 3 holes for a size 32 press-fit patella button.  We then removed all trial instrumentation from the knee and irrigate the knee of normal saline solution.  We then placed our Marcaine with epinephrine around the arthrotomy.  Next with the knee in a flexed position we dried the knee roll well and placed our real Biomet/Zimmer persona tibial tray press-fit for a right knee size F followed by press fitting our size 8 right CR standard femur.  We placed our 14 mm medial congruent polythene insert for right knee and press-fit our size 32 patella button.  We did again put the knee through several cycles of motion and we are pleased with range of motion and stability.  We then closed the arthrotomy with interrupted #1 Vicryl suture.  Prior to closing the arthrotomy the tourniquet was let down and hemostasis was obtained with electrocautery.  The deep tissue was closed with 0 Vicryl followed by 2-0 Vicryl close subcutaneous tissue and the skin was closed with staples.  Well-padded sterile dressings applied.  The patient was taken the recovery room in stable condition.  Rexene Edison, PA-C did assist in the entire case and beginning to end and his assistance was crucial and medically necessary for soft tissue management and retraction, helping guide implant placement and a layered closure of the wound.

## 2023-09-28 NOTE — Anesthesia Procedure Notes (Signed)
Anesthesia Regional Block: Adductor canal block   Pre-Anesthetic Checklist: , timeout performed,  Correct Patient, Correct Site, Correct Laterality,  Correct Procedure, Correct Position, site marked,  Risks and benefits discussed,  Surgical consent,  Pre-op evaluation,  At surgeon's request and post-op pain management  Laterality: Right  Prep: chloraprep       Needles:  Injection technique: Single-shot  Needle Type: Echogenic Needle     Needle Length: 9cm  Needle Gauge: 21     Additional Needles:   Narrative:  Start time: 09/28/2023 8:01 AM End time: 09/28/2023 8:05 AM Injection made incrementally with aspirations every 5 mL.  Performed by: Personally  Anesthesiologist: Achille Rich, MD  Additional Notes: Pt tolerated the procedure well.

## 2023-09-28 NOTE — Anesthesia Procedure Notes (Signed)
Procedure Name: MAC Date/Time: 09/28/2023 8:45 AM  Performed by: Cleda Clarks, CRNAPre-anesthesia Checklist: Patient identified, Emergency Drugs available, Suction available, Patient being monitored and Timeout performed Patient Re-evaluated:Patient Re-evaluated prior to induction Oxygen Delivery Method: Simple face mask Placement Confirmation: positive ETCO2

## 2023-09-29 DIAGNOSIS — Z96652 Presence of left artificial knee joint: Secondary | ICD-10-CM | POA: Diagnosis not present

## 2023-09-29 DIAGNOSIS — I10 Essential (primary) hypertension: Secondary | ICD-10-CM | POA: Diagnosis not present

## 2023-09-29 DIAGNOSIS — M1711 Unilateral primary osteoarthritis, right knee: Secondary | ICD-10-CM | POA: Diagnosis not present

## 2023-09-29 DIAGNOSIS — I2510 Atherosclerotic heart disease of native coronary artery without angina pectoris: Secondary | ICD-10-CM | POA: Diagnosis not present

## 2023-09-29 DIAGNOSIS — Z79899 Other long term (current) drug therapy: Secondary | ICD-10-CM | POA: Diagnosis not present

## 2023-09-29 DIAGNOSIS — F17210 Nicotine dependence, cigarettes, uncomplicated: Secondary | ICD-10-CM | POA: Diagnosis not present

## 2023-09-29 LAB — BASIC METABOLIC PANEL
Anion gap: 8 (ref 5–15)
BUN: 24 mg/dL — ABNORMAL HIGH (ref 8–23)
CO2: 23 mmol/L (ref 22–32)
Calcium: 8.5 mg/dL — ABNORMAL LOW (ref 8.9–10.3)
Chloride: 101 mmol/L (ref 98–111)
Creatinine, Ser: 0.67 mg/dL (ref 0.44–1.00)
GFR, Estimated: >60 mL/min (ref >60)
Glucose, Bld: 109 mg/dL — ABNORMAL HIGH (ref 70–99)
Potassium: 3.6 mmol/L (ref 3.5–5.1)
Sodium: 132 mmol/L — ABNORMAL LOW (ref 135–145)

## 2023-09-29 LAB — CBC
HCT: 35.9 % — ABNORMAL LOW (ref 36.0–46.0)
Hemoglobin: 11.9 g/dL — ABNORMAL LOW (ref 12.0–15.0)
MCH: 32.9 pg (ref 26.0–34.0)
MCHC: 33.1 g/dL (ref 30.0–36.0)
MCV: 99.2 fL (ref 80.0–100.0)
Platelets: 269 10*3/uL (ref 150–400)
RBC: 3.62 MIL/uL — ABNORMAL LOW (ref 3.87–5.11)
RDW: 11.8 % (ref 11.5–15.5)
WBC: 11.7 10*3/uL — ABNORMAL HIGH (ref 4.0–10.5)
nRBC: 0 % (ref 0.0–0.2)

## 2023-09-29 MED ORDER — OXYCODONE HCL 5 MG PO TABS: 5.0000 mg | ORAL_TABLET | Freq: Four times a day (QID) | ORAL | 0 refills | Status: DC | PRN

## 2023-09-29 MED ORDER — GABAPENTIN 100 MG PO CAPS: 100.0000 mg | ORAL_CAPSULE | Freq: Three times a day (TID) | ORAL | 0 refills | Status: DC | PRN

## 2023-09-29 MED ORDER — ASPIRIN 81 MG PO CHEW: 81.0000 mg | CHEWABLE_TABLET | Freq: Two times a day (BID) | ORAL | 0 refills | Status: DC

## 2023-09-29 MED ORDER — TIZANIDINE HCL 4 MG PO TABS: 4.0000 mg | ORAL_TABLET | Freq: Four times a day (QID) | ORAL | 0 refills | Status: DC | PRN

## 2023-09-29 NOTE — Progress Notes (Signed)
Patient is being encouraged to drink plenty of water after having some symptomatic orthostatic hypotension with her second PT session and has been complying with this throughout the day.  She voiced understanding regarding the need to increase fluid intake.

## 2023-09-29 NOTE — Plan of Care (Signed)
  Problem: Coping: Goal: Level of anxiety will decrease Outcome: Progressing   Problem: Pain Management: Goal: General experience of comfort will improve Outcome: Progressing   Problem: Safety: Goal: Ability to remain free from injury will improve Outcome: Progressing

## 2023-09-29 NOTE — Discharge Summary (Signed)
Patient ID: Kristin Benjamin MRN: [id number] DOB/AGE: Nov 23, 1955 67 y.o.  Admit date: 09/28/2023 Discharge date: 09/29/2023  Admission Diagnoses:  Principal Problem:   Unilateral primary osteoarthritis, right knee Active Problems:   Status post total right knee replacement   Discharge Diagnoses:  Same  Past Medical History:  Diagnosis Date   Allergy    Anxiety    Arthritis    Depression    Headache    history of   Heart murmur    in the past   Hx of adenomatous colonic polyps 07/17/2017   Hyperlipidemia    Hypertension     Surgeries: Procedure(s): RIGHT TOTAL KNEE ARTHROPLASTY on 09/28/2023   Consultants:   Discharged Condition: Improved  Hospital Course: Kristin Benjamin is an 67 y.o. female who was admitted 09/28/2023 for operative treatment ofUnilateral primary osteoarthritis, right knee. Patient has severe unremitting pain that affects sleep, daily activities, and work/hobbies. After pre-op clearance the patient was taken to the operating room on 09/28/2023 and underwent  Procedure(s): RIGHT TOTAL KNEE ARTHROPLASTY.    Patient was given perioperative antibiotics:  Anti-infectives (From admission, onward)    Start     Dose/Rate Route Frequency Ordered Stop   09/28/23 1500  ceFAZolin (ANCEF) IVPB 1 g/50 mL premix        1 g 100 mL/hr over 30 Minutes Intravenous Every 6 hours 09/28/23 1154 09/28/23 2159   09/28/23 0600  ceFAZolin (ANCEF) IVPB 2g/100 mL premix        2 g 200 mL/hr over 30 Minutes Intravenous On call to O.R. 09/28/23 0556 09/28/23 0856        Patient was given sequential compression devices, early ambulation, and chemoprophylaxis to prevent DVT.  Patient benefited maximally from hospital stay and there were no complications.    Recent vital signs: Patient Vitals for the past 24 hrs:  BP Temp Temp src Pulse Resp SpO2  09/29/23 0924 139/85 97.9 F (36.6 C) -- 81 16 95 %  09/29/23 0540 121/79 99.2 F (37.3 C) Oral 86 14 92 %  09/29/23  0124 113/70 98.1 F (36.7 C) Oral 88 17 91 %  09/28/23 2211 125/82 98.3 F (36.8 C) Oral 92 17 95 %  09/28/23 1633 96/72 98 F (36.7 C) -- 78 18 94 %  09/28/23 1348 115/71 98.1 F (36.7 C) -- 93 18 93 %  09/28/23 1142 115/82 98.5 F (36.9 C) Oral 77 18 96 %  09/28/23 1129 122/78 97.7 F (36.5 C) -- 72 12 100 %  09/28/23 1115 128/82 -- -- 75 15 93 %     Recent laboratory studies:  Recent Labs    09/29/23 0325  WBC 11.7*  HGB 11.9*  HCT 35.9*  PLT 269  NA 132*  K 3.6  CL 101  CO2 23  BUN 24*  CREATININE 0.67  GLUCOSE 109*  CALCIUM 8.5*     Discharge Medications:   Allergies as of 09/29/2023   No Known Allergies      Medication List     TAKE these medications    amphetamine-dextroamphetamine 5 MG tablet Commonly known as: ADDERALL Take 1 tablet (5 mg total) by mouth daily.   amphetamine-dextroamphetamine 5 MG tablet Commonly known as: ADDERALL Take 1 tablet (5 mg total) by mouth daily. Start taking on: October 27, 2023   amphetamine-dextroamphetamine 5 MG tablet Commonly known as: ADDERALL Take 1 tablet (5 mg total) by mouth daily. Start taking on: November 26, 2023   aspirin 81 MG chewable tablet  Chew 1 tablet (81 mg total) by mouth 2 (two) times daily.   atorvastatin 80 MG tablet Commonly known as: LIPITOR Take 1 tablet (80 mg total) by mouth daily.   buPROPion 150 MG 12 hr tablet Commonly known as: WELLBUTRIN SR Take 1 tablet (150 mg total) by mouth daily.   CANNABIDIOL PO Take 750 mg by mouth in the morning. CBD   cetirizine 10 MG tablet Commonly known as: ZYRTEC Take 10 mg by mouth in the morning.   estradiol 0.1 MG/GM vaginal cream Commonly known as: ESTRACE VAGINAL Place 1 Applicatorful vaginally every other day. At night.   gabapentin 100 MG capsule Commonly known as: NEURONTIN Take 1 capsule (100 mg total) by mouth 3 (three) times daily as needed (for sciatic pain).   oxyCODONE 5 MG immediate release tablet Commonly known as:  Oxy IR/ROXICODONE Take 1-2 tablets (5-10 mg total) by mouth every 6 (six) hours as needed for moderate pain (pain score 4-6) (pain score 4-6).   sertraline 50 MG tablet Commonly known as: ZOLOFT Take 1 tablet (50 mg total) by mouth daily.   SUMAtriptan 100 MG tablet Commonly known as: IMITREX Take 1 tablet (100 mg total) by mouth every 2 (two) hours as needed for migraine.   tiZANidine 4 MG tablet Commonly known as: ZANAFLEX Take 1 tablet (4 mg total) by mouth every 6 (six) hours as needed for muscle spasms.   trimethoprim 100 MG tablet Commonly known as: TRIMPEX Take 1 tablet (100 mg total) by mouth daily as needed (after intercourse).   valsartan-hydrochlorothiazide 160-12.5 MG tablet Commonly known as: DIOVAN-HCT Take 1 tablet by mouth daily.               Durable Medical Equipment  (From admission, onward)           Start     Ordered   09/28/23 1155  DME 3 n 1  Once        09/28/23 1154   09/28/23 1155  DME Walker rolling  Once       Question Answer Comment  Walker: With 5 Inch Wheels   Patient needs a walker to treat with the following condition Status post total right knee replacement      09/28/23 1154            Diagnostic Studies: DG Knee Right Port  Result Date: 09/28/2023 CLINICAL DATA:  Postop right knee replacement surgery. EXAM: PORTABLE RIGHT KNEE - 1-2 VIEW COMPARISON:  01/23/2023. FINDINGS: Knee prosthetic components appear well seated and well aligned. No acute fracture or evidence of an operative complication. IMPRESSION: Well-positioned right total knee arthroplasty. Electronically Signed   By: Amie Portland M.D.   On: 09/28/2023 16:01    Disposition: Discharge disposition: 01-Home or Self Care          Follow-up Information     [facility] [place] Follow up.   Why: [facility] will provide PT in the home after discharge.        Kathryne Hitch, MD Follow up in 2 week(s).   Specialty: Orthopedic  Surgery Contact information: [address] [place] [address] [phone]                  Signed: Kathryne Hitch 09/29/2023, 11:12 AM

## 2023-09-29 NOTE — Progress Notes (Signed)
During the patients second PT session BP was assessed. BP supine138/82; sitting 129/83; standing 114/73 and after walking 107/65. The pt reports feeling weakness while ambulating. Call placed to on-call provider, a detailed message left with answering service. Will hold discharge for now. Awaiting providers return call.

## 2023-09-29 NOTE — Care Management Obs Status (Signed)
MEDICARE OBSERVATION STATUS NOTIFICATION   Patient Details  Name: Kristin Benjamin MRN: [id number] Date of Birth: 1956/02/09   Medicare Observation Status Notification Given:  Yes    Adrian Prows, RN 09/29/2023, 2:49 PM

## 2023-09-29 NOTE — Progress Notes (Signed)
Physical Therapy Treatment Patient Details Name: Kristin Benjamin MRN: [id number] DOB: 1956-06-15 Today's Date: 09/29/2023   History of Present Illness Pt s/p R TKR and iwht hx of CAD, ADHD and L TKR(19)    PT Comments  Pt continues motivated and progressing steadily with mobility but limited by BP dropping with activity.  Pt up to bathroom for toileting and hygiene at sink and ambulated limited distance in hall.  BP supine138/82; sitting 129/83; standing 114/73 and after walking 107/65 with pt reporting feeling weak. RN aware.   If plan is discharge home, recommend the following:     Can travel by private vehicle        Equipment Recommendations  None recommended by PT    Recommendations for Other Services       Precautions / Restrictions Precautions Precautions: Knee;Fall Required Braces or Orthoses: Knee Immobilizer - Right Knee Immobilizer - Right: Discontinue once straight leg raise with < 10 degree lag Restrictions Weight Bearing Restrictions: No RLE Weight Bearing: Weight bearing as tolerated Other Position/Activity Restrictions: WBAT     Mobility  Bed Mobility Overal bed mobility: Needs Assistance Bed Mobility: Supine to Sit     Supine to sit: Min assist     General bed mobility comments: Pt up in chair and requests back to same    Transfers Overall transfer level: Needs assistance Equipment used: Rolling walker (2 wheels) Transfers: Sit to/from Stand Sit to Stand: Contact guard assist           General transfer comment: cues for LE management and use of UEs to self assist    Ambulation/Gait Ambulation/Gait assistance: Min assist, Contact guard assist Gait Distance (Feet): 80 Feet (and 15;' into bathroom) Assistive device: Rolling walker (2 wheels) Gait Pattern/deviations: Step-to pattern, Decreased step length - right, Decreased step length - left, Shuffle, Trunk flexed Gait velocity: decr     General Gait Details: cues for sequence, posture  and position from RW; distance ltd by fatigue   Stairs             Wheelchair Mobility     Tilt Bed    Modified Rankin (Stroke Patients Only)       Balance Overall balance assessment: Needs assistance Sitting-balance support: No upper extremity supported, Feet supported Sitting balance-Leahy Scale: Good     Standing balance support: Bilateral upper extremity supported Standing balance-Leahy Scale: Poor                              Cognition Arousal: Alert Behavior During Therapy: WFL for tasks assessed/performed Overall Cognitive Status: Within Functional Limits for tasks assessed                                          Exercises Total Joint Exercises Ankle Circles/Pumps: AROM, Both, 15 reps, Supine Quad Sets: AROM, Both, 10 reps, Supine Heel Slides: AAROM, Right, 15 reps, Supine Straight Leg Raises: Right, 10 reps, Supine, AAROM, AROM    General Comments        Pertinent Vitals/Pain Pain Assessment Pain Assessment: 0-10 Pain Score: 5  Pain Location: R knee and Sciatica Pain Descriptors / Indicators: Aching, Sore Pain Intervention(s): Limited activity within patient's tolerance, Monitored during session, Premedicated before session, Ice applied    Home Living  Prior Function            PT Goals (current goals can now be found in the care plan section) Acute Rehab PT Goals Patient Stated Goal: Regain IND PT Goal Formulation: With patient Time For Goal Achievement: 10/12/23 Potential to Achieve Goals: Good Progress towards PT goals: Progressing toward goals    Frequency    7X/week      PT Plan      Co-evaluation              AM-PAC PT "6 Clicks" Mobility   Outcome Measure  Help needed turning from your back to your side while in a flat bed without using bedrails?: A Little Help needed moving from lying on your back to sitting on the side of a flat bed without  using bedrails?: A Little Help needed moving to and from a bed to a chair (including a wheelchair)?: A Little Help needed standing up from a chair using your arms (e.g., wheelchair or bedside chair)?: A Little Help needed to walk in hospital room?: A Little Help needed climbing 3-5 steps with a railing? : A Lot 6 Click Score: 17    End of Session Equipment Utilized During Treatment: Gait belt Activity Tolerance: Patient tolerated treatment well;Other (comment) Patient left: in chair;with call bell/phone within reach;with chair alarm set;with family/visitor present Nurse Communication: Mobility status PT Visit Diagnosis: Difficulty in walking, not elsewhere classified (R26.2)     Time: 1310-1343 PT Time Calculation (min) (ACUTE ONLY): 33 min  Charges:    $Gait Training: 8-22 mins $Therapeutic Activity: 8-22 mins PT General Charges $$ ACUTE PT VISIT: 1 Visit                     Mauro Kaufmann PT Acute Rehabilitation Services Pager [phone] Office [phone]    Trinetta Alemu 09/29/2023, 4:17 PM

## 2023-09-29 NOTE — Plan of Care (Signed)
  Problem: Coping: Goal: Level of anxiety will decrease Outcome: Progressing   Problem: Safety: Goal: Ability to remain free from injury will improve Outcome: Progressing   Problem: Pain Management: Goal: General experience of comfort will improve Outcome: Progressing

## 2023-09-29 NOTE — Progress Notes (Signed)
Physical Therapy Treatment Patient Details Name: Kristin Benjamin MRN: [id number] DOB: November 24, 1955 Today's Date: 09/29/2023   History of Present Illness Pt s/p R TKR and iwht hx of CAD, ADHD and L TKR(19)    PT Comments  Pt very cooperative and with noted progress this am.  HEP initiated and pt up to ambulate limited distance in hall.    If plan is discharge home, recommend the following:     Can travel by private vehicle        Equipment Recommendations  None recommended by PT    Recommendations for Other Services       Precautions / Restrictions Precautions Precautions: Knee;Fall Required Braces or Orthoses: Knee Immobilizer - Right Knee Immobilizer - Right: Discontinue once straight leg raise with < 10 degree lag Restrictions Weight Bearing Restrictions: No RLE Weight Bearing: Weight bearing as tolerated Other Position/Activity Restrictions: WAT     Mobility  Bed Mobility Overal bed mobility: Needs Assistance Bed Mobility: Supine to Sit     Supine to sit: Min assist     General bed mobility comments: INcreased time with cues for sequence and assist to manage R LE.    Transfers Overall transfer level: Needs assistance Equipment used: Rolling walker (2 wheels) Transfers: Sit to/from Stand Sit to Stand: Min assist, From elevated surface           General transfer comment: cues for LE management and use of UEs to self assist    Ambulation/Gait Ambulation/Gait assistance: Min assist Gait Distance (Feet): 74 Feet Assistive device: Rolling walker (2 wheels) Gait Pattern/deviations: Step-to pattern, Decreased step length - right, Decreased step length - left, Shuffle, Trunk flexed Gait velocity: decr     General Gait Details: cues for sequence, posture and position from RW; distance ltd by fatigue   Stairs             Wheelchair Mobility     Tilt Bed    Modified Rankin (Stroke Patients Only)       Balance Overall balance assessment:  Needs assistance Sitting-balance support: No upper extremity supported, Feet supported Sitting balance-Leahy Scale: Good     Standing balance support: Bilateral upper extremity supported Standing balance-Leahy Scale: Poor                              Cognition Arousal: Alert Behavior During Therapy: WFL for tasks assessed/performed Overall Cognitive Status: Within Functional Limits for tasks assessed                                          Exercises Total Joint Exercises Ankle Circles/Pumps: AROM, Both, 15 reps, Supine Quad Sets: AROM, Both, 10 reps, Supine Heel Slides: AAROM, Right, 15 reps, Supine Straight Leg Raises: Right, 10 reps, Supine, AAROM, AROM    General Comments        Pertinent Vitals/Pain Pain Assessment Pain Assessment: 0-10 Pain Score: 6  Pain Location: R knee and Sciatica Pain Descriptors / Indicators: Aching, Sore Pain Intervention(s): Limited activity within patient's tolerance, Monitored during session, Premedicated before session, Ice applied    Home Living                          Prior Function            PT Goals (current goals  can now be found in the care plan section) Acute Rehab PT Goals Patient Stated Goal: Regain IND PT Goal Formulation: With patient Time For Goal Achievement: 10/12/23 Potential to Achieve Goals: Good Progress towards PT goals: Progressing toward goals    Frequency    7X/week      PT Plan      Co-evaluation              AM-PAC PT "6 Clicks" Mobility   Outcome Measure  Help needed turning from your back to your side while in a flat bed without using bedrails?: A Little Help needed moving from lying on your back to sitting on the side of a flat bed without using bedrails?: A Little Help needed moving to and from a bed to a chair (including a wheelchair)?: A Little Help needed standing up from a chair using your arms (e.g., wheelchair or bedside chair)?: A  Little Help needed to walk in hospital room?: A Little Help needed climbing 3-5 steps with a railing? : A Lot 6 Click Score: 17    End of Session Equipment Utilized During Treatment: Gait belt Activity Tolerance: Patient tolerated treatment well;Other (comment) Patient left: in chair;with call bell/phone within reach;with chair alarm set;with family/visitor present Nurse Communication: Mobility status PT Visit Diagnosis: Difficulty in walking, not elsewhere classified (R26.2)     Time: 9811-9147 PT Time Calculation (min) (ACUTE ONLY): 35 min  Charges:    $Gait Training: 8-22 mins $Therapeutic Exercise: 8-22 mins PT General Charges $$ ACUTE PT VISIT: 1 Visit                     Mauro Kaufmann PT Acute Rehabilitation Services Pager [phone] Office [phone]    Karely Hurtado 09/29/2023, 11:07 AM

## 2023-09-29 NOTE — Progress Notes (Signed)
Subjective: 1 Day Post-Op Procedure(s) (LRB): RIGHT TOTAL KNEE ARTHROPLASTY (Right) Patient reports pain as moderate.  Her sciatica is bothering her more than her knee.  Objective: Vital signs in last 24 hours: Temp:  [97.7 F (36.5 C)-99.2 F (37.3 C)] 97.9 F (36.6 C) (11/16 0924) Pulse Rate:  [72-93] 81 (11/16 0924) Resp:  [12-19] 16 (11/16 0924) BP: (96-139)/(70-85) 139/85 (11/16 0924) SpO2:  [91 %-100 %] 95 % (11/16 0924)  Intake/Output from previous day: 11/15 0701 - 11/16 0700 In: 2800.1 [P.O.:1200; I.V.:1500.1; IV Piggyback:100] Out: 2250 [Urine:2100; Blood:150] Intake/Output this shift: Total I/O In: 240 [P.O.:240] Out: -   Recent Labs    09/29/23 0325  HGB 11.9*   Recent Labs    09/29/23 0325  WBC 11.7*  RBC 3.62*  HCT 35.9*  PLT 269   Recent Labs    09/29/23 0325  NA 132*  K 3.6  CL 101  CO2 23  BUN 24*  CREATININE 0.67  GLUCOSE 109*  CALCIUM 8.5*   No results for input(s): "LABPT", "INR" in the last 72 hours.  Sensation intact distally Intact pulses distally Dorsiflexion/Plantar flexion intact Incision: dressing C/D/I Compartment soft   Assessment/Plan: 1 Day Post-Op Procedure(s) (LRB): RIGHT TOTAL KNEE ARTHROPLASTY (Right) Up with therapy Discharge home with home health      Kristin Benjamin 09/29/2023, 10:53 AM

## 2023-09-29 NOTE — Discharge Instructions (Signed)

## 2023-09-29 NOTE — TOC Progression Note (Signed)
Transition of Care [facility]) - Progression Note    Patient Details  Name: MARVETTE FIGUERO MRN: [id number] Date of Birth: 1956-11-01  [facility] CM/SW Contact  Adrian Prows, RN Phone Number: 09/29/2023, 2:49 PM  Clinical Narrative:    Order received for 3N1; spoke w/ pt in room; she declines DME because she has one at home; no TOC needs.     Barriers to Discharge: No Barriers Identified  Expected Discharge Plan and Services         Expected Discharge Date: 09/29/23               DME Arranged: N/A DME Agency: NA       HH Arranged: PT HH Agency: [facility]     Representative spoke with at [facility]: Prearranged in orthopedist's office   Social Determinants of Health (SDOH) Interventions SDOH Screenings   Food Insecurity: No Food Insecurity (09/28/2023)  Housing: Low Risk  (09/28/2023)  Transportation Needs: No Transportation Needs (09/28/2023)  Utilities: Not At Risk (09/28/2023)  Alcohol Screen: Low Risk  (03/12/2023)  Depression (PHQ2-9): Low Risk  (09/18/2023)  Financial Resource Strain: Patient Declined (03/12/2023)  Physical Activity: Sufficiently Active (03/12/2023)  Social Connections: Unknown (03/12/2023)  Stress: Stress Concern Present (03/12/2023)  Tobacco Use: High Risk (09/28/2023)    Readmission Risk Interventions     No data to display

## 2023-09-30 DIAGNOSIS — Z96652 Presence of left artificial knee joint: Secondary | ICD-10-CM | POA: Diagnosis not present

## 2023-09-30 DIAGNOSIS — M1711 Unilateral primary osteoarthritis, right knee: Secondary | ICD-10-CM | POA: Diagnosis not present

## 2023-09-30 DIAGNOSIS — I2510 Atherosclerotic heart disease of native coronary artery without angina pectoris: Secondary | ICD-10-CM | POA: Diagnosis not present

## 2023-09-30 DIAGNOSIS — I10 Essential (primary) hypertension: Secondary | ICD-10-CM | POA: Diagnosis not present

## 2023-09-30 DIAGNOSIS — F17210 Nicotine dependence, cigarettes, uncomplicated: Secondary | ICD-10-CM | POA: Diagnosis not present

## 2023-09-30 DIAGNOSIS — Z79899 Other long term (current) drug therapy: Secondary | ICD-10-CM | POA: Diagnosis not present

## 2023-09-30 NOTE — Anesthesia Postprocedure Evaluation (Signed)
Anesthesia Post Note  Patient: Kristin Benjamin  Procedure(s) Performed: RIGHT TOTAL KNEE ARTHROPLASTY (Right: Knee)     Patient location during evaluation: PACU Anesthesia Type: MAC and Spinal Level of consciousness: oriented and awake and alert Pain management: pain level controlled Vital Signs Assessment: post-procedure vital signs reviewed and stable Respiratory status: spontaneous breathing, respiratory function stable and patient connected to nasal cannula oxygen Cardiovascular status: blood pressure returned to baseline and stable Postop Assessment: no headache, no backache and no apparent nausea or vomiting Anesthetic complications: no   No notable events documented.  Last Vitals:  Vitals:   09/29/23 1959 09/30/23 0618  BP: 133/83 132/87  Pulse: 85 84  Resp: 18 18  Temp: 37.7 C 37.2 C  SpO2: 93% 93%    Last Pain:  Vitals:   09/30/23 0650  TempSrc:   PainSc: 3                  Louie Flenner S

## 2023-09-30 NOTE — Progress Notes (Signed)
Physical Therapy Treatment Patient Details Name: Kristin Benjamin MRN: [id number] DOB: 1956/07/17 Today's Date: 09/30/2023   History of Present Illness Pt s/p R TKR and iwht hx of CAD, ADHD and L TKR(19)    PT Comments  Pt continues very cooperative and progressing steadily with mobility including up to ambulate in hall and negotiated stairs with no c/o weakness/dizziness.  BP supine118/70; sitting 116/80; stand 111/71; after walking 111/78.  Pt eager for dc home this date.    If plan is discharge home, recommend the following:     Can travel by private vehicle        Equipment Recommendations  None recommended by PT    Recommendations for Other Services       Precautions / Restrictions Precautions Precautions: Knee;Fall Required Braces or Orthoses: Knee Immobilizer - Right Knee Immobilizer - Right: Discontinue once straight leg raise with < 10 degree lag (pt performing SLR IND - ambulates sans KI) Restrictions Weight Bearing Restrictions: No RLE Weight Bearing: Weight bearing as tolerated Other Position/Activity Restrictions: WBAT     Mobility  Bed Mobility Overal bed mobility: Needs Assistance Bed Mobility: Supine to Sit     Supine to sit: Supervision     General bed mobility comments: use of gait belt but no physical assist    Transfers Overall transfer level: Needs assistance Equipment used: Rolling walker (2 wheels) Transfers: Sit to/from Stand Sit to Stand: Supervision           General transfer comment: cues for LE management and use of UEs to self assist    Ambulation/Gait Ambulation/Gait assistance: Contact guard assist, Supervision Gait Distance (Feet): 100 Feet Assistive device: Rolling walker (2 wheels) Gait Pattern/deviations: Step-to pattern, Decreased step length - right, Decreased step length - left, Shuffle, Trunk flexed Gait velocity: decr     General Gait Details: min cues for sequence, posture and position from  RW   Stairs Stairs: Yes Stairs assistance: Min assist Stair Management: One rail Left, Step to pattern, Forwards, With crutches Number of Stairs: 5 General stair comments: 2+3 stairs with rail/crutch and cues for sequence   Wheelchair Mobility     Tilt Bed    Modified Rankin (Stroke Patients Only)       Balance Overall balance assessment: Needs assistance Sitting-balance support: No upper extremity supported, Feet supported Sitting balance-Leahy Scale: Good     Standing balance support: Single extremity supported Standing balance-Leahy Scale: Fair                              Cognition Arousal: Alert Behavior During Therapy: WFL for tasks assessed/performed Overall Cognitive Status: Within Functional Limits for tasks assessed                                          Exercises Total Joint Exercises Ankle Circles/Pumps: AROM, Both, 15 reps, Supine Quad Sets: AROM, Both, 10 reps, Supine Heel Slides: AAROM, Right, 15 reps, Supine Straight Leg Raises: Right, Supine, AAROM, AROM, 15 reps    General Comments        Pertinent Vitals/Pain Pain Assessment Pain Assessment: 0-10 Pain Score: 6  Pain Location: R knee and Sciatica Pain Descriptors / Indicators: Aching, Sore Pain Intervention(s): Limited activity within patient's tolerance, Monitored during session, Premedicated before session, Ice applied    Home Living  Prior Function            PT Goals (current goals can now be found in the care plan section) Acute Rehab PT Goals Patient Stated Goal: Regain IND PT Goal Formulation: With patient Time For Goal Achievement: 10/12/23 Potential to Achieve Goals: Good Progress towards PT goals: Progressing toward goals    Frequency    7X/week      PT Plan      Co-evaluation              AM-PAC PT "6 Clicks" Mobility   Outcome Measure  Help needed turning from your back to your  side while in a flat bed without using bedrails?: A Little Help needed moving from lying on your back to sitting on the side of a flat bed without using bedrails?: A Little Help needed moving to and from a bed to a chair (including a wheelchair)?: A Little Help needed standing up from a chair using your arms (e.g., wheelchair or bedside chair)?: A Little Help needed to walk in hospital room?: A Little Help needed climbing 3-5 steps with a railing? : A Little 6 Click Score: 18    End of Session Equipment Utilized During Treatment: Gait belt Activity Tolerance: Patient tolerated treatment well;Other (comment) Patient left: in chair;with call bell/phone within reach;with chair alarm set;with family/visitor present Nurse Communication: Mobility status PT Visit Diagnosis: Difficulty in walking, not elsewhere classified (R26.2)     Time: 0811-0850 PT Time Calculation (min) (ACUTE ONLY): 39 min  Charges:    $Gait Training: 8-22 mins $Therapeutic Exercise: 8-22 mins PT General Charges $$ ACUTE PT VISIT: 1 Visit                     Mauro Kaufmann PT Acute Rehabilitation Services Pager [phone] Office [phone]    Obert Espindola 09/30/2023, 12:44 PM

## 2023-09-30 NOTE — Progress Notes (Signed)
  Subjective: Kristin Benjamin is a 67 y.o. female s/p right TKA.  They are POD 2.  Pt's pain is controlled.  Pt denies any complain of chest pain, shortness of breath, abdominal pain, calf pain.  Patient denies any fevers or chills.  Patient rates pain 2/10.  She had some orthostatic hypotension yesterday that was initially symptomatic but now she states that she has no difficulty with ambulation in regards to any dizziness or lightheadedness.  She has been up and going to the bathroom multiple times in the past 12 hours without difficulty.  She feels repaired for discharge home.  Objective: Vital signs in last 24 hours: Temp:  [97.9 F (36.6 C)-99.9 F (37.7 C)] 98.9 F (37.2 C) (11/17 0618) Pulse Rate:  [81-85] 84 (11/17 0618) Resp:  [16-18] 18 (11/17 0618) BP: (132-139)/(83-87) 132/87 (11/17 0618) SpO2:  [93 %-95 %] 93 % (11/17 0618)  Intake/Output from previous day: 11/16 0701 - 11/17 0700 In: 1818.7 [P.O.:1680; I.V.:138.7] Out: 0  Intake/Output this shift: No intake/output data recorded.  Exam:  No gross blood or drainage overlying the dressing 2+ DP pulse Sensation intact distally in the operative foot Able to dorsiflex and plantarflex the operative foot No calf tenderness.  Negative Homans' sign. Not able to perform straight leg raise   Labs: Recent Labs    09/29/23 0325  HGB 11.9*   Recent Labs    09/29/23 0325  WBC 11.7*  RBC 3.62*  HCT 35.9*  PLT 269   Recent Labs    09/29/23 0325  NA 132*  K 3.6  CL 101  CO2 23  BUN 24*  CREATININE 0.67  GLUCOSE 109*  CALCIUM 8.5*   No results for input(s): "LABPT", "INR" in the last 72 hours.  Assessment/Plan: Pt is POD 2 s/p TKA.    -Plan to discharge to home today.  She does have a lot of stairs at her house so she would like to have 1 session physical therapy this morning in order to optimize stair training and she is good for discharge after this session.  -Discharge orders placed.  -WBAT with a  walker  -Follow-up with Dr. Magnus Ivan in clinic 2 weeks postoperatively    [facility] 09/30/2023, 7:29 AM

## 2023-10-01 ENCOUNTER — Encounter (HOSPITAL_COMMUNITY): Payer: Self-pay | Admitting: Orthopaedic Surgery

## 2023-10-01 DIAGNOSIS — Z471 Aftercare following joint replacement surgery: Secondary | ICD-10-CM | POA: Diagnosis not present

## 2023-10-01 DIAGNOSIS — Z9181 History of falling: Secondary | ICD-10-CM | POA: Diagnosis not present

## 2023-10-01 DIAGNOSIS — Z860101 Personal history of adenomatous and serrated colon polyps: Secondary | ICD-10-CM | POA: Diagnosis not present

## 2023-10-01 DIAGNOSIS — Z87440 Personal history of urinary (tract) infections: Secondary | ICD-10-CM | POA: Diagnosis not present

## 2023-10-01 DIAGNOSIS — Z96651 Presence of right artificial knee joint: Secondary | ICD-10-CM | POA: Diagnosis not present

## 2023-10-01 DIAGNOSIS — Z7982 Long term (current) use of aspirin: Secondary | ICD-10-CM | POA: Diagnosis not present

## 2023-10-01 DIAGNOSIS — I10 Essential (primary) hypertension: Secondary | ICD-10-CM | POA: Diagnosis not present

## 2023-10-01 DIAGNOSIS — I2510 Atherosclerotic heart disease of native coronary artery without angina pectoris: Secondary | ICD-10-CM | POA: Diagnosis not present

## 2023-10-01 DIAGNOSIS — F419 Anxiety disorder, unspecified: Secondary | ICD-10-CM | POA: Diagnosis not present

## 2023-10-01 DIAGNOSIS — I700 Atherosclerosis of aorta: Secondary | ICD-10-CM | POA: Diagnosis not present

## 2023-10-01 DIAGNOSIS — G8929 Other chronic pain: Secondary | ICD-10-CM | POA: Diagnosis not present

## 2023-10-01 DIAGNOSIS — M7051 Other bursitis of knee, right knee: Secondary | ICD-10-CM | POA: Diagnosis not present

## 2023-10-01 DIAGNOSIS — F17210 Nicotine dependence, cigarettes, uncomplicated: Secondary | ICD-10-CM | POA: Diagnosis not present

## 2023-10-01 DIAGNOSIS — F909 Attention-deficit hyperactivity disorder, unspecified type: Secondary | ICD-10-CM | POA: Diagnosis not present

## 2023-10-01 DIAGNOSIS — F32A Depression, unspecified: Secondary | ICD-10-CM | POA: Diagnosis not present

## 2023-10-01 DIAGNOSIS — E782 Mixed hyperlipidemia: Secondary | ICD-10-CM | POA: Diagnosis not present

## 2023-10-03 DIAGNOSIS — I2510 Atherosclerotic heart disease of native coronary artery without angina pectoris: Secondary | ICD-10-CM | POA: Diagnosis not present

## 2023-10-03 DIAGNOSIS — I10 Essential (primary) hypertension: Secondary | ICD-10-CM | POA: Diagnosis not present

## 2023-10-03 DIAGNOSIS — M7051 Other bursitis of knee, right knee: Secondary | ICD-10-CM | POA: Diagnosis not present

## 2023-10-03 DIAGNOSIS — F17210 Nicotine dependence, cigarettes, uncomplicated: Secondary | ICD-10-CM | POA: Diagnosis not present

## 2023-10-03 DIAGNOSIS — F419 Anxiety disorder, unspecified: Secondary | ICD-10-CM | POA: Diagnosis not present

## 2023-10-03 DIAGNOSIS — I700 Atherosclerosis of aorta: Secondary | ICD-10-CM | POA: Diagnosis not present

## 2023-10-03 DIAGNOSIS — Z87440 Personal history of urinary (tract) infections: Secondary | ICD-10-CM | POA: Diagnosis not present

## 2023-10-03 DIAGNOSIS — F909 Attention-deficit hyperactivity disorder, unspecified type: Secondary | ICD-10-CM | POA: Diagnosis not present

## 2023-10-03 DIAGNOSIS — Z9181 History of falling: Secondary | ICD-10-CM | POA: Diagnosis not present

## 2023-10-03 DIAGNOSIS — F32A Depression, unspecified: Secondary | ICD-10-CM | POA: Diagnosis not present

## 2023-10-03 DIAGNOSIS — Z7982 Long term (current) use of aspirin: Secondary | ICD-10-CM | POA: Diagnosis not present

## 2023-10-03 DIAGNOSIS — G8929 Other chronic pain: Secondary | ICD-10-CM | POA: Diagnosis not present

## 2023-10-03 DIAGNOSIS — Z471 Aftercare following joint replacement surgery: Secondary | ICD-10-CM | POA: Diagnosis not present

## 2023-10-03 DIAGNOSIS — E782 Mixed hyperlipidemia: Secondary | ICD-10-CM | POA: Diagnosis not present

## 2023-10-03 DIAGNOSIS — Z860101 Personal history of adenomatous and serrated colon polyps: Secondary | ICD-10-CM | POA: Diagnosis not present

## 2023-10-03 DIAGNOSIS — Z96651 Presence of right artificial knee joint: Secondary | ICD-10-CM | POA: Diagnosis not present

## 2023-10-05 DIAGNOSIS — G8929 Other chronic pain: Secondary | ICD-10-CM | POA: Diagnosis not present

## 2023-10-05 DIAGNOSIS — Z96651 Presence of right artificial knee joint: Secondary | ICD-10-CM | POA: Diagnosis not present

## 2023-10-05 DIAGNOSIS — F909 Attention-deficit hyperactivity disorder, unspecified type: Secondary | ICD-10-CM | POA: Diagnosis not present

## 2023-10-05 DIAGNOSIS — E782 Mixed hyperlipidemia: Secondary | ICD-10-CM | POA: Diagnosis not present

## 2023-10-05 DIAGNOSIS — I10 Essential (primary) hypertension: Secondary | ICD-10-CM | POA: Diagnosis not present

## 2023-10-05 DIAGNOSIS — Z7982 Long term (current) use of aspirin: Secondary | ICD-10-CM | POA: Diagnosis not present

## 2023-10-05 DIAGNOSIS — Z860101 Personal history of adenomatous and serrated colon polyps: Secondary | ICD-10-CM | POA: Diagnosis not present

## 2023-10-05 DIAGNOSIS — Z471 Aftercare following joint replacement surgery: Secondary | ICD-10-CM | POA: Diagnosis not present

## 2023-10-05 DIAGNOSIS — Z9181 History of falling: Secondary | ICD-10-CM | POA: Diagnosis not present

## 2023-10-05 DIAGNOSIS — Z87440 Personal history of urinary (tract) infections: Secondary | ICD-10-CM | POA: Diagnosis not present

## 2023-10-05 DIAGNOSIS — I2510 Atherosclerotic heart disease of native coronary artery without angina pectoris: Secondary | ICD-10-CM | POA: Diagnosis not present

## 2023-10-05 DIAGNOSIS — F32A Depression, unspecified: Secondary | ICD-10-CM | POA: Diagnosis not present

## 2023-10-05 DIAGNOSIS — I700 Atherosclerosis of aorta: Secondary | ICD-10-CM | POA: Diagnosis not present

## 2023-10-05 DIAGNOSIS — F17210 Nicotine dependence, cigarettes, uncomplicated: Secondary | ICD-10-CM | POA: Diagnosis not present

## 2023-10-05 DIAGNOSIS — M7051 Other bursitis of knee, right knee: Secondary | ICD-10-CM | POA: Diagnosis not present

## 2023-10-05 DIAGNOSIS — F419 Anxiety disorder, unspecified: Secondary | ICD-10-CM | POA: Diagnosis not present

## 2023-10-08 DIAGNOSIS — Z87440 Personal history of urinary (tract) infections: Secondary | ICD-10-CM | POA: Diagnosis not present

## 2023-10-08 DIAGNOSIS — I2510 Atherosclerotic heart disease of native coronary artery without angina pectoris: Secondary | ICD-10-CM | POA: Diagnosis not present

## 2023-10-08 DIAGNOSIS — M7051 Other bursitis of knee, right knee: Secondary | ICD-10-CM | POA: Diagnosis not present

## 2023-10-08 DIAGNOSIS — F32A Depression, unspecified: Secondary | ICD-10-CM | POA: Diagnosis not present

## 2023-10-08 DIAGNOSIS — F17210 Nicotine dependence, cigarettes, uncomplicated: Secondary | ICD-10-CM | POA: Diagnosis not present

## 2023-10-08 DIAGNOSIS — I10 Essential (primary) hypertension: Secondary | ICD-10-CM | POA: Diagnosis not present

## 2023-10-08 DIAGNOSIS — Z96651 Presence of right artificial knee joint: Secondary | ICD-10-CM | POA: Diagnosis not present

## 2023-10-08 DIAGNOSIS — F419 Anxiety disorder, unspecified: Secondary | ICD-10-CM | POA: Diagnosis not present

## 2023-10-08 DIAGNOSIS — Z9181 History of falling: Secondary | ICD-10-CM | POA: Diagnosis not present

## 2023-10-08 DIAGNOSIS — I700 Atherosclerosis of aorta: Secondary | ICD-10-CM | POA: Diagnosis not present

## 2023-10-08 DIAGNOSIS — Z7982 Long term (current) use of aspirin: Secondary | ICD-10-CM | POA: Diagnosis not present

## 2023-10-08 DIAGNOSIS — G8929 Other chronic pain: Secondary | ICD-10-CM | POA: Diagnosis not present

## 2023-10-08 DIAGNOSIS — Z471 Aftercare following joint replacement surgery: Secondary | ICD-10-CM | POA: Diagnosis not present

## 2023-10-08 DIAGNOSIS — E782 Mixed hyperlipidemia: Secondary | ICD-10-CM | POA: Diagnosis not present

## 2023-10-08 DIAGNOSIS — F909 Attention-deficit hyperactivity disorder, unspecified type: Secondary | ICD-10-CM | POA: Diagnosis not present

## 2023-10-08 DIAGNOSIS — Z860101 Personal history of adenomatous and serrated colon polyps: Secondary | ICD-10-CM | POA: Diagnosis not present

## 2023-10-09 ENCOUNTER — Other Ambulatory Visit: Payer: Self-pay | Admitting: Orthopaedic Surgery

## 2023-10-09 ENCOUNTER — Encounter: Payer: Self-pay | Admitting: Orthopaedic Surgery

## 2023-10-09 MED ORDER — OXYCODONE HCL 5 MG PO TABS: 5.0000 mg | ORAL_TABLET | Freq: Four times a day (QID) | ORAL | 0 refills | Status: DC | PRN

## 2023-10-09 MED ORDER — TIZANIDINE HCL 4 MG PO TABS: 4.0000 mg | ORAL_TABLET | Freq: Four times a day (QID) | ORAL | 0 refills | Status: DC | PRN

## 2023-10-09 MED ORDER — GABAPENTIN 100 MG PO CAPS: 100.0000 mg | ORAL_CAPSULE | Freq: Three times a day (TID) | ORAL | 0 refills | Status: DC | PRN

## 2023-10-10 ENCOUNTER — Other Ambulatory Visit: Payer: Self-pay

## 2023-10-10 ENCOUNTER — Telehealth: Payer: Self-pay

## 2023-10-10 DIAGNOSIS — F17210 Nicotine dependence, cigarettes, uncomplicated: Secondary | ICD-10-CM | POA: Diagnosis not present

## 2023-10-10 DIAGNOSIS — E782 Mixed hyperlipidemia: Secondary | ICD-10-CM | POA: Diagnosis not present

## 2023-10-10 DIAGNOSIS — Z471 Aftercare following joint replacement surgery: Secondary | ICD-10-CM | POA: Diagnosis not present

## 2023-10-10 DIAGNOSIS — M7051 Other bursitis of knee, right knee: Secondary | ICD-10-CM | POA: Diagnosis not present

## 2023-10-10 DIAGNOSIS — F32A Depression, unspecified: Secondary | ICD-10-CM | POA: Diagnosis not present

## 2023-10-10 DIAGNOSIS — Z7982 Long term (current) use of aspirin: Secondary | ICD-10-CM | POA: Diagnosis not present

## 2023-10-10 DIAGNOSIS — Z860101 Personal history of adenomatous and serrated colon polyps: Secondary | ICD-10-CM | POA: Diagnosis not present

## 2023-10-10 DIAGNOSIS — M5431 Sciatica, right side: Secondary | ICD-10-CM

## 2023-10-10 DIAGNOSIS — I700 Atherosclerosis of aorta: Secondary | ICD-10-CM | POA: Diagnosis not present

## 2023-10-10 DIAGNOSIS — F419 Anxiety disorder, unspecified: Secondary | ICD-10-CM | POA: Diagnosis not present

## 2023-10-10 DIAGNOSIS — I2510 Atherosclerotic heart disease of native coronary artery without angina pectoris: Secondary | ICD-10-CM | POA: Diagnosis not present

## 2023-10-10 DIAGNOSIS — Z9181 History of falling: Secondary | ICD-10-CM | POA: Diagnosis not present

## 2023-10-10 DIAGNOSIS — Z87440 Personal history of urinary (tract) infections: Secondary | ICD-10-CM | POA: Diagnosis not present

## 2023-10-10 DIAGNOSIS — I10 Essential (primary) hypertension: Secondary | ICD-10-CM | POA: Diagnosis not present

## 2023-10-10 DIAGNOSIS — F909 Attention-deficit hyperactivity disorder, unspecified type: Secondary | ICD-10-CM | POA: Diagnosis not present

## 2023-10-10 DIAGNOSIS — Z96651 Presence of right artificial knee joint: Secondary | ICD-10-CM | POA: Diagnosis not present

## 2023-10-10 DIAGNOSIS — G8929 Other chronic pain: Secondary | ICD-10-CM | POA: Diagnosis not present

## 2023-10-10 NOTE — Telephone Encounter (Signed)
Referral placed.

## 2023-10-10 NOTE — Telephone Encounter (Signed)
Physical therapist called stating that patient is having severe sciatic pain and needs a referral for outpatient physical therapy at [facility].  CB# [phone].  Please advise.  Thank you

## 2023-10-12 ENCOUNTER — Other Ambulatory Visit: Payer: Self-pay | Admitting: Orthopaedic Surgery

## 2023-10-12 ENCOUNTER — Encounter: Payer: Self-pay | Admitting: Orthopaedic Surgery

## 2023-10-15 ENCOUNTER — Ambulatory Visit (INDEPENDENT_AMBULATORY_CARE_PROVIDER_SITE_OTHER): Payer: Medicare Other | Admitting: Orthopaedic Surgery

## 2023-10-15 ENCOUNTER — Other Ambulatory Visit: Payer: Self-pay | Admitting: Orthopaedic Surgery

## 2023-10-15 DIAGNOSIS — Z96651 Presence of right artificial knee joint: Secondary | ICD-10-CM

## 2023-10-15 MED ORDER — METHYLPREDNISOLONE 4 MG PO TABS: ORAL_TABLET | ORAL | 0 refills | Status: DC

## 2023-10-15 NOTE — Progress Notes (Signed)
The patient is here for her first postoperative visit status post a right total knee replacement.  She is actually doing incredibly well in terms of her knee replacement.  She starts outpatient physical therapy on Wednesday of this week.  She is taking medications minimally in terms of pain medication.  Her calf is soft on the right side.  Her knee extension is almost full and I can flex her easily to 90 degrees.  Incision looks good.  Staples have been removed and Steri-Strips applied.  She did let me know she has been dealing with sciatica which she has been more of an issue than her knee.  She is not a diabetic.  I will send in a steroid Dosepak to see if this will help calm down the symptoms of sciatica.  Potentially physical therapy can address this as well but we need to focus on her knee which she understands.  She is showing good strength so she can drive as soon as she is ready and feels comfortable behind the wheel and putting pressure on the gas and the brake as well as not having a narcotic in her system which she knows as well.  Will see her back in 4 weeks to see how she is doing overall but no x-rays are needed.  If there are more issues with sciatica she will let us know.

## 2023-10-16 ENCOUNTER — Other Ambulatory Visit: Payer: Self-pay | Admitting: Family Medicine

## 2023-10-16 DIAGNOSIS — F32A Depression, unspecified: Secondary | ICD-10-CM

## 2023-10-17 ENCOUNTER — Encounter: Payer: Self-pay | Admitting: Physical Therapy

## 2023-10-17 ENCOUNTER — Ambulatory Visit: Payer: Medicare Other | Attending: Orthopaedic Surgery | Admitting: Physical Therapy

## 2023-10-17 ENCOUNTER — Other Ambulatory Visit: Payer: Self-pay

## 2023-10-17 DIAGNOSIS — M25661 Stiffness of right knee, not elsewhere classified: Secondary | ICD-10-CM | POA: Insufficient documentation

## 2023-10-17 DIAGNOSIS — M6281 Muscle weakness (generalized): Secondary | ICD-10-CM | POA: Insufficient documentation

## 2023-10-17 DIAGNOSIS — R600 Localized edema: Secondary | ICD-10-CM | POA: Diagnosis not present

## 2023-10-17 DIAGNOSIS — M25561 Pain in right knee: Secondary | ICD-10-CM | POA: Diagnosis not present

## 2023-10-17 DIAGNOSIS — M5441 Lumbago with sciatica, right side: Secondary | ICD-10-CM | POA: Insufficient documentation

## 2023-10-17 DIAGNOSIS — M5432 Sciatica, left side: Secondary | ICD-10-CM | POA: Insufficient documentation

## 2023-10-17 DIAGNOSIS — M5431 Sciatica, right side: Secondary | ICD-10-CM | POA: Diagnosis not present

## 2023-10-17 NOTE — Therapy (Signed)
OUTPATIENT PHYSICAL THERAPY LOWER EXTREMITY EVALUATION   Patient Name: Kristin Benjamin MRN: [id number] DOB:31-Aug-1956, 67 y.o., female Today's Date: 10/17/2023  END OF SESSION:  PT End of Session - 10/17/23 0854     Visit Number 1    Date for PT Re-Evaluation 11/28/23    Authorization Type [employer]    Progress Note Due on Visit 10    PT Start Time 0850    PT Stop Time 0930    PT Time Calculation (min) 40 min    Activity Tolerance Patient tolerated treatment well    Behavior During Therapy [facility] for tasks assessed/performed             Past Medical History:  Diagnosis Date   Allergy    Anxiety    Arthritis    Depression    Headache    history of   Heart murmur    in the past   Hx of adenomatous colonic polyps 07/17/2017   Hyperlipidemia    Hypertension    Past Surgical History:  Procedure Laterality Date   bladder tack     COLONOSCOPY  2006   and 2018   FUNCTIONAL ENDOSCOPIC SINUS SURGERY     JOINT REPLACEMENT     Left total knee arthroplasty Dr. Magnus Ivan 10-18-18   KNEE ARTHROSCOPY W/ MENISCAL REPAIR Right 08/2016   TOTAL KNEE ARTHROPLASTY Left 10/18/2018   Procedure: LEFT TOTAL KNEE ARTHROPLASTY;  Surgeon: Kathryne Hitch, MD;  Location: [facility];  Service: Orthopedics;  Laterality: Left;   TOTAL KNEE ARTHROPLASTY Right 09/28/2023   Procedure: RIGHT TOTAL KNEE ARTHROPLASTY;  Surgeon: Kathryne Hitch, MD;  Location: [facility];  Service: Orthopedics;  Laterality: Right;   TUBAL LIGATION  1993   Patient Active Problem List   Diagnosis Date Noted   Status post total right knee replacement 09/28/2023   Frequent UTI 06/25/2023   Coronary artery disease involving native coronary artery of native heart without angina pectoris 05/21/2023   Aortic atherosclerosis (HCC) 05/21/2023   Acute cystitis without hematuria 04/17/2023   High risk medication use 12/18/2022   Attention deficit hyperactivity disorder (ADHD) 05/18/2022   Mixed hyperlipidemia  04/27/2022   Primary hypertension 04/27/2022   Anxiety and depression 04/27/2022   Pes anserinus bursitis of right knee 02/16/2020   Status post total left knee replacement 10/18/2018   Chronic pain of right knee 03/28/2018   Hx of adenomatous colonic polyps 07/17/2017    PCP: Clayborne Dana, NP   REFERRING PROVIDER: Kathryne Hitch*   REFERRING DIAG: Q65.78,I69.62 (ICD-10-CM) - Bilateral sciatica   THERAPY DIAG:  Acute pain of right knee  Stiffness of right knee, not elsewhere classified  Muscle weakness (generalized)  Localized edema  Acute right-sided low back pain with right-sided sciatica  Rationale for Evaluation and Treatment: Rehabilitation  ONSET DATE: s/p R TKR on 09/28/2023  SUBJECTIVE:   SUBJECTIVE STATEMENT: Had R TKR on 09/28/2023, 5 in home PT sessions (which was only really 3 sessions), Dr. Magnus Ivan took staples out Monday.   Pain is not bad, using ice machine every couple of hours at home.   Doing her exercises 2-3x day.   The sciatic started hurting because I was walking funny and sitting around for 3 months waiting for medicare to kick in and have surgery.  Monday Dr. Magnus Ivan gave me prednisone.  The prednisone is starting to help.  Using Ice and heat.  Pain in in low back radiating down R buttock.   NEXT MD VISIT:  11/14/2022 with Dr. Magnus Ivan  PERTINENT HISTORY: L TKA 2019, R ankle pain, HTN, low back pain PAIN:  Are you having pain? Yes: NPRS scale: 1-2/10 Pain location: R knee Pain description: ache Aggravating factors: bending Relieving factors: ice  Are you having pain? Yes: NPRS scale: 4/10 Pain location: low back radiating down R buttock Pain description: dull ache, depends what she's doing, increases to 6/10 on hard surfaces Aggravating factors: sitting hard surfaces, laying on back, sleep Relieving factors: prednisone/gabapentin/tizanadine/tylenol, ice & heat  PRECAUTIONS: None  RED FLAGS: None   WEIGHT BEARING  RESTRICTIONS: No  FALLS:  Has patient fallen in last 6 months? No  LIVING ENVIRONMENT: Lives with: lives with their spouse Lives in: House/apartment Stairs: Yes: Internal: 14 steps; on left going up and External: 2 steps; none Has following equipment at home: Single point cane and Walker - 2 wheeled  OCCUPATION: retired  PLOF: Independent and Leisure: pickleball, swim, walk  PATIENT GOALS: get back to walking, pickleball, and activities   OBJECTIVE:  Note: Objective measures were completed at Evaluation unless otherwise noted.  DIAGNOSTIC FINDINGS: DG R knee 09/28/23 IMPRESSION: Well-positioned right total knee arthroplasty.  No imaging of low back  PATIENT SURVEYS:  Modified Oswestry 24/45   COGNITION: Overall cognitive status: Within functional limits for tasks assessed     SENSATION: WFL  EDEMA:  Circumferential: 45cm R, 41.5 cm L knee  MUSCLE LENGTH: NT  POSTURE: No Significant postural limitations  PALPATION: Tenderness R glutes/piriformis. Incision covered with steristrips.  No tenderness in calf.  No signs of infection.   LUMBAR ROM:   NT today due to R knee pain and poor tolerance to standing.   Active  A/PROM  eval  Flexion   Extension   Right lateral flexion   Left lateral flexion   Right rotation   Left rotation    (Blank rows = not tested)    LOWER EXTREMITY ROM:  Active ROM Right eval Left eval  Knee flexion 90 130  Knee extension Lacking 15 0   (Blank rows = not tested)  LOWER EXTREMITY MMT:  MMT Right eval Left eval  Hip flexion 3 5  Hip extension    Hip abduction    Hip adduction    Knee flexion 3+ 5  Knee extension 3+ 5  Ankle dorsiflexion 5 5  Ankle plantarflexion     (Blank rows = not tested)  LUMBAR SPECIAL TESTS:  Slump test: Negative   FUNCTIONAL TESTS:  5 times sit to stand: deferred  GAIT: Distance walked: 50' Assistive device utilized: Single point cane Level of assistance: Modified  independence Comments: slightly antalgic, slightly decreased speed.     TODAY'S TREATMENT:                                                                                                                              DATE:   10/17/23 Therapeutic Exercise: to improve strength and mobility.  Demo, verbal and  tactile cues throughout for technique. Quad sets x 10 SAQ x 10 SLR x 10 Heel slides x 10  Self Care: Risks of infection, importance of following post-op precautions, need to focus on straightening leg, review of HEP, perform on bed for safety as getting up and down from floor is fall risk and would not be safe or comfortable on knees, POC for back as well, education on TrDN.    PATIENT EDUCATION:  Education details: see self care Person educated: Patient Education method: Explanation, Demonstration, Verbal cues, and Handouts Education comprehension: verbalized understanding and returned demonstration  HOME EXERCISE PROGRAM: Access Code: [email or web address] URL: [email or web address] Date: 10/17/2023 Prepared by: Harrie Foreman  Exercises - Supine Quad Set  - 2 x daily - 7 x weekly - 2 sets - 10 reps - 5-10 sec hold - Supine Short Arc Quad  - 2 x daily - 7 x weekly - 2 sets - 10 reps - Straight Leg Raise  - 2 x daily - 7 x weekly - 2 sets - 10 reps - Supine Heel Slide with Strap  - 2 x daily - 7 x weekly - 2 sets - 10 reps - Mini Squat with Counter Support  - 2 x daily - 7 x weekly - 2 sets - 10 reps - Sit to Stand without Arm Support  - 2 x daily - 7 x weekly - 2 sets - 10 reps - Seated Knee Flexion Stretch  - 2 x daily - 7 x weekly - 2 sets - 10 reps  ASSESSMENT:  CLINICAL IMPRESSION: Kristin Benjamin is a 67 y.o. female who was seen today for physical therapy evaluation and treatment for LBP in addition to being s/p R TKR on 09/28/23.   Exam is typical and as expected for R TKR, with significant localized edema, limited knee ROM, poor functional strength, gait and  balance impairment, and poor functional activity tolerance. She also demonstrates tenderness primarily in R glutes/piriformis, although had difficulty with examination today due to post-op knee pain, and also became lightheaded and had to be placed in supine with legs elevated, but recovered quickly afterwards, but deferred TrDN today because of this episode.  Back pain does appear to be primarily muscular, no neural tension with slump test.   Kristin Benjamin will benefit from skilled PT services to address all limitations, reduce pain, and reach optimal level of function.   OBJECTIVE IMPAIRMENTS: Abnormal gait, decreased activity tolerance, decreased balance, decreased endurance, decreased mobility, difficulty walking, decreased ROM, decreased strength, increased edema, increased fascial restrictions, increased muscle spasms, impaired flexibility, and pain.   ACTIVITY LIMITATIONS: carrying, lifting, bending, sitting, standing, squatting, sleeping, stairs, transfers, locomotion level, and caring for others  PARTICIPATION LIMITATIONS: meal prep, cleaning, laundry, driving, shopping, community activity, yard work, and leisure activities  PERSONAL FACTORS: Time since onset of injury/illness/exacerbation and 1-2 comorbidities: low back pain, history of L TKA, R ankle pain, HTN  are also affecting patient's functional outcome.   REHAB POTENTIAL: Excellent  CLINICAL DECISION MAKING: Evolving/moderate complexity  EVALUATION COMPLEXITY: Moderate   GOALS: Goals reviewed with patient? Yes   SHORT TERM GOALS: Target date: 11/07/2023   Independent with initial HEP. Baseline:  Goal status: INITIAL  2.  Patient will report 50% improvement in LBP/sciatica symptoms.  Baseline: 4/10 pain, interrupts sleep Goal status: INITIAL   LONG TERM GOALS: Target date: 11/28/2023   Independent with advanced/ongoing HEP to improve outcomes and carryover.  Baseline:  Goal status: INITIAL  2.  Kristin Spring  Benjamin  will demonstrate right knee flexion to 120 deg to ascend/descend stairs. Baseline: 90 Goal status: INITIAL  3.  Kristin Benjamin will demonstrate full right knee extension for safety with gait. Baseline: lacking 15 Goal status: INITIAL  4.  Kristin Benjamin will be able to ambulate 600' safely without and normal gait pattern to access community.  Baseline: using SPC Goal status: INITIAL  5.  Kristin Benjamin will be able to ascend/descend stairs with 1 HR and reciprocal step pattern safely to access home and community.  Baseline: step to gait Goal status: INITIAL  6.  Kristin Benjamin will demonstrate > 20/30 on FGA to demonstrate decreased risk of falls.   Baseline: NT Goal status: INITIAL  7.  Kristin Benjamin will demonstrate improved functional LE strength by completing 5x STS in <14 seconds.  Baseline: NT Goal status: INITIAL  8.  Kristin Benjamin will report < 18/45 on Modified Oswestry to demonstrate improved quality of life.  Baseline: 24/45 on Modified Oswestry Goal status: INITIAL  9.  Kristin Benjamin will report 75% improvement in LBP/sciatica symptoms.  Baseline:  Goal status: INITIAL    PLAN:  PT FREQUENCY: 2-3x/week  PT DURATION: 6 weeks  PLANNED INTERVENTIONS: 97164- PT Re-evaluation, 97110-Therapeutic exercises, 97530- Therapeutic activity, 97112- Neuromuscular re-education, 97535- Self Care, 16109- Manual therapy, [id number]- Gait training, [phone]- Orthotic Fit/training, 97014- Electrical stimulation (unattended), [id number]- Electrical stimulation (manual), [id number]- Vasopneumatic device, [id number]- Ultrasound, [id number]- Traction (mechanical), Patient/Family education, Balance training, Stair training, Taping, Dry Needling, Joint mobilization, Joint manipulation, Spinal manipulation, Spinal mobilization, Scar mobilization, Cryotherapy, and Moist heat  PLAN FOR NEXT SESSION: review and progress HEP for knee, progress LE strength, balance and ROM.  Measure ROM weekly. Focus on  extension.  Modalities PRN.  For back - TrDN to R glutes to start.    Jena Gauss, PT 10/17/2023, 2:34 PM

## 2023-10-18 ENCOUNTER — Encounter: Payer: Self-pay | Admitting: Physical Therapy

## 2023-10-18 ENCOUNTER — Ambulatory Visit: Payer: Medicare Other | Admitting: Physical Therapy

## 2023-10-18 DIAGNOSIS — R600 Localized edema: Secondary | ICD-10-CM

## 2023-10-18 DIAGNOSIS — M25561 Pain in right knee: Secondary | ICD-10-CM | POA: Diagnosis not present

## 2023-10-18 DIAGNOSIS — M25661 Stiffness of right knee, not elsewhere classified: Secondary | ICD-10-CM | POA: Diagnosis not present

## 2023-10-18 DIAGNOSIS — M6281 Muscle weakness (generalized): Secondary | ICD-10-CM

## 2023-10-18 DIAGNOSIS — M5432 Sciatica, left side: Secondary | ICD-10-CM | POA: Diagnosis not present

## 2023-10-18 DIAGNOSIS — M5441 Lumbago with sciatica, right side: Secondary | ICD-10-CM | POA: Diagnosis not present

## 2023-10-18 DIAGNOSIS — M5431 Sciatica, right side: Secondary | ICD-10-CM | POA: Diagnosis not present

## 2023-10-18 NOTE — Therapy (Signed)
OUTPATIENT PHYSICAL THERAPY LOWER EXTREMITY TREATMENT   Patient Name: Kristin Benjamin MRN: [id number] DOB:02/04/1956, 67 y.o., female Today's Date: 10/18/2023  END OF SESSION:  PT End of Session - 10/18/23 0851     Visit Number 2    Date for PT Re-Evaluation 11/28/23    Authorization Type [employer]    Progress Note Due on Visit 10    PT Start Time 0848    PT Stop Time 0932    PT Time Calculation (min) 44 min    Activity Tolerance Patient tolerated treatment well    Behavior During Therapy [facility] for tasks assessed/performed             Past Medical History:  Diagnosis Date   Allergy    Anxiety    Arthritis    Depression    Headache    history of   Heart murmur    in the past   Hx of adenomatous colonic polyps 07/17/2017   Hyperlipidemia    Hypertension    Past Surgical History:  Procedure Laterality Date   bladder tack     COLONOSCOPY  2006   and 2018   FUNCTIONAL ENDOSCOPIC SINUS SURGERY     JOINT REPLACEMENT     Left total knee arthroplasty Dr. Magnus Ivan 10-18-18   KNEE ARTHROSCOPY W/ MENISCAL REPAIR Right 08/2016   TOTAL KNEE ARTHROPLASTY Left 10/18/2018   Procedure: LEFT TOTAL KNEE ARTHROPLASTY;  Surgeon: Kathryne Hitch, MD;  Location: [facility];  Service: Orthopedics;  Laterality: Left;   TOTAL KNEE ARTHROPLASTY Right 09/28/2023   Procedure: RIGHT TOTAL KNEE ARTHROPLASTY;  Surgeon: Kathryne Hitch, MD;  Location: [facility];  Service: Orthopedics;  Laterality: Right;   TUBAL LIGATION  1993   Patient Active Problem List   Diagnosis Date Noted   Status post total right knee replacement 09/28/2023   Frequent UTI 06/25/2023   Coronary artery disease involving native coronary artery of native heart without angina pectoris 05/21/2023   Aortic atherosclerosis (HCC) 05/21/2023   Acute cystitis without hematuria 04/17/2023   High risk medication use 12/18/2022   Attention deficit hyperactivity disorder (ADHD) 05/18/2022   Mixed hyperlipidemia  04/27/2022   Primary hypertension 04/27/2022   Anxiety and depression 04/27/2022   Pes anserinus bursitis of right knee 02/16/2020   Status post total left knee replacement 10/18/2018   Chronic pain of right knee 03/28/2018   Hx of adenomatous colonic polyps 07/17/2017    PCP: Clayborne Dana, NP   REFERRING PROVIDER: Kathryne Hitch*   REFERRING DIAG: W09.81,X91.47 (ICD-10-CM) - Bilateral sciatica   THERAPY DIAG:  Acute pain of right knee  Stiffness of right knee, not elsewhere classified  Muscle weakness (generalized)  Localized edema  Acute right-sided low back pain with right-sided sciatica  Rationale for Evaluation and Treatment: Rehabilitation  ONSET DATE: s/p R TKR on 09/28/2023  SUBJECTIVE:   SUBJECTIVE STATEMENT: Bad night last night with sciatic pain. R knee is doing well.    NEXT MD VISIT: 11/14/2022 with Dr. Magnus Ivan  PERTINENT HISTORY: L TKA 2019, R ankle pain, HTN, low back pain PAIN:  Are you having pain? Yes: NPRS scale: 1-2/10 Pain location: R knee Pain description: ache Aggravating factors: bending Relieving factors: ice  Are you having pain? Yes: NPRS scale: 4/10 Pain location: low back radiating down R buttock Pain description: dull ache, depends what she's doing, increases to 6/10 on hard surfaces Aggravating factors: sitting hard surfaces, laying on back, sleep Relieving factors: prednisone/gabapentin/tizanadine/tylenol, ice & heat  PRECAUTIONS: None  RED FLAGS: None   WEIGHT BEARING RESTRICTIONS: No  FALLS:  Has patient fallen in last 6 months? No  LIVING ENVIRONMENT: Lives with: lives with their spouse Lives in: House/apartment Stairs: Yes: Internal: 14 steps; on left going up and External: 2 steps; none Has following equipment at home: Single point cane and Walker - 2 wheeled  OCCUPATION: retired  PLOF: Independent and Leisure: pickleball, swim, walk  PATIENT GOALS: get back to walking, pickleball, and  activities   OBJECTIVE:  Note: Objective measures were completed at Evaluation unless otherwise noted.  DIAGNOSTIC FINDINGS: DG R knee 09/28/23 IMPRESSION: Well-positioned right total knee arthroplasty.  No imaging of low back  PATIENT SURVEYS:  Modified Oswestry 24/45   COGNITION: Overall cognitive status: Within functional limits for tasks assessed     SENSATION: WFL  EDEMA:  Circumferential: 45cm R, 41.5 cm L knee  MUSCLE LENGTH: NT  POSTURE: No Significant postural limitations  PALPATION: Tenderness R glutes/piriformis. Incision covered with steristrips.  No tenderness in calf.  No signs of infection.   LUMBAR ROM:   NT today due to R knee pain and poor tolerance to standing.   Active  A/PROM  eval  Flexion   Extension   Right lateral flexion   Left lateral flexion   Right rotation   Left rotation    (Blank rows = not tested)    LOWER EXTREMITY ROM:  Active ROM Right eval Left eval  Knee flexion 90 130  Knee extension Lacking 15 0   (Blank rows = not tested)  LOWER EXTREMITY MMT:  MMT Right eval Left eval  Hip flexion 3 5  Hip extension    Hip abduction    Hip adduction    Knee flexion 3+ 5  Knee extension 3+ 5  Ankle dorsiflexion 5 5  Ankle plantarflexion     (Blank rows = not tested)  LUMBAR SPECIAL TESTS:  Slump test: Negative   FUNCTIONAL TESTS:  5 times sit to stand: deferred  GAIT: Distance walked: 50' Assistive device utilized: Single point cane Level of assistance: Modified independence Comments: slightly antalgic, slightly decreased speed.     TODAY'S TREATMENT:                                                                                                                              DATE:  10/18/23 Therapeutic Exercise: to improve strength and mobility.  Demo, verbal and tactile cues throughout for technique. Bike seat 8 L1 x 6 min - retro full revolutions Seated HS stretch RLE before and after manual Piriformis  stretches supine -challenging due to R knee, SKTC stretch best Standing gastroc stretch Review of standing exercises - heel raises, hip extension, hip abducution Manual Therapy: to decrease muscle spasm and pain and improve mobility STM/TPR to R glutes and piriformis, skilled palpation and monitoring during dry needling. Trigger Point Dry-Needling  Treatment instructions: Expect mild to moderate muscle soreness. S/S of pneumothorax  if dry needled over a lung field, and to seek immediate medical attention should they occur. Patient verbalized understanding of these instructions and education. Patient Consent Given: Yes Education handout provided: Yes Muscles treated: R glut med/max, R piriformis Electrical stimulation performed: No Parameters: N/A Treatment response/outcome: Twitch Response Elicited and Palpable Increase in Muscle Length   10/17/23 Therapeutic Exercise: to improve strength and mobility.  Demo, verbal and tactile cues throughout for technique. Quad sets x 10 SAQ x 10 SLR x 10 Heel slides x 10  Self Care: Risks of infection, importance of following post-op precautions, need to focus on straightening leg, review of HEP, perform on bed for safety as getting up and down from floor is fall risk and would not be safe or comfortable on knees, POC for back as well, education on TrDN.    PATIENT EDUCATION:  Education details: HEP update, TrDN Person educated: Patient Education method: Explanation, Demonstration, Verbal cues, and Handouts Education comprehension: verbalized understanding and returned demonstration  HOME EXERCISE PROGRAM: Access Code: [email or web address] URL: [email or web address] Date: 10/18/2023 Prepared by: Harrie Foreman  Exercises - Supine Quad Set  - 2 x daily - 7 x weekly - 2 sets - 10 reps - 5-10 sec hold - Supine Short Arc Quad  - 2 x daily - 7 x weekly - 2 sets - 10 reps - Straight Leg Raise  - 2 x daily - 7 x weekly - 2 sets - 10 reps -  Supine Heel Slide with Strap  - 2 x daily - 7 x weekly - 2 sets - 10 reps - Mini Squat with Counter Support  - 2 x daily - 7 x weekly - 2 sets - 10 reps - Sit to Stand without Arm Support  - 2 x daily - 7 x weekly - 2 sets - 10 reps - Seated Knee Flexion Stretch  - 2 x daily - 7 x weekly - 2 sets - 10 reps - Seated Hamstring Stretch  - 2 x daily - 7 x weekly - 3 sets - 10 reps - Standing Hip Abduction with Counter Support  - 2 x daily - 7 x weekly - 3 sets - 10 reps - Heel Raises with Counter Support  - 2 x daily - 7 x weekly - 3 sets - 10 reps - Standing Hip Extension with Counter Support  - 2 x daily - 7 x weekly - 3 sets - 10 reps - Gastroc Stretch on Wall  - 2 x daily - 7 x weekly - 3 sets - 10 reps - Seated Knee Extension Stretch with Chair  - 1 x daily - 7 x weekly - 1 sets - 1 reps - 5 min hold  ASSESSMENT:  CLINICAL IMPRESSION: Lillyn T Celestine reported continued pain in R glutes interrupting sleep.  Good compliance with HEP for R knee.  After explanation of DN rational, procedures, outcomes and potential side effects, patient verbalized consent to DN treatment in conjunction with manual STM/DTM and TPR to reduce ttp/muscle tension. Muscles treated as indicated above. DN produced normal response with good twitches elicited resulting in palpable reduction in pain/ttp and muscle tension, with patient noting less pain upon initiation of movement following DN. Pt educated to expect mild to moderate muscle soreness for up to 24-48 hrs and instructed to continue prescribed home exercise program and current activity level with pt verbalizing understanding of theses instructions.   Also updated HEP for R knee adding in more standing exercises.  Exercises for R  piriformis complicated by post-op R knee status, but hamstring stretch did feel good.  [place] Artist continues to demonstrate potential for improvement and would benefit from continued skilled therapy to address impairments.    OBJECTIVE  IMPAIRMENTS: Abnormal gait, decreased activity tolerance, decreased balance, decreased endurance, decreased mobility, difficulty walking, decreased ROM, decreased strength, increased edema, increased fascial restrictions, increased muscle spasms, impaired flexibility, and pain.   ACTIVITY LIMITATIONS: carrying, lifting, bending, sitting, standing, squatting, sleeping, stairs, transfers, locomotion level, and caring for others  PARTICIPATION LIMITATIONS: meal prep, cleaning, laundry, driving, shopping, community activity, yard work, and leisure activities  PERSONAL FACTORS: Time since onset of injury/illness/exacerbation and 1-2 comorbidities: low back pain, history of L TKA, R ankle pain, HTN  are also affecting patient's functional outcome.   REHAB POTENTIAL: Excellent  CLINICAL DECISION MAKING: Evolving/moderate complexity  EVALUATION COMPLEXITY: Moderate   GOALS: Goals reviewed with patient? Yes   SHORT TERM GOALS: Target date: 11/07/2023   Independent with initial HEP. Baseline:  Goal status: IN PROGRESS  2.  Patient will report 50% improvement in LBP/sciatica symptoms.  Baseline: 4/10 pain, interrupts sleep Goal status: IN PROGRESS   LONG TERM GOALS: Target date: 11/28/2023   Independent with advanced/ongoing HEP to improve outcomes and carryover.  Baseline:  Goal status: IN PROGRESS  2.  Rivka Spring Brash will demonstrate right knee flexion to 120 deg to ascend/descend stairs. Baseline: 90 Goal status: IN PROGRESS  3.  Rivka Spring Marini will demonstrate full right knee extension for safety with gait. Baseline: lacking 15 Goal status: IN PROGRESS  4.  Rivka Spring Zellmer will be able to ambulate 600' safely without and normal gait pattern to access community.  Baseline: using SPC Goal status: IN PROGRESS  5.  Rivka Spring Wishon will be able to ascend/descend stairs with 1 HR and reciprocal step pattern safely to access home and community.  Baseline: step to gait Goal  status: IN PROGRESS  6.  Rivka Spring Artiaga will demonstrate > 20/30 on FGA to demonstrate decreased risk of falls.   Baseline: NT Goal status: IN PROGRESS  7.  Rivka Spring Bowie will demonstrate improved functional LE strength by completing 5x STS in <14 seconds.  Baseline: NT Goal status: IN PROGRESS  8.  Rivka Spring Kotlarz will report < 18/45 on Modified Oswestry to demonstrate improved quality of life.  Baseline: 24/45 on Modified Oswestry Goal status: IN PROGRESS  9.  Rivka Spring [place] will report 75% improvement in LBP/sciatica symptoms.  Baseline:  Goal status: IN PROGRESS    PLAN:  PT FREQUENCY: 2-3x/week  PT DURATION: 6 weeks  PLANNED INTERVENTIONS: 97164- PT Re-evaluation, 97110-Therapeutic exercises, 97530- Therapeutic activity, 97112- Neuromuscular re-education, 97535- Self Care, 16109- Manual therapy, [id number]- Gait training, [phone]- Orthotic Fit/training, 97014- Electrical stimulation (unattended), [id number]- Electrical stimulation (manual), [id number]- Vasopneumatic device, [id number]- Ultrasound, [id number]- Traction (mechanical), Patient/Family education, Balance training, Stair training, Taping, Dry Needling, Joint mobilization, Joint manipulation, Spinal manipulation, Spinal mobilization, Scar mobilization, Cryotherapy, and Moist heat  PLAN FOR NEXT SESSION: review and progress HEP for knee, progress LE strength, balance and ROM.  Measure ROM weekly. Focus on extension.  Modalities PRN.  For back - TrDN to R glutes to start.    Jena Gauss, PT 10/18/2023, 9:43 AM

## 2023-10-22 ENCOUNTER — Encounter: Payer: Self-pay | Admitting: Family Medicine

## 2023-10-22 ENCOUNTER — Ambulatory Visit (INDEPENDENT_AMBULATORY_CARE_PROVIDER_SITE_OTHER): Payer: Medicare Other | Admitting: Family Medicine

## 2023-10-22 VITALS — BP 118/72 | HR 98 | Temp 98.3°F | Ht 68.0 in | Wt 172.0 lb

## 2023-10-22 DIAGNOSIS — R350 Frequency of micturition: Secondary | ICD-10-CM | POA: Diagnosis not present

## 2023-10-22 DIAGNOSIS — H1033 Unspecified acute conjunctivitis, bilateral: Secondary | ICD-10-CM | POA: Diagnosis not present

## 2023-10-22 LAB — POC URINALSYSI DIPSTICK (AUTOMATED)
Bilirubin, UA: NEGATIVE
Blood, UA: NEGATIVE
Glucose, UA: NEGATIVE
Ketones, UA: NEGATIVE
Leukocytes, UA: NEGATIVE
Nitrite, UA: NEGATIVE
Protein, UA: NEGATIVE
Spec Grav, UA: 1.01 (ref 1.010–1.025)
Urobilinogen, UA: 0.2 U/dL
pH, UA: 6 (ref 5.0–8.0)

## 2023-10-22 MED ORDER — OFLOXACIN 0.3 % OP SOLN: 1.0000 [drp] | Freq: Four times a day (QID) | OPHTHALMIC | 0 refills | Status: AC

## 2023-10-22 NOTE — Progress Notes (Signed)
Acute Office Visit  Subjective:     Patient ID: Kristin Benjamin, female    DOB: 1956-04-28, 67 y.o.   MRN: [id number]  Chief Complaint  Patient presents with   Eye Problem    Eye Problem    Patient is in today for eye discomfort and UTI symptoms.   Discussed the use of AI scribe software for clinical note transcription with the patient, who gave verbal consent to proceed.  History of Present Illness   The patient, three weeks knee replacement (right) post-surgery, presents with recent onset urinary symptoms and ocular discomfort. The urinary symptoms, characterized by urgency and frequency, began a few days prior to the consultation. No other associated symptoms were reported.  Concurrently, the patient has been experiencing ocular discomfort, described as a grainy, filmy sensation with morning purulence. The eyes are also noted to be red and become goopy throughout the day if not wiped. The patient denies any ocular pain, itching, or changes in vision. They have recently started using over-the-counter allergy drops without any noticeable improvement. The patient's spouse had a similar ocular condition a month ago, which was treated with antibiotic drops. The patient normally wears contact lenses but has refrained from using them due to the current ocular symptoms.             ROS All review of systems negative except what is listed in the HPI      Objective:    BP 118/72   Pulse 98   Temp 98.3 F (36.8 C) (Oral)   Ht 5\' 8"  (1.727 m)   Wt 172 lb (78 kg)   SpO2 97%   BMI 26.15 kg/m    Physical Exam Vitals reviewed.  Constitutional:      Appearance: Normal appearance.  Eyes:     General: Lids are normal.     Extraocular Movements:     Right eye: Normal extraocular motion.     Left eye: Normal extraocular motion.     Comments: Bilateral conjunctivitis   Skin:    General: Skin is warm and dry.  Neurological:     Mental Status: She is alert and oriented  to person, place, and time.  Psychiatric:        Mood and Affect: Mood normal.        Behavior: Behavior normal.        Thought Content: Thought content normal.        Judgment: Judgment normal.     Results for orders placed or performed in visit on 10/22/23  POCT Urinalysis Dipstick (Automated)  Result Value Ref Range   Color, UA yellow    Clarity, UA clear    Glucose, UA Negative Negative   Bilirubin, UA negative    Ketones, UA negative    Spec Grav, UA 1.010 1.010 - 1.025   Blood, UA negative    pH, UA 6.0 5.0 - 8.0   Protein, UA Negative Negative   Urobilinogen, UA 0.2 0.2 or 1.0 E.U./dL   Nitrite, UA negative    Leukocytes, UA Negative Negative        Assessment & Plan:   Problem List Items Addressed This Visit   None Visit Diagnoses     Urinary frequency    -  Primary   Relevant Orders   POCT Urinalysis Dipstick (Automated) (Completed)   Urine Culture   Acute bacterial conjunctivitis of both eyes       Relevant Medications   ofloxacin (OCUFLOX) 0.3 % ophthalmic  solution         Urinary Symptoms Recent onset of urinary frequency and urgency. No other symptoms reported. Urinalysis performed with pending culture. -If symptoms worsen, patient to contact office. -Results of urine culture to be communicated in two days.  Conjunctivitis Bilateral eye redness, discharge, and a gritty sensation. No itching or vision changes. Possible bacterial conjunctivitis, given recent similar symptoms in spouse. -Prescribe antibiotic eye drops. -Advise warm compresses and maintaining eye hygiene. -Avoid contact lens use until resolution of symptoms. -Follow-up if symptoms worsen or do not improve.        Meds ordered this encounter  Medications   ofloxacin (OCUFLOX) 0.3 % ophthalmic solution    Sig: Place 1 drop into both eyes 4 (four) times daily for 7 days.    Dispense:  5 mL    Refill:  0    Order Specific Question:   Supervising Provider    Answer:   Danise Edge A [[id number]]    Return if symptoms worsen or fail to improve.  Clayborne Dana, NP

## 2023-10-23 ENCOUNTER — Ambulatory Visit: Payer: Medicare Other | Admitting: Physical Therapy

## 2023-10-23 ENCOUNTER — Encounter: Payer: Self-pay | Admitting: Physical Therapy

## 2023-10-23 DIAGNOSIS — M5431 Sciatica, right side: Secondary | ICD-10-CM | POA: Diagnosis not present

## 2023-10-23 DIAGNOSIS — M5432 Sciatica, left side: Secondary | ICD-10-CM | POA: Diagnosis not present

## 2023-10-23 DIAGNOSIS — M25561 Pain in right knee: Secondary | ICD-10-CM | POA: Diagnosis not present

## 2023-10-23 DIAGNOSIS — M6281 Muscle weakness (generalized): Secondary | ICD-10-CM

## 2023-10-23 DIAGNOSIS — M5441 Lumbago with sciatica, right side: Secondary | ICD-10-CM | POA: Diagnosis not present

## 2023-10-23 DIAGNOSIS — M25661 Stiffness of right knee, not elsewhere classified: Secondary | ICD-10-CM | POA: Diagnosis not present

## 2023-10-23 DIAGNOSIS — R600 Localized edema: Secondary | ICD-10-CM

## 2023-10-23 LAB — URINE CULTURE
MICRO NUMBER:: 15825831
Result:: NO GROWTH
SPECIMEN QUALITY:: ADEQUATE

## 2023-10-23 NOTE — Therapy (Signed)
OUTPATIENT PHYSICAL THERAPY LOWER EXTREMITY TREATMENT   Patient Name: Kristin Benjamin MRN: [id number] DOB:February 12, 1956, 67 y.o., female Today's Date: 10/23/2023  END OF SESSION:  PT End of Session - 10/23/23 1406     Visit Number 3    Date for PT Re-Evaluation 11/28/23    Authorization Type [employer]    Progress Note Due on Visit 10    PT Start Time 1404    PT Stop Time 1447    PT Time Calculation (min) 43 min    Activity Tolerance Patient tolerated treatment well    Behavior During Therapy WFL for tasks assessed/performed             Past Medical History:  Diagnosis Date   Allergy    Anxiety    Arthritis    Depression    Headache    history of   Heart murmur    in the past   Hx of adenomatous colonic polyps 07/17/2017   Hyperlipidemia    Hypertension    Past Surgical History:  Procedure Laterality Date   bladder tack     COLONOSCOPY  2006   and 2018   FUNCTIONAL ENDOSCOPIC SINUS SURGERY     JOINT REPLACEMENT     Left total knee arthroplasty Dr. Magnus Ivan 10-18-18   KNEE ARTHROSCOPY W/ MENISCAL REPAIR Right 08/2016   TOTAL KNEE ARTHROPLASTY Left 10/18/2018   Procedure: LEFT TOTAL KNEE ARTHROPLASTY;  Surgeon: Kathryne Hitch, MD;  Location: [facility];  Service: Orthopedics;  Laterality: Left;   TOTAL KNEE ARTHROPLASTY Right 09/28/2023   Procedure: RIGHT TOTAL KNEE ARTHROPLASTY;  Surgeon: Kathryne Hitch, MD;  Location: [facility];  Service: Orthopedics;  Laterality: Right;   TUBAL LIGATION  1993   Patient Active Problem List   Diagnosis Date Noted   Status post total right knee replacement 09/28/2023   Frequent UTI 06/25/2023   Coronary artery disease involving native coronary artery of native heart without angina pectoris 05/21/2023   Aortic atherosclerosis (HCC) 05/21/2023   Acute cystitis without hematuria 04/17/2023   High risk medication use 12/18/2022   Attention deficit hyperactivity disorder (ADHD) 05/18/2022   Mixed hyperlipidemia  04/27/2022   Primary hypertension 04/27/2022   Anxiety and depression 04/27/2022   Pes anserinus bursitis of right knee 02/16/2020   Status post total left knee replacement 10/18/2018   Chronic pain of right knee 03/28/2018   Hx of adenomatous colonic polyps 07/17/2017    PCP: Clayborne Dana, NP   REFERRING PROVIDER: Kathryne Hitch*   REFERRING DIAG: W09.81,X91.47 (ICD-10-CM) - Bilateral sciatica   THERAPY DIAG:  Acute pain of right knee  Stiffness of right knee, not elsewhere classified  Muscle weakness (generalized)  Localized edema  Acute right-sided low back pain with right-sided sciatica  Rationale for Evaluation and Treatment: Rehabilitation  ONSET DATE: s/p R TKR on 09/28/2023  SUBJECTIVE:   SUBJECTIVE STATEMENT: TrDN helped significantly with the sciatica, 30-40% with pain, she slept the rest of the day afterwards.  Still gets pain, unpredictable.  Knee is doing well, walking with cane today.    NEXT MD VISIT: 11/14/2022 with Dr. Magnus Ivan  PERTINENT HISTORY: L TKA 2019, R ankle pain, HTN, low back pain PAIN:  Are you having pain? Yes: NPRS scale: 1-3/10 Pain location: R knee Pain description: ache Aggravating factors: bending Relieving factors: ice  Are you having pain? Yes: NPRS scale: 4-5/10 Pain location: low back radiating down R buttock Pain description: dull ache, depends what she's doing, increases to  6/10 on hard surfaces Aggravating factors: sitting hard surfaces, laying on back, sleep Relieving factors: prednisone/gabapentin/tizanadine/tylenol, ice & heat  PRECAUTIONS: None  RED FLAGS: None   WEIGHT BEARING RESTRICTIONS: No  FALLS:  Has patient fallen in last 6 months? No  LIVING ENVIRONMENT: Lives with: lives with their spouse Lives in: House/apartment Stairs: Yes: Internal: 14 steps; on left going up and External: 2 steps; none Has following equipment at home: Single point cane and Walker - 2 wheeled  OCCUPATION:  retired  PLOF: Independent and Leisure: pickleball, swim, walk  PATIENT GOALS: get back to walking, pickleball, and activities   OBJECTIVE:  Note: Objective measures were completed at Evaluation unless otherwise noted.  DIAGNOSTIC FINDINGS: DG R knee 09/28/23 IMPRESSION: Well-positioned right total knee arthroplasty.  No imaging of low back  PATIENT SURVEYS:  Modified Oswestry 24/45   COGNITION: Overall cognitive status: Within functional limits for tasks assessed     SENSATION: WFL  EDEMA:  Circumferential: 45cm R, 41.5 cm L knee  MUSCLE LENGTH: NT  POSTURE: No Significant postural limitations  PALPATION: Tenderness R glutes/piriformis. Incision covered with steristrips.  No tenderness in calf.  No signs of infection.   LUMBAR ROM:   NT today due to R knee pain and poor tolerance to standing.   Active  A/PROM  eval  Flexion   Extension   Right lateral flexion   Left lateral flexion   Right rotation   Left rotation    (Blank rows = not tested)    LOWER EXTREMITY ROM:  Active ROM Right eval Left eval Right 10/23/23  Knee flexion 90 130 103  Knee extension Lacking 15 0 Lacking 8   (Blank rows = not tested)  LOWER EXTREMITY MMT:  MMT Right eval Left eval  Hip flexion 3 5  Hip extension    Hip abduction    Hip adduction    Knee flexion 3+ 5  Knee extension 3+ 5  Ankle dorsiflexion 5 5  Ankle plantarflexion     (Blank rows = not tested)  LUMBAR SPECIAL TESTS:  Slump test: Negative   FUNCTIONAL TESTS:  5 times sit to stand: deferred  GAIT: Distance walked: 50' Assistive device utilized: Single point cane Level of assistance: Modified independence Comments: slightly antalgic, slightly decreased speed.     TODAY'S TREATMENT:                                                                                                                              DATE:  10/23/23 Therapeutic Exercise: to improve strength and mobility.  Demo, verbal and  tactile cues throughout for technique. Nustep L5 x 6 min  Prone knee bends RLE x 10 Contract relax to quads with therapist progressively bending knee with rotational movements to help relax glutes to promote knee flexion.  Prone hip extension 2 x 5 bil  Standing toe raises x10 Standing hamstring curls x 5 r/l Standing hip extension 2 x 10 r/l  Standing hip abduction x 10 r/l Sit to stands with feet even x 10 Church pews x 15 In prone - contract relax to quads with therapist progressively bending knee with rotational movements to help relax glutes to promote knee flexion.  Manual Therapy: to decrease muscle spasm and pain and improve mobility STM/TPR to R glutes and piriformis, skilled palpation and monitoring during dry needling. Trigger Point Dry-Needling  Treatment instructions: Expect mild to moderate muscle soreness. S/S of pneumothorax if dry needled over a lung field, and to seek immediate medical attention should they occur. Patient verbalized understanding of these instructions and education. Patient Consent Given: Yes Education handout provided: Previously provided Muscles treated: R glut med/max, R piriformis Electrical stimulation performed: No Parameters: N/A Treatment response/outcome: Twitch Response Elicited and Palpable Increase in Muscle Length  10/18/23 Therapeutic Exercise: to improve strength and mobility.  Demo, verbal and tactile cues throughout for technique. Bike seat 8 L1 x 6 min - retro full revolutions Seated HS stretch RLE before and after manual Piriformis stretches supine -challenging due to R knee, SKTC stretch best Standing gastroc stretch Review of standing exercises - heel raises, hip extension, hip abducution Manual Therapy: to decrease muscle spasm and pain and improve mobility STM/TPR to R glutes and piriformis, skilled palpation and monitoring during dry needling. Trigger Point Dry-Needling  Treatment instructions: Expect mild to moderate muscle  soreness. S/S of pneumothorax if dry needled over a lung field, and to seek immediate medical attention should they occur. Patient verbalized understanding of these instructions and education. Patient Consent Given: Yes Education handout provided: Yes Muscles treated: R glut med/max, R piriformis Electrical stimulation performed: No Parameters: N/A Treatment response/outcome: Twitch Response Elicited and Palpable Increase in Muscle Length   10/17/23 Therapeutic Exercise: to improve strength and mobility.  Demo, verbal and tactile cues throughout for technique. Quad sets x 10 SAQ x 10 SLR x 10 Heel slides x 10  Self Care: Risks of infection, importance of following post-op precautions, need to focus on straightening leg, review of HEP, perform on bed for safety as getting up and down from floor is fall risk and would not be safe or comfortable on knees, POC for back as well, education on TrDN.    PATIENT EDUCATION:  Education details: HEP update Person educated: Patient Education method: Explanation, Demonstration, Verbal cues, and Handouts Education comprehension: verbalized understanding and returned demonstration  HOME EXERCISE PROGRAM: Access Code: [email or web address] URL: [email or web address] Date: 10/23/2023 Prepared by: Harrie Foreman  Exercises - Supine Quad Set  - 2 x daily - 7 x weekly - 2 sets - 10 reps - 5-10 sec hold - Supine Short Arc Quad  - 2 x daily - 7 x weekly - 2 sets - 10 reps - Straight Leg Raise  - 2 x daily - 7 x weekly - 2 sets - 10 reps - Supine Heel Slide with Strap  - 2 x daily - 7 x weekly - 2 sets - 10 reps - Mini Squat with Counter Support  - 2 x daily - 7 x weekly - 2 sets - 10 reps - Sit to Stand without Arm Support  - 2 x daily - 7 x weekly - 2 sets - 10 reps - Seated Knee Flexion Stretch  - 2 x daily - 7 x weekly - 2 sets - 10 reps - Seated Hamstring Stretch  - 2 x daily - 7 x weekly - 3 sets - 10 reps - Standing Hip Abduction with  Counter Support  -  2 x daily - 7 x weekly - 3 sets - 10 reps - Heel Raises with Counter Support  - 2 x daily - 7 x weekly - 3 sets - 10 reps - Standing Hip Extension with Counter Support  - 2 x daily - 7 x weekly - 3 sets - 10 reps - Gastroc Stretch on Wall  - 2 x daily - 7 x weekly - 3 sets - 10 reps - Seated Knee Extension Stretch with Chair  - 1 x daily - 7 x weekly - 1 sets - 1 reps - 5 min hold - Standing Knee Flexion  - 1 x daily - 7 x weekly - 2-3 sets - 10 reps - [employer] P[employer]  - 1 x daily - 7 x weekly - 2-3 sets - 10 reps  ASSESSMENT:  CLINICAL IMPRESSION: Kristin Benjamin reported good response to TrDN last session, able to catch up with sleep, although still having intermittent sciatica symptoms, not as severe.  Has been compliant with HEP making good progress with ROM and functional strength, has been using cane now instead of walker.  Today again performed TrDN, with very strong twitch response in piriformis, followed by review and progression of HEP for knee.  ROM of knee is progressing, measured 8-103 today.   Kristin Benjamin continues to demonstrate potential for improvement and would benefit from continued skilled therapy to address impairments.    OBJECTIVE IMPAIRMENTS: Abnormal gait, decreased activity tolerance, decreased balance, decreased endurance, decreased mobility, difficulty walking, decreased ROM, decreased strength, increased edema, increased fascial restrictions, increased muscle spasms, impaired flexibility, and pain.   ACTIVITY LIMITATIONS: carrying, lifting, bending, sitting, standing, squatting, sleeping, stairs, transfers, locomotion level, and caring for others  PARTICIPATION LIMITATIONS: meal prep, cleaning, laundry, driving, shopping, community activity, yard work, and leisure activities  PERSONAL FACTORS: Time since onset of injury/illness/exacerbation and 1-2 comorbidities: low back pain, history of L TKA, R ankle pain, HTN  are also affecting patient's  functional outcome.   REHAB POTENTIAL: Excellent  CLINICAL DECISION MAKING: Evolving/moderate complexity  EVALUATION COMPLEXITY: Moderate   GOALS: Goals reviewed with patient? Yes   SHORT TERM GOALS: Target date: 11/07/2023   Independent with initial HEP. Baseline:  Goal status: MET 10/23/23 good compliance, independent.   2.  Patient will report 50% improvement in LBP/sciatica symptoms.  Baseline: 4/10 pain, interrupts sleep Goal status: IN PROGRESS 10/23/23 30-40% improvement   LONG TERM GOALS: Target date: 11/28/2023   Independent with advanced/ongoing HEP to improve outcomes and carryover.  Baseline:  Goal status: IN PROGRESS  2.  Kristin Benjamin will demonstrate right knee flexion to 120 deg to ascend/descend stairs. Baseline: 90 Goal status: IN PROGRESS 10/23/23 103  3.  Kristin Benjamin will demonstrate full right knee extension for safety with gait. Baseline: lacking 15 Goal status: IN PROGRESS 10/23/23 lacking 8  4.  Kristin Benjamin will be able to ambulate 600' safely without and normal gait pattern to access community.  Baseline: using SPC Goal status: IN PROGRESS  5.  Kristin Benjamin will be able to ascend/descend stairs with 1 HR and reciprocal step pattern safely to access home and community.  Baseline: step to gait Goal status: IN PROGRESS  6.  Kristin Benjamin will demonstrate > 20/30 on FGA to demonstrate decreased risk of falls.   Baseline: NT Goal status: IN PROGRESS  7.  Kristin Benjamin will demonstrate improved functional LE strength by completing 5x STS in <14 seconds.  Baseline: NT  Goal status: IN PROGRESS  8.  Kristin Benjamin will report < 18/45 on Modified Oswestry to demonstrate improved quality of life.  Baseline: 24/45 on Modified Oswestry Goal status: IN PROGRESS  9.  Kristin Benjamin will report 75% improvement in LBP/sciatica symptoms.  Baseline:  Goal status: IN PROGRESS    PLAN:  PT FREQUENCY: 2-3x/week  PT  DURATION: 6 weeks  PLANNED INTERVENTIONS: 97164- PT Re-evaluation, 97110-Therapeutic exercises, 97530- Therapeutic activity, 97112- Neuromuscular re-education, 97535- Self Care, [id number]- Manual therapy, [id number]- Gait training, [phone]- Orthotic Fit/training, 97014- Electrical stimulation (unattended), [id number]- Electrical stimulation (manual), [id number]- Vasopneumatic device, [id number]- Ultrasound, [id number]- Traction (mechanical), Patient/Family education, Balance training, Stair training, Taping, Dry Needling, Joint mobilization, Joint manipulation, Spinal manipulation, Spinal mobilization, Scar mobilization, Cryotherapy, and Moist heat  PLAN FOR NEXT SESSION: review and progress HEP for knee, progress LE strength, balance and ROM.  Measure ROM weekly. Focus on extension.  Modalities PRN.  For back - TrDN to R glutes to start.    Jena Gauss, PT 10/23/2023, 3:12 PM

## 2023-10-25 ENCOUNTER — Encounter: Payer: Self-pay | Admitting: Physical Therapy

## 2023-10-25 ENCOUNTER — Ambulatory Visit: Payer: Medicare Other | Admitting: Physical Therapy

## 2023-10-25 DIAGNOSIS — M25661 Stiffness of right knee, not elsewhere classified: Secondary | ICD-10-CM | POA: Diagnosis not present

## 2023-10-25 DIAGNOSIS — R600 Localized edema: Secondary | ICD-10-CM | POA: Diagnosis not present

## 2023-10-25 DIAGNOSIS — M25561 Pain in right knee: Secondary | ICD-10-CM

## 2023-10-25 DIAGNOSIS — M5432 Sciatica, left side: Secondary | ICD-10-CM | POA: Diagnosis not present

## 2023-10-25 DIAGNOSIS — M6281 Muscle weakness (generalized): Secondary | ICD-10-CM | POA: Diagnosis not present

## 2023-10-25 DIAGNOSIS — M5441 Lumbago with sciatica, right side: Secondary | ICD-10-CM | POA: Diagnosis not present

## 2023-10-25 DIAGNOSIS — M5431 Sciatica, right side: Secondary | ICD-10-CM | POA: Diagnosis not present

## 2023-10-25 NOTE — Therapy (Signed)
OUTPATIENT PHYSICAL THERAPY LOWER EXTREMITY TREATMENT   Patient Name: Kristin Benjamin MRN: [id number] DOB:07-Oct-1956, 67 y.o., female Today's Date: 10/25/2023  END OF SESSION:  PT End of Session - 10/25/23 1011     Visit Number 4    Date for PT Re-Evaluation 11/28/23    Authorization Type [employer]    Progress Note Due on Visit 10    PT Start Time 0933    PT Stop Time 1011    PT Time Calculation (min) 38 min    Activity Tolerance Patient tolerated treatment well    Behavior During Therapy [facility] for tasks assessed/performed              Past Medical History:  Diagnosis Date   Allergy    Anxiety    Arthritis    Depression    Headache    history of   Heart murmur    in the past   Hx of adenomatous colonic polyps 07/17/2017   Hyperlipidemia    Hypertension    Past Surgical History:  Procedure Laterality Date   bladder tack     COLONOSCOPY  2006   and 2018   FUNCTIONAL ENDOSCOPIC SINUS SURGERY     JOINT REPLACEMENT     Left total knee arthroplasty Dr. Magnus Ivan 10-18-18   KNEE ARTHROSCOPY W/ MENISCAL REPAIR Right 08/2016   TOTAL KNEE ARTHROPLASTY Left 10/18/2018   Procedure: LEFT TOTAL KNEE ARTHROPLASTY;  Surgeon: Kathryne Hitch, MD;  Location: [facility];  Service: Orthopedics;  Laterality: Left;   TOTAL KNEE ARTHROPLASTY Right 09/28/2023   Procedure: RIGHT TOTAL KNEE ARTHROPLASTY;  Surgeon: Kathryne Hitch, MD;  Location: [facility];  Service: Orthopedics;  Laterality: Right;   TUBAL LIGATION  1993   Patient Active Problem List   Diagnosis Date Noted   Status post total right knee replacement 09/28/2023   Frequent UTI 06/25/2023   Coronary artery disease involving native coronary artery of native heart without angina pectoris 05/21/2023   Aortic atherosclerosis (HCC) 05/21/2023   Acute cystitis without hematuria 04/17/2023   High risk medication use 12/18/2022   Attention deficit hyperactivity disorder (ADHD) 05/18/2022   Mixed hyperlipidemia  04/27/2022   Primary hypertension 04/27/2022   Anxiety and depression 04/27/2022   Pes anserinus bursitis of right knee 02/16/2020   Status post total left knee replacement 10/18/2018   Chronic pain of right knee 03/28/2018   Hx of adenomatous colonic polyps 07/17/2017    PCP: Clayborne Dana, NP   REFERRING PROVIDER: Kathryne Hitch*   REFERRING DIAG: N82.95,A21.30 (ICD-10-CM) - Bilateral sciatica   THERAPY DIAG:  Acute pain of right knee  Stiffness of right knee, not elsewhere classified  Muscle weakness (generalized)  Rationale for Evaluation and Treatment: Rehabilitation  ONSET DATE: s/p R TKR on 09/28/2023  SUBJECTIVE:   SUBJECTIVE STATEMENT:  Sciatica is still a big issue for me, can we try massage instead of dry needling today?    NEXT MD VISIT: 11/14/2022 with Dr. Magnus Ivan  PERTINENT HISTORY: L TKA 2019, R ankle pain, HTN, low back pain PAIN:  Are you having pain? Yes: NPRS scale: 1-3/10 Pain location: R knee Pain description: ache Aggravating factors: bending Relieving factors: ice  Are you having pain? Yes: NPRS scale: 2/10 Pain location: R knee  Pain description: toothache, stiff  Aggravating factors: bending knee, overuse  Relieving factors: ice, movement   PRECAUTIONS: None  RED FLAGS: None   WEIGHT BEARING RESTRICTIONS: No  FALLS:  Has patient fallen in last  6 months? No  LIVING ENVIRONMENT: Lives with: lives with their spouse Lives in: House/apartment Stairs: Yes: Internal: 14 steps; on left going up and External: 2 steps; none Has following equipment at home: Single point cane and Walker - 2 wheeled  OCCUPATION: retired  PLOF: Independent and Leisure: pickleball, swim, walk  PATIENT GOALS: get back to walking, pickleball, and activities   OBJECTIVE:  Note: Objective measures were completed at Evaluation unless otherwise noted.  DIAGNOSTIC FINDINGS: DG R knee 09/28/23 IMPRESSION: Well-positioned right total knee  arthroplasty.  No imaging of low back  PATIENT SURVEYS:  Modified Oswestry 24/45   COGNITION: Overall cognitive status: Within functional limits for tasks assessed     SENSATION: WFL  EDEMA:  Circumferential: 45cm R, 41.5 cm L knee  MUSCLE LENGTH: NT  POSTURE: No Significant postural limitations  PALPATION: Tenderness R glutes/piriformis. Incision covered with steristrips.  No tenderness in calf.  No signs of infection.   LUMBAR ROM:   NT today due to R knee pain and poor tolerance to standing.   Active  A/PROM  eval  Flexion   Extension   Right lateral flexion   Left lateral flexion   Right rotation   Left rotation    (Blank rows = not tested)    LOWER EXTREMITY ROM:  Active ROM Right eval Left eval Right 10/23/23  Knee flexion 90 130 103  Knee extension Lacking 15 0 Lacking 8   (Blank rows = not tested)  LOWER EXTREMITY MMT:  MMT Right eval Left eval  Hip flexion 3 5  Hip extension    Hip abduction    Hip adduction    Knee flexion 3+ 5  Knee extension 3+ 5  Ankle dorsiflexion 5 5  Ankle plantarflexion     (Blank rows = not tested)  LUMBAR SPECIAL TESTS:  Slump test: Negative   FUNCTIONAL TESTS:  5 times sit to stand: deferred  GAIT: Distance walked: 50' Assistive device utilized: Single point cane Level of assistance: Modified independence Comments: slightly antalgic, slightly decreased speed.     TODAY'S TREATMENT:                                                                                                                              DATE:    10/25/23  TherEx  Nustep L5 x8 minutes BLEs  Knee flexion stretch 15x5 seconds holds 8 inch box  HS stretches 3x30 seconds R LE Seated knee flexion stretch 15x5 second holds from chair  Forward step ups 4 inch box x12 Lateral step ups 4 inch box x12 HS curls with thigh perpendicular to the ground 15x5 seconds   Manual  IASTM green ball R glutes/piriformis and hams prone,  progressed to percussion gun x10 min    10/23/23 Therapeutic Exercise: to improve strength and mobility.  Demo, verbal and tactile cues throughout for technique. Nustep L5 x 6 min  Prone knee bends RLE x 10 Contract relax to  quads with therapist progressively bending knee with rotational movements to help relax glutes to promote knee flexion.  Prone hip extension 2 x 5 bil  Standing toe raises x10 Standing hamstring curls x 5 r/l Standing hip extension 2 x 10 r/l Standing hip abduction x 10 r/l Sit to stands with feet even x 10 Church pews x 15 In prone - contract relax to quads with therapist progressively bending knee with rotational movements to help relax glutes to promote knee flexion.  Manual Therapy: to decrease muscle spasm and pain and improve mobility STM/TPR to R glutes and piriformis, skilled palpation and monitoring during dry needling. Trigger Point Dry-Needling  Treatment instructions: Expect mild to moderate muscle soreness. S/S of pneumothorax if dry needled over a lung field, and to seek immediate medical attention should they occur. Patient verbalized understanding of these instructions and education. Patient Consent Given: Yes Education handout provided: Previously provided Muscles treated: R glut med/max, R piriformis Electrical stimulation performed: No Parameters: N/A Treatment response/outcome: Twitch Response Elicited and Palpable Increase in Muscle Length  10/18/23 Therapeutic Exercise: to improve strength and mobility.  Demo, verbal and tactile cues throughout for technique. Bike seat 8 L1 x 6 min - retro full revolutions Seated HS stretch RLE before and after manual Piriformis stretches supine -challenging due to R knee, SKTC stretch best Standing gastroc stretch Review of standing exercises - heel raises, hip extension, hip abducution Manual Therapy: to decrease muscle spasm and pain and improve mobility STM/TPR to R glutes and piriformis, skilled  palpation and monitoring during dry needling. Trigger Point Dry-Needling  Treatment instructions: Expect mild to moderate muscle soreness. S/S of pneumothorax if dry needled over a lung field, and to seek immediate medical attention should they occur. Patient verbalized understanding of these instructions and education. Patient Consent Given: Yes Education handout provided: Yes Muscles treated: R glut med/max, R piriformis Electrical stimulation performed: No Parameters: N/A Treatment response/outcome: Twitch Response Elicited and Palpable Increase in Muscle Length   10/17/23 Therapeutic Exercise: to improve strength and mobility.  Demo, verbal and tactile cues throughout for technique. Quad sets x 10 SAQ x 10 SLR x 10 Heel slides x 10  Self Care: Risks of infection, importance of following post-op precautions, need to focus on straightening leg, review of HEP, perform on bed for safety as getting up and down from floor is fall risk and would not be safe or comfortable on knees, POC for back as well, education on TrDN.    PATIENT EDUCATION:  Education details: HEP update Person educated: Patient Education method: Explanation, Demonstration, Verbal cues, and Handouts Education comprehension: verbalized understanding and returned demonstration  HOME EXERCISE PROGRAM: Access Code: [email or web address] URL: [email or web address] Date: 10/23/2023 Prepared by: Harrie Foreman  Exercises - Supine Quad Set  - 2 x daily - 7 x weekly - 2 sets - 10 reps - 5-10 sec hold - Supine Short Arc Quad  - 2 x daily - 7 x weekly - 2 sets - 10 reps - Straight Leg Raise  - 2 x daily - 7 x weekly - 2 sets - 10 reps - Supine Heel Slide with Strap  - 2 x daily - 7 x weekly - 2 sets - 10 reps - Mini Squat with Counter Support  - 2 x daily - 7 x weekly - 2 sets - 10 reps - Sit to Stand without Arm Support  - 2 x daily - 7 x weekly - 2 sets - 10 reps - Seated Knee Flexion Stretch  -  2 x daily - 7 x  weekly - 2 sets - 10 reps - Seated Hamstring Stretch  - 2 x daily - 7 x weekly - 3 sets - 10 reps - Standing Hip Abduction with Counter Support  - 2 x daily - 7 x weekly - 3 sets - 10 reps - Heel Raises with Counter Support  - 2 x daily - 7 x weekly - 3 sets - 10 reps - Standing Hip Extension with Counter Support  - 2 x daily - 7 x weekly - 3 sets - 10 reps - Gastroc Stretch on Wall  - 2 x daily - 7 x weekly - 3 sets - 10 reps - Seated Knee Extension Stretch with Chair  - 1 x daily - 7 x weekly - 1 sets - 1 reps - 5 min hold - Standing Knee Flexion  - 1 x daily - 7 x weekly - 2-3 sets - 10 reps - [employer] P[employer]  - 1 x daily - 7 x weekly - 2-3 sets - 10 reps  ASSESSMENT:  CLINICAL IMPRESSION:  Pt arrived today doing well, we worked on knee ROM and strength today, also tried percussion gun to right piriforms/glutes/hams with great results in terms of mm pain and sciatica. Will continue efforts/progress as appropriate.   OBJECTIVE IMPAIRMENTS: Abnormal gait, decreased activity tolerance, decreased balance, decreased endurance, decreased mobility, difficulty walking, decreased ROM, decreased strength, increased edema, increased fascial restrictions, increased muscle spasms, impaired flexibility, and pain.   ACTIVITY LIMITATIONS: carrying, lifting, bending, sitting, standing, squatting, sleeping, stairs, transfers, locomotion level, and caring for others  PARTICIPATION LIMITATIONS: meal prep, cleaning, laundry, driving, shopping, community activity, yard work, and leisure activities  PERSONAL FACTORS: Time since onset of injury/illness/exacerbation and 1-2 comorbidities: low back pain, history of L TKA, R ankle pain, HTN  are also affecting patient's functional outcome.   REHAB POTENTIAL: Excellent  CLINICAL DECISION MAKING: Evolving/moderate complexity  EVALUATION COMPLEXITY: Moderate   GOALS: Goals reviewed with patient? Yes   SHORT TERM GOALS: Target date: 11/07/2023   Independent with  initial HEP. Baseline:  Goal status: MET 10/23/23 good compliance, independent.   2.  Patient will report 50% improvement in LBP/sciatica symptoms.  Baseline: 4/10 pain, interrupts sleep Goal status: IN PROGRESS 10/23/23 30-40% improvement   LONG TERM GOALS: Target date: 11/28/2023   Independent with advanced/ongoing HEP to improve outcomes and carryover.  Baseline:  Goal status: IN PROGRESS  2.  Kristin Benjamin will demonstrate right knee flexion to 120 deg to ascend/descend stairs. Baseline: 90 Goal status: IN PROGRESS 10/23/23 103  3.  Kristin Benjamin will demonstrate full right knee extension for safety with gait. Baseline: lacking 15 Goal status: IN PROGRESS 10/23/23 lacking 8  4.  Kristin Benjamin will be able to ambulate 600' safely without and normal gait pattern to access community.  Baseline: using SPC Goal status: IN PROGRESS  5.  Kristin Benjamin will be able to ascend/descend stairs with 1 HR and reciprocal step pattern safely to access home and community.  Baseline: step to gait Goal status: IN PROGRESS  6.  Kristin Benjamin will demonstrate > 20/30 on FGA to demonstrate decreased risk of falls.   Baseline: NT Goal status: IN PROGRESS  7.  Kristin Benjamin will demonstrate improved functional LE strength by completing 5x STS in <14 seconds.  Baseline: NT Goal status: IN PROGRESS  8.  Kristin Benjamin will report < 18/45 on Modified Oswestry to demonstrate improved quality  of life.  Baseline: 24/45 on Modified Oswestry Goal status: IN PROGRESS  9.  Kristin Benjamin will report 75% improvement in LBP/sciatica symptoms.  Baseline:  Goal status: IN PROGRESS    PLAN:  PT FREQUENCY: 2-3x/week  PT DURATION: 6 weeks  PLANNED INTERVENTIONS: 97164- PT Re-evaluation, 97110-Therapeutic exercises, 97530- Therapeutic activity, 97112- Neuromuscular re-education, 97535- Self Care, 32440- Manual therapy, [id number]- Gait training, [phone]- Orthotic Fit/training, 97014-  Electrical stimulation (unattended), [id number]- Electrical stimulation (manual), [id number]- Vasopneumatic device, [id number]- Ultrasound, [id number]- Traction (mechanical), Patient/Family education, Balance training, Stair training, Taping, Dry Needling, Joint mobilization, Joint manipulation, Spinal manipulation, Spinal mobilization, Scar mobilization, Cryotherapy, and Moist heat  PLAN FOR NEXT SESSION: review and progress HEP for knee, progress LE strength, balance and ROM.  Measure ROM weekly. Focus on extension.  Modalities PRN.  Prefers percussion gun to Dn   Nedra Hai, PT, DPT 10/25/23 10:11 AM

## 2023-10-30 ENCOUNTER — Ambulatory Visit: Payer: Medicare Other

## 2023-10-30 ENCOUNTER — Telehealth: Payer: Self-pay | Admitting: Family Medicine

## 2023-10-30 DIAGNOSIS — R600 Localized edema: Secondary | ICD-10-CM | POA: Diagnosis not present

## 2023-10-30 DIAGNOSIS — M5431 Sciatica, right side: Secondary | ICD-10-CM | POA: Diagnosis not present

## 2023-10-30 DIAGNOSIS — M25561 Pain in right knee: Secondary | ICD-10-CM | POA: Diagnosis not present

## 2023-10-30 DIAGNOSIS — M5441 Lumbago with sciatica, right side: Secondary | ICD-10-CM | POA: Diagnosis not present

## 2023-10-30 DIAGNOSIS — M6281 Muscle weakness (generalized): Secondary | ICD-10-CM | POA: Diagnosis not present

## 2023-10-30 DIAGNOSIS — M25661 Stiffness of right knee, not elsewhere classified: Secondary | ICD-10-CM

## 2023-10-30 DIAGNOSIS — M5432 Sciatica, left side: Secondary | ICD-10-CM | POA: Diagnosis not present

## 2023-10-30 NOTE — Therapy (Signed)
OUTPATIENT PHYSICAL THERAPY LOWER EXTREMITY TREATMENT   Patient Name: Kristin Benjamin MRN: [id number] DOB:1956-05-25, 67 y.o., female Today's Date: 10/30/2023  END OF SESSION:  PT End of Session - 10/30/23 1141     Visit Number 5    Date for PT Re-Evaluation 11/28/23    Authorization Type [employer]    Progress Note Due on Visit 10    PT Start Time 1101    PT Stop Time 1146    PT Time Calculation (min) 45 min    Activity Tolerance Patient tolerated treatment well    Behavior During Therapy [facility] for tasks assessed/performed               Past Medical History:  Diagnosis Date   Allergy    Anxiety    Arthritis    Depression    Headache    history of   Heart murmur    in the past   Hx of adenomatous colonic polyps 07/17/2017   Hyperlipidemia    Hypertension    Past Surgical History:  Procedure Laterality Date   bladder tack     COLONOSCOPY  2006   and 2018   FUNCTIONAL ENDOSCOPIC SINUS SURGERY     JOINT REPLACEMENT     Left total knee arthroplasty Dr. Magnus Ivan 10-18-18   KNEE ARTHROSCOPY W/ MENISCAL REPAIR Right 08/2016   TOTAL KNEE ARTHROPLASTY Left 10/18/2018   Procedure: LEFT TOTAL KNEE ARTHROPLASTY;  Surgeon: Kathryne Hitch, MD;  Location: [facility];  Service: Orthopedics;  Laterality: Left;   TOTAL KNEE ARTHROPLASTY Right 09/28/2023   Procedure: RIGHT TOTAL KNEE ARTHROPLASTY;  Surgeon: Kathryne Hitch, MD;  Location: [facility];  Service: Orthopedics;  Laterality: Right;   TUBAL LIGATION  1993   Patient Active Problem List   Diagnosis Date Noted   Status post total right knee replacement 09/28/2023   Frequent UTI 06/25/2023   Coronary artery disease involving native coronary artery of native heart without angina pectoris 05/21/2023   Aortic atherosclerosis (HCC) 05/21/2023   Acute cystitis without hematuria 04/17/2023   High risk medication use 12/18/2022   Attention deficit hyperactivity disorder (ADHD) 05/18/2022   Mixed hyperlipidemia  04/27/2022   Primary hypertension 04/27/2022   Anxiety and depression 04/27/2022   Pes anserinus bursitis of right knee 02/16/2020   Status post total left knee replacement 10/18/2018   Chronic pain of right knee 03/28/2018   Hx of adenomatous colonic polyps 07/17/2017    PCP: Clayborne Dana, NP   REFERRING PROVIDER: Kathryne Hitch*   REFERRING DIAG: W41.32,G40.10 (ICD-10-CM) - Bilateral sciatica   THERAPY DIAG:  Acute pain of right knee  Stiffness of right knee, not elsewhere classified  Muscle weakness (generalized)  Localized edema  Acute right-sided low back pain with right-sided sciatica  Rationale for Evaluation and Treatment: Rehabilitation  ONSET DATE: s/p R TKR on 09/28/2023  SUBJECTIVE:   SUBJECTIVE STATEMENT:  Pt reports no R knee pain, mild LBP today.    NEXT MD VISIT: 11/14/2022 with Dr. Magnus Ivan  PERTINENT HISTORY: L TKA 2019, R ankle pain, HTN, low back pain PAIN:  Are you having pain? Yes: NPRS scale: 0/10 Pain location: R knee Pain description: ache Aggravating factors: bending Relieving factors: ice  Are you having pain? Yes: NPRS scale: 2/10 Pain location: Low back Pain description: toothache, stiff  Aggravating factors: bending knee, overuse  Relieving factors: ice, movement   PRECAUTIONS: None  RED FLAGS: None   WEIGHT BEARING RESTRICTIONS: No  FALLS:  Has  patient fallen in last 6 months? No  LIVING ENVIRONMENT: Lives with: lives with their spouse Lives in: House/apartment Stairs: Yes: Internal: 14 steps; on left going up and External: 2 steps; none Has following equipment at home: Single point cane and Walker - 2 wheeled  OCCUPATION: retired  PLOF: Independent and Leisure: pickleball, swim, walk  PATIENT GOALS: get back to walking, pickleball, and activities   OBJECTIVE:  Note: Objective measures were completed at Evaluation unless otherwise noted.  DIAGNOSTIC FINDINGS: DG R knee 09/28/23  IMPRESSION: Well-positioned right total knee arthroplasty.  No imaging of low back  PATIENT SURVEYS:  Modified Oswestry 24/45   COGNITION: Overall cognitive status: Within functional limits for tasks assessed     SENSATION: WFL  EDEMA:  Circumferential: 45cm R, 41.5 cm L knee  MUSCLE LENGTH: NT  POSTURE: No Significant postural limitations  PALPATION: Tenderness R glutes/piriformis. Incision covered with steristrips.  No tenderness in calf.  No signs of infection.   LUMBAR ROM:   NT today due to R knee pain and poor tolerance to standing.   Active  A/PROM  eval  Flexion   Extension   Right lateral flexion   Left lateral flexion   Right rotation   Left rotation    (Blank rows = not tested)    LOWER EXTREMITY ROM:  Active ROM Right eval Left eval Right 10/23/23  Knee flexion 90 130 103  Knee extension Lacking 15 0 Lacking 8   (Blank rows = not tested)  LOWER EXTREMITY MMT:  MMT Right eval Left eval  Hip flexion 3 5  Hip extension    Hip abduction    Hip adduction    Knee flexion 3+ 5  Knee extension 3+ 5  Ankle dorsiflexion 5 5  Ankle plantarflexion     (Blank rows = not tested)  LUMBAR SPECIAL TESTS:  Slump test: Negative   FUNCTIONAL TESTS:  5 times sit to stand: deferred  GAIT: Distance walked: 50' Assistive device utilized: Single point cane Level of assistance: Modified independence Comments: slightly antalgic, slightly decreased speed.     TODAY'S TREATMENT:                                                                                                                              DATE:   10/30/23 Therapeutic Exercise: to improve strength and mobility.  Demo, verbal and tactile cues throughout for technique. Bike L1x25min Lateral step ups 4' 2x10 Fwd step ups 4' x 12 Runner stretch with therapist blocking tibia for extension  Stiff leg deadlift x 10 1hand support Seated R hamstring stretch leg propped on table 2x30"  Manual  Therapy: Supine R tibiofemoral AP mobs  and passive knee extension  Performed by Jena Gauss, PT STM/TPR to R glutes, piriformis, skilled palpation and monitoring during dry needling. Trigger Point Dry-Needling  Treatment instructions: Expect mild to moderate muscle soreness. S/S of pneumothorax if dry needled over a lung field, and  to seek immediate medical attention should they occur. Patient verbalized understanding of these instructions and education. Patient Consent Given: Yes Education handout provided: No Muscles treated: R glute med, R piriformis Electrical stimulation performed: No Parameters: N/A Treatment response/outcome: Twitch Response Elicited and Palpable Increase in Muscle Length  10/25/23 TherEx  Nustep L5 x8 minutes BLEs  Knee flexion stretch 15x5 seconds holds 8 inch box  HS stretches 3x30 seconds R LE Seated knee flexion stretch 15x5 second holds from chair  Forward step ups 4 inch box x12 Lateral step ups 4 inch box x12 HS curls with thigh perpendicular to the ground 15x5 seconds   Manual  IASTM green ball R glutes/piriformis and hams prone, progressed to percussion gun x10 min    10/23/23 Therapeutic Exercise: to improve strength and mobility.  Demo, verbal and tactile cues throughout for technique. Nustep L5 x 6 min  Prone knee bends RLE x 10 Contract relax to quads with therapist progressively bending knee with rotational movements to help relax glutes to promote knee flexion.  Prone hip extension 2 x 5 bil  Standing toe raises x10 Standing hamstring curls x 5 r/l Standing hip extension 2 x 10 r/l Standing hip abduction x 10 r/l Sit to stands with feet even x 10 Church pews x 15 In prone - contract relax to quads with therapist progressively bending knee with rotational movements to help relax glutes to promote knee flexion.  Manual Therapy: to decrease muscle spasm and pain and improve mobility STM/TPR to R glutes and piriformis, skilled  palpation and monitoring during dry needling. Trigger Point Dry-Needling  Treatment instructions: Expect mild to moderate muscle soreness. S/S of pneumothorax if dry needled over a lung field, and to seek immediate medical attention should they occur. Patient verbalized understanding of these instructions and education. Patient Consent Given: Yes Education handout provided: Previously provided Muscles treated: R glut med/max, R piriformis Electrical stimulation performed: No Parameters: N/A Treatment response/outcome: Twitch Response Elicited and Palpable Increase in Muscle Length    PATIENT EDUCATION:  Education details: HEP update Person educated: Patient Education method: Explanation, Demonstration, Verbal cues, and Handouts Education comprehension: verbalized understanding and returned demonstration  HOME EXERCISE PROGRAM: Access Code: [email or web address] URL: [email or web address] Date: 10/23/2023 Prepared by: Harrie Foreman  Exercises - Supine Quad Set  - 2 x daily - 7 x weekly - 2 sets - 10 reps - 5-10 sec hold - Supine Short Arc Quad  - 2 x daily - 7 x weekly - 2 sets - 10 reps - Straight Leg Raise  - 2 x daily - 7 x weekly - 2 sets - 10 reps - Supine Heel Slide with Strap  - 2 x daily - 7 x weekly - 2 sets - 10 reps - Mini Squat with Counter Support  - 2 x daily - 7 x weekly - 2 sets - 10 reps - Sit to Stand without Arm Support  - 2 x daily - 7 x weekly - 2 sets - 10 reps - Seated Knee Flexion Stretch  - 2 x daily - 7 x weekly - 2 sets - 10 reps - Seated Hamstring Stretch  - 2 x daily - 7 x weekly - 3 sets - 10 reps - Standing Hip Abduction with Counter Support  - 2 x daily - 7 x weekly - 3 sets - 10 reps - Heel Raises with Counter Support  - 2 x daily - 7 x weekly - 3 sets - 10 reps - Standing Hip Extension  with Counter Support  - 2 x daily - 7 x weekly - 3 sets - 10 reps - Gastroc Stretch on Wall  - 2 x daily - 7 x weekly - 3 sets - 10 reps - Seated Knee  Extension Stretch with Chair  - 1 x daily - 7 x weekly - 1 sets - 1 reps - 5 min hold - Standing Knee Flexion  - 1 x daily - 7 x weekly - 2-3 sets - 10 reps - Church Pew  - 1 x daily - 7 x weekly - 2-3 sets - 10 reps  ASSESSMENT:  CLINICAL IMPRESSION: Pt responded well to treatment. Focused interventions on R knee extension, with good progress made visibly (ROM not formally measured but visually had full extension afterwards). The deadlifts and lying supine seemed to aggravate her sciatica, so supervising PT available to do DN to address pain.  She had good response to manual therapy to glutes/R piriformis and reported decreased sciatic pain after interventions.  Kristin Benjamin continues to demonstrate potential for improvement and would benefit from continued skilled therapy to address impairments.     OBJECTIVE IMPAIRMENTS: Abnormal gait, decreased activity tolerance, decreased balance, decreased endurance, decreased mobility, difficulty walking, decreased ROM, decreased strength, increased edema, increased fascial restrictions, increased muscle spasms, impaired flexibility, and pain.   ACTIVITY LIMITATIONS: carrying, lifting, bending, sitting, standing, squatting, sleeping, stairs, transfers, locomotion level, and caring for others   PARTICIPATION LIMITATIONS: meal prep, cleaning, laundry, driving, shopping, community activity, yard work, and leisure activities  PERSONAL FACTORS: Time since onset of injury/illness/exacerbation and 1-2 comorbidities: low back pain, history of L TKA, R ankle pain, HTN  are also affecting patient's functional outcome.   REHAB POTENTIAL: Excellent  CLINICAL DECISION MAKING: Evolving/moderate complexity  EVALUATION COMPLEXITY: Moderate   GOALS: Goals reviewed with patient? Yes   SHORT TERM GOALS: Target date: 11/07/2023   Independent with initial HEP. Baseline:  Goal status: MET 10/23/23 good compliance, independent.   2.  Patient will report 50%  improvement in LBP/sciatica symptoms.  Baseline: 4/10 pain, interrupts sleep Goal status: IN PROGRESS 10/23/23 30-40% improvement   LONG TERM GOALS: Target date: 11/28/2023   Independent with advanced/ongoing HEP to improve outcomes and carryover.  Baseline:  Goal status: IN PROGRESS  2.  Kristin Benjamin will demonstrate right knee flexion to 120 deg to ascend/descend stairs. Baseline: 90 Goal status: IN PROGRESS 10/23/23 103  3.  Kristin Benjamin will demonstrate full right knee extension for safety with gait. Baseline: lacking 15 Goal status: IN PROGRESS 10/23/23 lacking 8  4.  Kristin Benjamin will be able to ambulate 600' safely without and normal gait pattern to access community.  Baseline: using SPC Goal status: IN PROGRESS  5.  Kristin Benjamin will be able to ascend/descend stairs with 1 HR and reciprocal step pattern safely to access home and community.  Baseline: step to gait Goal status: IN PROGRESS  6.  Kristin Benjamin will demonstrate > 20/30 on FGA to demonstrate decreased risk of falls.   Baseline: NT Goal status: IN PROGRESS  7.  Kristin Benjamin will demonstrate improved functional LE strength by completing 5x STS in <14 seconds.  Baseline: NT Goal status: IN PROGRESS  8.  Kristin Benjamin will report < 18/45 on Modified Oswestry to demonstrate improved quality of life.  Baseline: 24/45 on Modified Oswestry Goal status: IN PROGRESS  9.  Kristin Benjamin will report 75% improvement in LBP/sciatica symptoms.  Baseline:  Goal status: IN PROGRESS    PLAN:  PT FREQUENCY: 2-3x/week  PT DURATION: 6 weeks  PLANNED INTERVENTIONS: 97164- PT Re-evaluation, 97110-Therapeutic exercises, 97530- Therapeutic activity, 97112- Neuromuscular re-education, 97535- Self Care, 40981- Manual therapy, [id number]- Gait training, [phone]- Orthotic Fit/training, 97014- Electrical stimulation (unattended), [id number]- Electrical stimulation (manual), [id number]- Vasopneumatic device, [id number]-  Ultrasound, [id number]- Traction (mechanical), Patient/Family education, Balance training, Stair training, Taping, Dry Needling, Joint mobilization, Joint manipulation, Spinal manipulation, Spinal mobilization, Scar mobilization, Cryotherapy, and Moist heat  PLAN FOR NEXT SESSION: review and progress HEP for knee, progress LE strength, balance and ROM.  Measure ROM weekly. Focus on extension.  Modalities PRN.    Darleene Cleaver, PTA  10/30/23 11:49 AM  Jena Gauss, PT  10/30/2023 3:21 PM

## 2023-10-30 NOTE — Telephone Encounter (Signed)
Mac (Pharmacist [facility]) called stating that he needed to review per PDMP with the provider if it was ok to fill pt's adderall considering she had a script of oxycodone filled on 11.26.24 with [facility] from Dr. Magnus Ivan. He is concerned about the drug interaction b/w the two. Pharmacist is requesting a callback from a provider NOT a CMA.   P: [id number] Ref: [id number]

## 2023-10-31 DIAGNOSIS — L578 Other skin changes due to chronic exposure to nonionizing radiation: Secondary | ICD-10-CM | POA: Diagnosis not present

## 2023-10-31 DIAGNOSIS — L821 Other seborrheic keratosis: Secondary | ICD-10-CM | POA: Diagnosis not present

## 2023-10-31 DIAGNOSIS — D1801 Hemangioma of skin and subcutaneous tissue: Secondary | ICD-10-CM | POA: Diagnosis not present

## 2023-10-31 DIAGNOSIS — L814 Other melanin hyperpigmentation: Secondary | ICD-10-CM | POA: Diagnosis not present

## 2023-10-31 NOTE — Telephone Encounter (Signed)
She is no longer taking Oxycodone. Thanks.

## 2023-10-31 NOTE — Telephone Encounter (Signed)
Spoke with [facility] stating she was no longer on the oxy (surgery related). Should be good to go now!

## 2023-11-01 ENCOUNTER — Ambulatory Visit: Payer: Medicare Other

## 2023-11-01 ENCOUNTER — Other Ambulatory Visit: Payer: Self-pay

## 2023-11-01 DIAGNOSIS — M6281 Muscle weakness (generalized): Secondary | ICD-10-CM

## 2023-11-01 DIAGNOSIS — R600 Localized edema: Secondary | ICD-10-CM

## 2023-11-01 DIAGNOSIS — M25561 Pain in right knee: Secondary | ICD-10-CM

## 2023-11-01 DIAGNOSIS — M25661 Stiffness of right knee, not elsewhere classified: Secondary | ICD-10-CM

## 2023-11-01 NOTE — Therapy (Signed)
OUTPATIENT PHYSICAL THERAPY LOWER EXTREMITY TREATMENT   Patient Name: RICOLE DEASON MRN: [id number] DOB:01-05-1956, 67 y.o., female Today's Date: 11/01/2023  END OF SESSION:  PT End of Session - 11/01/23 1042     Visit Number 6    Date for PT Re-Evaluation 11/28/23    Authorization Type [employer]    Progress Note Due on Visit 10    PT Start Time [phone]    PT Stop Time 1022    PT Time Calculation (min) 46 min    Activity Tolerance Patient tolerated treatment well    Behavior During Therapy [facility] for tasks assessed/performed                Past Medical History:  Diagnosis Date   Allergy    Anxiety    Arthritis    Depression    Headache    history of   Heart murmur    in the past   Hx of adenomatous colonic polyps 07/17/2017   Hyperlipidemia    Hypertension    Past Surgical History:  Procedure Laterality Date   bladder tack     COLONOSCOPY  2006   and 2018   FUNCTIONAL ENDOSCOPIC SINUS SURGERY     JOINT REPLACEMENT     Left total knee arthroplasty Dr. Magnus Ivan 10-18-18   KNEE ARTHROSCOPY W/ MENISCAL REPAIR Right 08/2016   TOTAL KNEE ARTHROPLASTY Left 10/18/2018   Procedure: LEFT TOTAL KNEE ARTHROPLASTY;  Surgeon: Kathryne Hitch, MD;  Location: [facility];  Service: Orthopedics;  Laterality: Left;   TOTAL KNEE ARTHROPLASTY Right 09/28/2023   Procedure: RIGHT TOTAL KNEE ARTHROPLASTY;  Surgeon: Kathryne Hitch, MD;  Location: [facility];  Service: Orthopedics;  Laterality: Right;   TUBAL LIGATION  1993   Patient Active Problem List   Diagnosis Date Noted   Status post total right knee replacement 09/28/2023   Frequent UTI 06/25/2023   Coronary artery disease involving native coronary artery of native heart without angina pectoris 05/21/2023   Aortic atherosclerosis (HCC) 05/21/2023   Acute cystitis without hematuria 04/17/2023   High risk medication use 12/18/2022   Attention deficit hyperactivity disorder (ADHD) 05/18/2022   Mixed  hyperlipidemia 04/27/2022   Primary hypertension 04/27/2022   Anxiety and depression 04/27/2022   Pes anserinus bursitis of right knee 02/16/2020   Status post total left knee replacement 10/18/2018   Chronic pain of right knee 03/28/2018   Hx of adenomatous colonic polyps 07/17/2017    PCP: Clayborne Dana, NP   REFERRING PROVIDER: Kathryne Hitch*   REFERRING DIAG: J19.14,N82.95 (ICD-10-CM) - Bilateral sciatica   THERAPY DIAG:  Acute pain of right knee  Stiffness of right knee, not elsewhere classified  Localized edema  Muscle weakness (generalized)  Rationale for Evaluation and Treatment: Rehabilitation  ONSET DATE: s/p R TKR on 09/28/2023  SUBJECTIVE:   SUBJECTIVE STATEMENT:  Pt with sciatic pain limited her sleep last night.  NEXT MD VISIT: 11/14/2022 with Dr. Magnus Ivan  PERTINENT HISTORY: L TKA 2019, R ankle pain, HTN, low back pain PAIN:  Are you having pain? Yes: NPRS scale: 0/10 Pain location: R knee Pain description: ache Aggravating factors: bending Relieving factors: ice  Are you having pain? Yes: NPRS scale: 2/10 Pain location: Low back Pain description: toothache, stiff  Aggravating factors: bending knee, overuse  Relieving factors: ice, movement   PRECAUTIONS: None  RED FLAGS: None   WEIGHT BEARING RESTRICTIONS: No  FALLS:  Has patient fallen in last 6 months? No  LIVING ENVIRONMENT:  Lives with: lives with their spouse Lives in: House/apartment Stairs: Yes: Internal: 14 steps; on left going up and External: 2 steps; none Has following equipment at home: Single point cane and Walker - 2 wheeled  OCCUPATION: retired  PLOF: Independent and Leisure: pickleball, swim, walk  PATIENT GOALS: get back to walking, pickleball, and activities   OBJECTIVE:  Note: Objective measures were completed at Evaluation unless otherwise noted.  DIAGNOSTIC FINDINGS: DG R knee 09/28/23 IMPRESSION: Well-positioned right total knee  arthroplasty.  No imaging of low back  PATIENT SURVEYS:  Modified Oswestry 24/45   COGNITION: Overall cognitive status: Within functional limits for tasks assessed     SENSATION: WFL  EDEMA:  Circumferential: 45cm R, 41.5 cm L knee  MUSCLE LENGTH: NT  POSTURE: No Significant postural limitations  PALPATION: Tenderness R glutes/piriformis. Incision covered with steristrips.  No tenderness in calf.  No signs of infection.   LUMBAR ROM:   NT today due to R knee pain and poor tolerance to standing.   Active  A/PROM  eval  Flexion   Extension   Right lateral flexion   Left lateral flexion   Right rotation   Left rotation    (Blank rows = not tested)    LOWER EXTREMITY ROM:  Active ROM Right eval Left eval Right 10/23/23  Knee flexion 90 130 103  Knee extension Lacking 15 0 Lacking 8   (Blank rows = not tested)  LOWER EXTREMITY MMT:  MMT Right eval Left eval  Hip flexion 3 5  Hip extension    Hip abduction    Hip adduction    Knee flexion 3+ 5  Knee extension 3+ 5  Ankle dorsiflexion 5 5  Ankle plantarflexion     (Blank rows = not tested)  LUMBAR SPECIAL TESTS:  Slump test: Negative   FUNCTIONAL TESTS:  5 times sit to stand: deferred  GAIT: Distance walked: 50' Assistive device utilized: Single point cane Level of assistance: Modified independence Comments: slightly antalgic, slightly decreased speed.     TODAY'S TREATMENT:                                                                                                                              DATE:  11/01/23:therapeutic exercise and manual techniques: utilized throughout session to improve soft tissue extensibility, Rom and mechanics for R LE  Walking in hallway 2 lengths, 300' Prone on mat table for PA glides R hip to improve ant capsular restrictions, gait mechanics, also R hip distraction x 3 In prone for contract /relax for R knee flexion stretch, 10 reps, 5 sec isometric resistance  quads  Supine for R hip flexion stretch, pt reported relief of R sciatic pain with this stretch Supine A/P glides R tibia with emphasis on tibial adduction 3 bouts 15 sec holds Seated for long arc quads R, therapist providing terminal R knee extension overpressure and pt isolating quads for eccentric lowering Standing for B heel  drops/ plantarflexor stretch from 2" step, with B heel raises 15 reps, 5 sec holds for stretch Post wall leans with heel rocks , emphasis on R knee terminal extension.  Measured R knee ext -4, flexion 110 Sidelying L with pillow between knees for theragun R gluteus medius   10/30/23 Therapeutic Exercise: to improve strength and mobility.  Demo, verbal and tactile cues throughout for technique. Bike L1x14min Lateral step ups 4' 2x10 Fwd step ups 4' x 12 Runner stretch with therapist blocking tibia for extension  Stiff leg deadlift x 10 1hand support Seated R hamstring stretch leg propped on table 2x30"  Manual Therapy: Supine R tibiofemoral AP mobs  and passive knee extension  Performed by Jena Gauss, PT STM/TPR to R glutes, piriformis, skilled palpation and monitoring during dry needling. Trigger Point Dry-Needling  Treatment instructions: Expect mild to moderate muscle soreness. S/S of pneumothorax if dry needled over a lung field, and to seek immediate medical attention should they occur. Patient verbalized understanding of these instructions and education. Patient Consent Given: Yes Education handout provided: No Muscles treated: R glute med, R piriformis Electrical stimulation performed: No Parameters: N/A Treatment response/outcome: Twitch Response Elicited and Palpable Increase in Muscle Length  10/25/23 TherEx  Nustep L5 x8 minutes BLEs  Knee flexion stretch 15x5 seconds holds 8 inch box  HS stretches 3x30 seconds R LE Seated knee flexion stretch 15x5 second holds from chair  Forward step ups 4 inch box x12 Lateral step ups 4 inch box  x12 HS curls with thigh perpendicular to the ground 15x5 seconds   Manual  IASTM green ball R glutes/piriformis and hams prone, progressed to percussion gun x10 min    10/23/23 Therapeutic Exercise: to improve strength and mobility.  Demo, verbal and tactile cues throughout for technique. Nustep L5 x 6 min  Prone knee bends RLE x 10 Contract relax to quads with therapist progressively bending knee with rotational movements to help relax glutes to promote knee flexion.  Prone hip extension 2 x 5 bil  Standing toe raises x10 Standing hamstring curls x 5 r/l Standing hip extension 2 x 10 r/l Standing hip abduction x 10 r/l Sit to stands with feet even x 10 Church pews x 15 In prone - contract relax to quads with therapist progressively bending knee with rotational movements to help relax glutes to promote knee flexion.  Manual Therapy: to decrease muscle spasm and pain and improve mobility STM/TPR to R glutes and piriformis, skilled palpation and monitoring during dry needling. Trigger Point Dry-Needling  Treatment instructions: Expect mild to moderate muscle soreness. S/S of pneumothorax if dry needled over a lung field, and to seek immediate medical attention should they occur. Patient verbalized understanding of these instructions and education. Patient Consent Given: Yes Education handout provided: Previously provided Muscles treated: R glut med/max, R piriformis Electrical stimulation performed: No Parameters: N/A Treatment response/outcome: Twitch Response Elicited and Palpable Increase in Muscle Length    PATIENT EDUCATION:  Education details: HEP update Person educated: Patient Education method: Explanation, Demonstration, Verbal cues, and Handouts Education comprehension: verbalized understanding and returned demonstration  HOME EXERCISE PROGRAM: Access Code: [email or web address] URL: [email or web address] Date: 10/23/2023 Prepared by: Harrie Foreman  Exercises - Supine Quad Set  - 2 x daily - 7 x weekly - 2 sets - 10 reps - 5-10 sec hold - Supine Short Arc Quad  - 2 x daily - 7 x weekly - 2 sets - 10 reps - Straight Leg Raise  - 2  x daily - 7 x weekly - 2 sets - 10 reps - Supine Heel Slide with Strap  - 2 x daily - 7 x weekly - 2 sets - 10 reps - Mini Squat with Counter Support  - 2 x daily - 7 x weekly - 2 sets - 10 reps - Sit to Stand without Arm Support  - 2 x daily - 7 x weekly - 2 sets - 10 reps - Seated Knee Flexion Stretch  - 2 x daily - 7 x weekly - 2 sets - 10 reps - Seated Hamstring Stretch  - 2 x daily - 7 x weekly - 3 sets - 10 reps - Standing Hip Abduction with Counter Support  - 2 x daily - 7 x weekly - 3 sets - 10 reps - Heel Raises with Counter Support  - 2 x daily - 7 x weekly - 3 sets - 10 reps - Standing Hip Extension with Counter Support  - 2 x daily - 7 x weekly - 3 sets - 10 reps - Gastroc Stretch on Wall  - 2 x daily - 7 x weekly - 3 sets - 10 reps - Seated Knee Extension Stretch with Chair  - 1 x daily - 7 x weekly - 1 sets - 1 reps - 5 min hold - Standing Knee Flexion  - 1 x daily - 7 x weekly - 2-3 sets - 10 reps - [place]  - 1 x daily - 7 x weekly - 2-3 sets - 10 reps  ASSESSMENT:  CLINICAL IMPRESSION: Pt continuing  to improve following R TKA, now greater than 4 weeks post op.  She has excellent flexion ROM and quads strength. Focused more on R terminal knee extension control today also continuing to suffer from R sciatica, able to get relief with R hip flexion stretch today.  Still has some habitual limp with her gait. [place] continues to demonstrate potential for improvement and would benefit from continued skilled therapy to address impairments.     OBJECTIVE IMPAIRMENTS: Abnormal gait, decreased activity tolerance, decreased balance, decreased endurance, decreased mobility, difficulty walking, decreased ROM, decreased strength, increased edema, increased fascial restrictions,  increased muscle spasms, impaired flexibility, and pain.   ACTIVITY LIMITATIONS: carrying, lifting, bending, sitting, standing, squatting, sleeping, stairs, transfers, locomotion level, and caring for others   PARTICIPATION LIMITATIONS: meal prep, cleaning, laundry, driving, shopping, community activity, yard work, and leisure activities  PERSONAL FACTORS: Time since onset of injury/illness/exacerbation and 1-2 comorbidities: low back pain, history of L TKA, R ankle pain, HTN  are also affecting patient's functional outcome.   REHAB POTENTIAL: Excellent  CLINICAL DECISION MAKING: Evolving/moderate complexity  EVALUATION COMPLEXITY: Moderate   GOALS: Goals reviewed with patient? Yes   SHORT TERM GOALS: Target date: 11/07/2023   Independent with initial HEP. Baseline:  Goal status: MET 10/23/23 good compliance, independent.   2.  Patient will report 50% improvement in LBP/sciatica symptoms.  Baseline: 4/10 pain, interrupts sleep Goal status: IN PROGRESS 10/23/23 30-40% improvement   LONG TERM GOALS: Target date: 11/28/2023   Independent with advanced/ongoing HEP to improve outcomes and carryover.  Baseline:  Goal status: IN PROGRESS  2.  [place] will demonstrate right knee flexion to 120 deg to ascend/descend stairs. Baseline: 90 Goal status: IN PROGRESS 10/23/23 103  3.  [place] will demonstrate full right knee extension for safety with gait. Baseline: lacking 15 Goal status: IN PROGRESS 10/23/23 lacking 8  4.  [place]  [place] will be able to ambulate 600' safely without and normal gait pattern to access community.  Baseline: using SPC Goal status: IN PROGRESS  5.  R[place] will be able to ascend/descend stairs with 1 HR and reciprocal step pattern safely to access home and community.  Baseline: step to gait Goal status: IN PROGRESS  6.  [place] will demonstrate > 20/30 on FGA to demonstrate decreased risk of falls.   Baseline:  NT Goal status: IN PROGRESS  7.  [place] will demonstrate improved functional LE strength by completing 5x STS in <14 seconds.  Baseline: NT Goal status: IN PROGRESS  8.  [place] will report < 18/45 on Modified Oswestry to demonstrate improved quality of life.  Baseline: 24/45 on Modified Oswestry Goal status: IN PROGRESS  9.  [place] will report 75% improvement in LBP/sciatica symptoms.  Baseline:  Goal status: IN PROGRESS    PLAN:  PT FREQUENCY: 2-3x/week  PT DURATION: 6 weeks  PLANNED INTERVENTIONS: 97164- PT Re-evaluation, 97110-Therapeutic exercises, 97530- Therapeutic activity, 97112- Neuromuscular re-education, 97535- Self Care, 16109- Manual therapy, [id number]- Gait training, [phone]- Orthotic Fit/training, 97014- Electrical stimulation (unattended), [id number]- Electrical stimulation (manual), [id number]- Vasopneumatic device, [id number]- Ultrasound, [id number]- Traction (mechanical), Patient/Family education, Balance training, Stair training, Taping, Dry Needling, Joint mobilization, Joint manipulation, Spinal manipulation, Spinal mobilization, Scar mobilization, Cryotherapy, and Moist heat  PLAN FOR NEXT SESSION: review and progress HEP for knee, progress LE strength, balance and ROM.  Measure ROM weekly. Focus on extension.  Modalities PRN.    Zakariyah Freimark Frazier Richards, PT, DPT, OCS 11/01/2023 10:42 AM

## 2023-11-05 ENCOUNTER — Ambulatory Visit: Payer: Medicare Other

## 2023-11-05 DIAGNOSIS — M25661 Stiffness of right knee, not elsewhere classified: Secondary | ICD-10-CM

## 2023-11-05 DIAGNOSIS — M25561 Pain in right knee: Secondary | ICD-10-CM | POA: Diagnosis not present

## 2023-11-05 DIAGNOSIS — M6281 Muscle weakness (generalized): Secondary | ICD-10-CM

## 2023-11-05 DIAGNOSIS — M5441 Lumbago with sciatica, right side: Secondary | ICD-10-CM | POA: Diagnosis not present

## 2023-11-05 DIAGNOSIS — M5432 Sciatica, left side: Secondary | ICD-10-CM | POA: Diagnosis not present

## 2023-11-05 DIAGNOSIS — R600 Localized edema: Secondary | ICD-10-CM

## 2023-11-05 DIAGNOSIS — M5431 Sciatica, right side: Secondary | ICD-10-CM | POA: Diagnosis not present

## 2023-11-05 NOTE — Therapy (Signed)
OUTPATIENT PHYSICAL THERAPY LOWER EXTREMITY TREATMENT   Patient Name: Kristin Benjamin MRN: [id number] DOB:1956/01/28, 67 y.o., female Today's Date: 11/05/2023  END OF SESSION:  PT End of Session - 11/05/23 1443     Visit Number 7    Date for PT Re-Evaluation 11/28/23    Authorization Type [employer]    Progress Note Due on Visit 10    PT Start Time 1404    PT Stop Time 1443    PT Time Calculation (min) 39 min    Activity Tolerance Patient tolerated treatment well    Behavior During Therapy [facility] for tasks assessed/performed                 Past Medical History:  Diagnosis Date   Allergy    Anxiety    Arthritis    Depression    Headache    history of   Heart murmur    in the past   Hx of adenomatous colonic polyps 07/17/2017   Hyperlipidemia    Hypertension    Past Surgical History:  Procedure Laterality Date   bladder tack     COLONOSCOPY  2006   and 2018   FUNCTIONAL ENDOSCOPIC SINUS SURGERY     JOINT REPLACEMENT     Left total knee arthroplasty Dr. Magnus Ivan 10-18-18   KNEE ARTHROSCOPY W/ MENISCAL REPAIR Right 08/2016   TOTAL KNEE ARTHROPLASTY Left 10/18/2018   Procedure: LEFT TOTAL KNEE ARTHROPLASTY;  Surgeon: Kathryne Hitch, MD;  Location: [facility];  Service: Orthopedics;  Laterality: Left;   TOTAL KNEE ARTHROPLASTY Right 09/28/2023   Procedure: RIGHT TOTAL KNEE ARTHROPLASTY;  Surgeon: Kathryne Hitch, MD;  Location: [facility];  Service: Orthopedics;  Laterality: Right;   TUBAL LIGATION  1993   Patient Active Problem List   Diagnosis Date Noted   Status post total right knee replacement 09/28/2023   Frequent UTI 06/25/2023   Coronary artery disease involving native coronary artery of native heart without angina pectoris 05/21/2023   Aortic atherosclerosis (HCC) 05/21/2023   Acute cystitis without hematuria 04/17/2023   High risk medication use 12/18/2022   Attention deficit hyperactivity disorder (ADHD) 05/18/2022   Mixed  hyperlipidemia 04/27/2022   Primary hypertension 04/27/2022   Anxiety and depression 04/27/2022   Pes anserinus bursitis of right knee 02/16/2020   Status post total left knee replacement 10/18/2018   Chronic pain of right knee 03/28/2018   Hx of adenomatous colonic polyps 07/17/2017    PCP: Clayborne Dana, NP   REFERRING PROVIDER: Kathryne Hitch*   REFERRING DIAG: X91.47,W29.56 (ICD-10-CM) - Bilateral sciatica   THERAPY DIAG:  Acute pain of right knee  Stiffness of right knee, not elsewhere classified  Localized edema  Muscle weakness (generalized)  Acute right-sided low back pain with right-sided sciatica  Rationale for Evaluation and Treatment: Rehabilitation  ONSET DATE: s/p R TKR on 09/28/2023  SUBJECTIVE:   SUBJECTIVE STATEMENT:  Pt denies pain today.  NEXT MD VISIT: 11/14/2022 with Dr. Magnus Ivan  PERTINENT HISTORY: L TKA 2019, R ankle pain, HTN, low back pain PAIN:  Are you having pain? Yes: NPRS scale: 0/10 Pain location: R knee Pain description: ache Aggravating factors: bending Relieving factors: ice  Are you having pain? Yes: NPRS scale: 2/10 Pain location: Low back Pain description: toothache, stiff  Aggravating factors: bending knee, overuse  Relieving factors: ice, movement   PRECAUTIONS: None  RED FLAGS: None   WEIGHT BEARING RESTRICTIONS: No  FALLS:  Has patient fallen in last 6  months? No  LIVING ENVIRONMENT: Lives with: lives with their spouse Lives in: House/apartment Stairs: Yes: Internal: 14 steps; on left going up and External: 2 steps; none Has following equipment at home: Single point cane and Walker - 2 wheeled  OCCUPATION: retired  PLOF: Independent and Leisure: pickleball, swim, walk  PATIENT GOALS: get back to walking, pickleball, and activities   OBJECTIVE:  Note: Objective measures were completed at Evaluation unless otherwise noted.  DIAGNOSTIC FINDINGS: DG R knee 09/28/23 IMPRESSION: Well-positioned  right total knee arthroplasty.  No imaging of low back  PATIENT SURVEYS:  Modified Oswestry 24/45   COGNITION: Overall cognitive status: Within functional limits for tasks assessed     SENSATION: WFL  EDEMA:  Circumferential: 45cm R, 41.5 cm L knee  MUSCLE LENGTH: NT  POSTURE: No Significant postural limitations  PALPATION: Tenderness R glutes/piriformis. Incision covered with steristrips.  No tenderness in calf.  No signs of infection.   LUMBAR ROM:   NT today due to R knee pain and poor tolerance to standing.   Active  A/PROM  eval  Flexion   Extension   Right lateral flexion   Left lateral flexion   Right rotation   Left rotation    (Blank rows = not tested)    LOWER EXTREMITY ROM:  Active ROM Right eval Left eval Right 10/23/23  Knee flexion 90 130 103  Knee extension Lacking 15 0 Lacking 8   (Blank rows = not tested)  LOWER EXTREMITY MMT:  MMT Right eval Left eval  Hip flexion 3 5  Hip extension    Hip abduction    Hip adduction    Knee flexion 3+ 5  Knee extension 3+ 5  Ankle dorsiflexion 5 5  Ankle plantarflexion     (Blank rows = not tested)  LUMBAR SPECIAL TESTS:  Slump test: Negative   FUNCTIONAL TESTS:  5 times sit to stand: deferred  GAIT: Distance walked: 50' Assistive device utilized: Single point cane Level of assistance: Modified independence Comments: slightly antalgic, slightly decreased speed.     TODAY'S TREATMENT:                                                                                                                              DATE:  11/05/23 Therapeutic Exercise: to improve strength and mobility.  Demo, verbal and tactile cues throughout for technique. Bike L1x21min Fwd step ups x 15 6' Lateral step ups x 15 6' Runner stretch 3x15" manual blocking of tibia Gastroc step stretch 3x15" manual blocking of tibia Prone hang reviewed R SLR 1# 2x10 Seated R LAQ 1# 2x10 HS curls RTB x 10   Manual  Therapy: Supine R tibiofemoral AP mobs  and passive knee extension  11/01/23:therapeutic exercise and manual techniques: utilized throughout session to improve soft tissue extensibility, Rom and mechanics for R LE  Walking in hallway 2 lengths, 300' Prone on mat table for PA glides R hip to improve ant  capsular restrictions, gait mechanics, also R hip distraction x 3 In prone for contract /relax for R knee flexion stretch, 10 reps, 5 sec isometric resistance quads  Supine for R hip flexion stretch, pt reported relief of R sciatic pain with this stretch Supine A/P glides R tibia with emphasis on tibial adduction 3 bouts 15 sec holds Seated for long arc quads R, therapist providing terminal R knee extension overpressure and pt isolating quads for eccentric lowering Standing for B heel drops/ plantarflexor stretch from 2" step, with B heel raises 15 reps, 5 sec holds for stretch Post wall leans with heel rocks , emphasis on R knee terminal extension.  Measured R knee ext -4, flexion 110 Sidelying L with pillow between knees for theragun R gluteus medius   10/30/23 Therapeutic Exercise: to improve strength and mobility.  Demo, verbal and tactile cues throughout for technique. Bike L1x79min Lateral step ups 4' 2x10 Fwd step ups 4' x 12 Runner stretch with therapist blocking tibia for extension  Stiff leg deadlift x 10 1hand support Seated R hamstring stretch leg propped on table 2x30"  Manual Therapy: Supine R tibiofemoral AP mobs  and passive knee extension  Performed by Jena Gauss, PT STM/TPR to R glutes, piriformis, skilled palpation and monitoring during dry needling. Trigger Point Dry-Needling  Treatment instructions: Expect mild to moderate muscle soreness. S/S of pneumothorax if dry needled over a lung field, and to seek immediate medical attention should they occur. Patient verbalized understanding of these instructions and education. Patient Consent Given: Yes Education  handout provided: No Muscles treated: R glute med, R piriformis Electrical stimulation performed: No Parameters: N/A Treatment response/outcome: Twitch Response Elicited and Palpable Increase in Muscle Length  10/25/23 TherEx  Nustep L5 x8 minutes BLEs  Knee flexion stretch 15x5 seconds holds 8 inch box  HS stretches 3x30 seconds R LE Seated knee flexion stretch 15x5 second holds from chair  Forward step ups 4 inch box x12 Lateral step ups 4 inch box x12 HS curls with thigh perpendicular to the ground 15x5 seconds   Manual  IASTM green ball R glutes/piriformis and hams prone, progressed to percussion gun x10 min    10/23/23 Therapeutic Exercise: to improve strength and mobility.  Demo, verbal and tactile cues throughout for technique. Nustep L5 x 6 min  Prone knee bends RLE x 10 Contract relax to quads with therapist progressively bending knee with rotational movements to help relax glutes to promote knee flexion.  Prone hip extension 2 x 5 bil  Standing toe raises x10 Standing hamstring curls x 5 r/l Standing hip extension 2 x 10 r/l Standing hip abduction x 10 r/l Sit to stands with feet even x 10 Church pews x 15 In prone - contract relax to quads with therapist progressively bending knee with rotational movements to help relax glutes to promote knee flexion.  Manual Therapy: to decrease muscle spasm and pain and improve mobility STM/TPR to R glutes and piriformis, skilled palpation and monitoring during dry needling. Trigger Point Dry-Needling  Treatment instructions: Expect mild to moderate muscle soreness. S/S of pneumothorax if dry needled over a lung field, and to seek immediate medical attention should they occur. Patient verbalized understanding of these instructions and education. Patient Consent Given: Yes Education handout provided: Previously provided Muscles treated: R glut med/max, R piriformis Electrical stimulation performed: No Parameters: N/A Treatment  response/outcome: Twitch Response Elicited and Palpable Increase in Muscle Length    PATIENT EDUCATION:  Education details: HEP update Person educated:  Patient Education method: Explanation, Demonstration, Verbal cues, and Handouts Education comprehension: verbalized understanding and returned demonstration  HOME EXERCISE PROGRAM: Access Code: [email or web address] URL: [email or web address] Date: 10/23/2023 Prepared by: Harrie Foreman  Exercises - Supine Quad Set  - 2 x daily - 7 x weekly - 2 sets - 10 reps - 5-10 sec hold - Supine Short Arc Quad  - 2 x daily - 7 x weekly - 2 sets - 10 reps - Straight Leg Raise  - 2 x daily - 7 x weekly - 2 sets - 10 reps - Supine Heel Slide with Strap  - 2 x daily - 7 x weekly - 2 sets - 10 reps - Mini Squat with Counter Support  - 2 x daily - 7 x weekly - 2 sets - 10 reps - Sit to Stand without Arm Support  - 2 x daily - 7 x weekly - 2 sets - 10 reps - Seated Knee Flexion Stretch  - 2 x daily - 7 x weekly - 2 sets - 10 reps - Seated Hamstring Stretch  - 2 x daily - 7 x weekly - 3 sets - 10 reps - Standing Hip Abduction with Counter Support  - 2 x daily - 7 x weekly - 3 sets - 10 reps - Heel Raises with Counter Support  - 2 x daily - 7 x weekly - 3 sets - 10 reps - Standing Hip Extension with Counter Support  - 2 x daily - 7 x weekly - 3 sets - 10 reps - Gastroc Stretch on Wall  - 2 x daily - 7 x weekly - 3 sets - 10 reps - Seated Knee Extension Stretch with Chair  - 1 x daily - 7 x weekly - 1 sets - 1 reps - 5 min hold - Standing Knee Flexion  - 1 x daily - 7 x weekly - 2-3 sets - 10 reps - Church Pew  - 1 x daily - 7 x weekly - 2-3 sets - 10 reps  ASSESSMENT:  CLINICAL IMPRESSION: Continued with progression of exercises for strength and mobility of R knee. Added prone hang for HEP to achieve TKE. Able to progress step height with step ups. No flare up of R LBP today. Extension still lacking 4 deg. [place] Class continues to demonstrate  potential for improvement and would benefit from continued skilled therapy to address impairments.     OBJECTIVE IMPAIRMENTS: Abnormal gait, decreased activity tolerance, decreased balance, decreased endurance, decreased mobility, difficulty walking, decreased ROM, decreased strength, increased edema, increased fascial restrictions, increased muscle spasms, impaired flexibility, and pain.   ACTIVITY LIMITATIONS: carrying, lifting, bending, sitting, standing, squatting, sleeping, stairs, transfers, locomotion level, and caring for others   PARTICIPATION LIMITATIONS: meal prep, cleaning, laundry, driving, shopping, community activity, yard work, and leisure activities  PERSONAL FACTORS: Time since onset of injury/illness/exacerbation and 1-2 comorbidities: low back pain, history of L TKA, R ankle pain, HTN  are also affecting patient's functional outcome.   REHAB POTENTIAL: Excellent  CLINICAL DECISION MAKING: Evolving/moderate complexity  EVALUATION COMPLEXITY: Moderate   GOALS: Goals reviewed with patient? Yes   SHORT TERM GOALS: Target date: 11/07/2023   Independent with initial HEP. Baseline:  Goal status: MET 10/23/23 good compliance, independent.   2.  Patient will report 50% improvement in LBP/sciatica symptoms.  Baseline: 4/10 pain, interrupts sleep Goal status: IN PROGRESS 10/23/23 30-40% improvement   LONG TERM GOALS: Target date: 11/28/2023   Independent with advanced/ongoing HEP to improve outcomes and  carryover.  Baseline:  Goal status: IN PROGRESS  2.  R[place] Yapp will demonstrate right knee flexion to 120 deg to ascend/descend stairs. Baseline: 90 Goal status: IN PROGRESS 10/23/23 103  3.  Rivka Spring [place] will demonstrate full right knee extension for safety with gait. Baseline: lacking 15 Goal status: IN PROGRESS 10/23/23 lacking 8  4.  Rivka Spring Burgeson will be able to ambulate 600' safely without and normal gait pattern to access community.   Baseline: using SPC Goal status: IN PROGRESS  5.  Rivka Spring [place] will be able to ascend/descend stairs with 1 HR and reciprocal step pattern safely to access home and community.  Baseline: step to gait Goal status: IN PROGRESS  6.  Rivka Spring Macdonnell will demonstrate > 20/30 on FGA to demonstrate decreased risk of falls.   Baseline: NT Goal status: IN PROGRESS  7.  Rivka Spring Verdi will demonstrate improved functional LE strength by completing 5x STS in <14 seconds.  Baseline: NT Goal status: IN PROGRESS  8.  Rivka [place] Clock will report < 18/45 on Modified Oswestry to demonstrate improved quality of life.  Baseline: 24/45 on Modified Oswestry Goal status: IN PROGRESS  9.  Rivka Spring Hewes will report 75% improvement in LBP/sciatica symptoms.  Baseline:  Goal status: IN PROGRESS    PLAN:  PT FREQUENCY: 2-3x/week  PT DURATION: 6 weeks  PLANNED INTERVENTIONS: 97164- PT Re-evaluation, 97110-Therapeutic exercises, 97530- Therapeutic activity, 97112- Neuromuscular re-education, 97535- Self Care, [id number]- Manual therapy, [id number]- Gait training, [id number]- Orthotic Fit/training, 97014- Electrical stimulation (unattended), [id number]- Electrical stimulation (manual), [id number]- Vasopneumatic device, [id number]- Ultrasound, [id number]- Traction (mechanical), Patient/Family education, Balance training, Stair training, Taping, Dry Needling, Joint mobilization, Joint manipulation, Spinal manipulation, Spinal mobilization, Scar mobilization, Cryotherapy, and Moist heat  PLAN FOR NEXT SESSION: review and progress HEP for knee, progress LE strength, balance and ROM.  Measure ROM weekly. Focus on extension.  Modalities PRN.    Darleene Cleaver, PTA 11/05/2023 2:43 PM

## 2023-11-08 ENCOUNTER — Ambulatory Visit: Payer: Medicare Other | Admitting: Physical Therapy

## 2023-11-08 ENCOUNTER — Encounter: Payer: Self-pay | Admitting: Physical Therapy

## 2023-11-08 DIAGNOSIS — M5441 Lumbago with sciatica, right side: Secondary | ICD-10-CM | POA: Diagnosis not present

## 2023-11-08 DIAGNOSIS — M5432 Sciatica, left side: Secondary | ICD-10-CM | POA: Diagnosis not present

## 2023-11-08 DIAGNOSIS — M25561 Pain in right knee: Secondary | ICD-10-CM

## 2023-11-08 DIAGNOSIS — M6281 Muscle weakness (generalized): Secondary | ICD-10-CM | POA: Diagnosis not present

## 2023-11-08 DIAGNOSIS — M5431 Sciatica, right side: Secondary | ICD-10-CM | POA: Diagnosis not present

## 2023-11-08 DIAGNOSIS — R600 Localized edema: Secondary | ICD-10-CM | POA: Diagnosis not present

## 2023-11-08 DIAGNOSIS — M25661 Stiffness of right knee, not elsewhere classified: Secondary | ICD-10-CM

## 2023-11-08 NOTE — Therapy (Signed)
OUTPATIENT PHYSICAL THERAPY LOWER EXTREMITY TREATMENT   Patient Name: Kristin Benjamin MRN: [id number] DOB:[birth year], 67 y.o., female Today's Date: 11/08/2023  END OF SESSION:  PT End of Session - 11/08/23 0933     Visit Number 8    Date for PT Re-Evaluation 11/28/23    Authorization Type [employer]    Progress Note Due on Visit 10    PT Start Time 0931    PT Stop Time 1015    PT Time Calculation (min) 44 min    Activity Tolerance Patient tolerated treatment well    Behavior During Therapy [facility] for tasks assessed/performed                 Past Medical History:  Diagnosis Date   Allergy    Anxiety    Arthritis    Depression    Headache    history of   Heart murmur    in the past   Hx of adenomatous colonic polyps 07/17/2017   Hyperlipidemia    Hypertension    Past Surgical History:  Procedure Laterality Date   bladder tack     COLONOSCOPY  2006   and 2018   FUNCTIONAL ENDOSCOPIC SINUS SURGERY     JOINT REPLACEMENT     Left total knee arthroplasty Dr. Magnus Ivan 10-18-18   KNEE ARTHROSCOPY W/ MENISCAL REPAIR Right 08/2016   TOTAL KNEE ARTHROPLASTY Left 10/18/2018   Procedure: LEFT TOTAL KNEE ARTHROPLASTY;  Surgeon: Kathryne Hitch, MD;  Location: [facility];  Service: Orthopedics;  Laterality: Left;   TOTAL KNEE ARTHROPLASTY Right 09/28/2023   Procedure: RIGHT TOTAL KNEE ARTHROPLASTY;  Surgeon: Kathryne Hitch, MD;  Location: [facility];  Service: Orthopedics;  Laterality: Right;   TUBAL LIGATION  1993   Patient Active Problem List   Diagnosis Date Noted   Status post total right knee replacement 09/28/2023   Frequent UTI 06/25/2023   Coronary artery disease involving native coronary artery of native heart without angina pectoris 05/21/2023   Aortic atherosclerosis (HCC) 05/21/2023   Acute cystitis without hematuria 04/17/2023   High risk medication use 12/18/2022   Attention deficit hyperactivity disorder (ADHD) 05/18/2022   Mixed  hyperlipidemia 04/27/2022   Primary hypertension 04/27/2022   Anxiety and depression 04/27/2022   Pes anserinus bursitis of right knee 02/16/2020   Status post total left knee replacement 10/18/2018   Chronic pain of right knee 03/28/2018   Hx of adenomatous colonic polyps 07/17/2017    PCP: Clayborne Dana, NP   REFERRING PROVIDER: Kathryne Hitch*   REFERRING DIAG: E33.29,J18.84 (ICD-10-CM) - Bilateral sciatica   THERAPY DIAG:  Acute pain of right knee  Stiffness of right knee, not elsewhere classified  Localized edema  Muscle weakness (generalized)  Acute right-sided low back pain with right-sided sciatica  Rationale for Evaluation and Treatment: Rehabilitation  ONSET DATE: s/p R TKR on 09/28/2023  SUBJECTIVE:   SUBJECTIVE STATEMENT: No pain, but incision is tender, put bandaid over top because clothes irritate.  Otherwise doing well, but iceman away.  NEXT MD VISIT: 11/14/2022 with Dr. Magnus Ivan  PERTINENT HISTORY: L TKA 2019, R ankle pain, HTN, low back pain PAIN:  Are you having pain? Yes: NPRS scale: 0/10 Pain location: R knee Pain description: ache Aggravating factors: bending Relieving factors: ice  Are you having pain? Yes: NPRS scale: 2/10 Pain location: Low back Pain description: toothache, stiff  Aggravating factors: bending knee, overuse  Relieving factors: ice, movement   PRECAUTIONS: None  RED FLAGS: None  WEIGHT BEARING RESTRICTIONS: No  FALLS:  Has patient fallen in last 6 months? No  LIVING ENVIRONMENT: Lives with: lives with their spouse Lives in: House/apartment Stairs: Yes: Internal: 14 steps; on left going up and External: 2 steps; none Has following equipment at home: Single point cane and Walker - 2 wheeled  OCCUPATION: retired  PLOF: Independent and Leisure: pickleball, swim, walk  PATIENT GOALS: get back to walking, pickleball, and activities   OBJECTIVE:  Note: Objective measures were completed at Evaluation  unless otherwise noted.  DIAGNOSTIC FINDINGS: DG R knee 09/28/23 IMPRESSION: Well-positioned right total knee arthroplasty.  No imaging of low back  PATIENT SURVEYS:  Modified Oswestry 24/45   COGNITION: Overall cognitive status: Within functional limits for tasks assessed     SENSATION: WFL  EDEMA:  Circumferential: 45cm R, 41.5 cm L knee  MUSCLE LENGTH: NT  POSTURE: No Significant postural limitations  PALPATION: Tenderness R glutes/piriformis. Incision covered with steristrips.  No tenderness in calf.  No signs of infection.   LUMBAR ROM:   NT today due to R knee pain and poor tolerance to standing.   Active  A/PROM  eval  Flexion   Extension   Right lateral flexion   Left lateral flexion   Right rotation   Left rotation    (Blank rows = not tested)    LOWER EXTREMITY ROM:  Active ROM Right eval Left eval Right 10/23/23 Right 11/08/23  Knee flexion 90 130 103 110  Knee extension Lacking 15 0 Lacking 8 Lacking 4    (Blank rows = not tested)  LOWER EXTREMITY MMT:  MMT Right eval Left eval  Hip flexion 3 5  Hip extension    Hip abduction    Hip adduction    Knee flexion 3+ 5  Knee extension 3+ 5  Ankle dorsiflexion 5 5  Ankle plantarflexion     (Blank rows = not tested)  LUMBAR SPECIAL TESTS:  Slump test: Negative   FUNCTIONAL TESTS:  5 times sit to stand: deferred  GAIT: Distance walked: 50' Assistive device utilized: Single point cane Level of assistance: Modified independence Comments: slightly antalgic, slightly decreased speed.     TODAY'S TREATMENT:                                                                                                                              DATE:   11/08/23 Therapeutic Exercise: to improve strength and mobility.  Demo, verbal and tactile cues throughout for technique. Bike L2 x 6 min full revolutions seat 7 Step ups (2 risers 2 x 12 RLE Side step ups (2 risers) 2 x 12 RLE Step dows (1 riser)  x 12 RLE in stance for eccentric strenthening Runners stretch with ER and extension applied by PT 5 x 10 sec holds TKE with towel behind knee x 15 Supine heel slide with strap x 10 Manual Therapy: to decrease muscle spasm and pain and improve mobility Scar tissue  mobilization - limited due to sensitivity Patellar mobs - limited due to sensitivity, noted significant edema still present Prone knee flexion stretch -with hip hyperextended to stretch rectus femoris, isometric quad stretch to fatigue and allow deeper stretch, to improve flexion, rotational movements to relax glutes R tib fib mobs  11/05/23 Therapeutic Exercise: to improve strength and mobility.  Demo, verbal and tactile cues throughout for technique. Bike L1x3min Fwd step ups x 15 6' Lateral step ups x 15 6' Runner stretch 3x15" manual blocking of tibia Gastroc step stretch 3x15" manual blocking of tibia Prone hang reviewed R SLR 1# 2x10 Seated R LAQ 1# 2x10 HS curls RTB x 10   Manual Therapy: Supine R tibiofemoral AP mobs  and passive knee extension  11/01/23:therapeutic exercise and manual techniques: utilized throughout session to improve soft tissue extensibility, Rom and mechanics for R LE  Walking in hallway 2 lengths, 300' Prone on mat table for PA glides R hip to improve ant capsular restrictions, gait mechanics, also R hip distraction x 3 In prone for contract /relax for R knee flexion stretch, 10 reps, 5 sec isometric resistance quads  Supine for R hip flexion stretch, pt reported relief of R sciatic pain with this stretch Supine A/P glides R tibia with emphasis on tibial adduction 3 bouts 15 sec holds Seated for long arc quads R, therapist providing terminal R knee extension overpressure and pt isolating quads for eccentric lowering Standing for B heel drops/ plantarflexor stretch from 2" step, with B heel raises 15 reps, 5 sec holds for stretch Post wall leans with heel rocks , emphasis on R knee terminal  extension.  Measured R knee ext -4, flexion 110 Sidelying L with pillow between knees for theragun R gluteus medius   PATIENT EDUCATION:  Education details: HEP update Person educated: Patient Education method: Explanation, Demonstration, Verbal cues, and Handouts Education comprehension: verbalized understanding and returned demonstration  HOME EXERCISE PROGRAM: Access Code: [email or web address] URL: [email or web address] Date: 11/08/2023 Prepared by: Harrie Foreman  Exercises - Supine Quad Set  - 2 x daily - 7 x weekly - 2 sets - 10 reps - 5-10 sec hold - Supine Short Arc Quad  - 2 x daily - 7 x weekly - 2 sets - 10 reps - Straight Leg Raise  - 2 x daily - 7 x weekly - 2 sets - 10 reps - Supine Heel Slide with Strap  - 2 x daily - 7 x weekly - 2 sets - 10 reps - Mini Squat with Counter Support  - 2 x daily - 7 x weekly - 2 sets - 10 reps - Sit to Stand without Arm Support  - 2 x daily - 7 x weekly - 2 sets - 10 reps - Seated Knee Flexion Stretch  - 2 x daily - 7 x weekly - 2 sets - 10 reps - Seated Hamstring Stretch  - 2 x daily - 7 x weekly - 3 sets - 10 reps - Standing Hip Abduction with Counter Support  - 2 x daily - 7 x weekly - 3 sets - 10 reps - Heel Raises with Counter Support  - 2 x daily - 7 x weekly - 3 sets - 10 reps - Standing Hip Extension with Counter Support  - 2 x daily - 7 x weekly - 3 sets - 10 reps - Gastroc Stretch on Wall  - 2 x daily - 7 x weekly - 3 sets - 10 reps - Seated Knee Extension Stretch with  Chair  - 1 x daily - 7 x weekly - 1 sets - 1 reps - 5 min hold - Standing Knee Flexion  - 1 x daily - 7 x weekly - 2-3 sets - 10 reps - [place]  - 1 x daily - 7 x weekly - 2-3 sets - 10 reps - Standing Terminal Knee Extension at [employer] with Ball  - 1 x daily - 7 x weekly - 3 sets - 10 reps   [email or web address] ASSESSMENT:  CLINICAL IMPRESSION: Kristin Benjamin is making good progress with ROM 4-110 deg.   She is now ambulating without AD, no knee pain and sciatic pain has resolved.  Continued to work on functional strengthening, noted eccentric quad weakness with step downs.  Discussed next session will start resistance machines so can start exercising again at sports center as well.   Very tender over scar, reflexively moving therapist's hands away from area, so given desensitization protocol today along with TKE exercise for update.  Kristin Benjamin continues to demonstrate potential for improvement and would benefit from continued skilled therapy to address impairments.     OBJECTIVE IMPAIRMENTS: Abnormal gait, decreased activity tolerance, decreased balance, decreased endurance, decreased mobility, difficulty walking, decreased ROM, decreased strength, increased edema, increased fascial restrictions, increased muscle spasms, impaired flexibility, and pain.   ACTIVITY LIMITATIONS: carrying, lifting, bending, sitting, standing, squatting, sleeping, stairs, transfers, locomotion level, and caring for others   PARTICIPATION LIMITATIONS: meal prep, cleaning, laundry, driving, shopping, community activity, yard work, and leisure activities  PERSONAL FACTORS: Time since onset of injury/illness/exacerbation and 1-2 comorbidities: low back pain, history of L TKA, R ankle pain, HTN  are also affecting patient's functional outcome.   REHAB POTENTIAL: Excellent  CLINICAL DECISION MAKING: Evolving/moderate complexity  EVALUATION COMPLEXITY: Moderate   GOALS: Goals reviewed with patient? Yes   SHORT TERM GOALS: Target date: 11/07/2023   Independent with initial HEP. Baseline:  Goal status: MET 10/23/23 good compliance, independent.   2.  Patient will report 50% improvement in LBP/sciatica symptoms.  Baseline: 4/10 pain, interrupts sleep Goal status: IN PROGRESS 10/23/23 30-40% improvement   LONG TERM GOALS: Target date: 11/28/2023   Independent with advanced/ongoing HEP to improve outcomes  and carryover.  Baseline:  Goal status: IN PROGRESS  2.  Kristin Benjamin will demonstrate right knee flexion to 120 deg to ascend/descend stairs. Baseline: 90 Goal status: IN PROGRESS 10/23/23 103  3.  Kristin Benjamin will demonstrate full right knee extension for safety with gait. Baseline: lacking 15 Goal status: IN PROGRESS 10/23/23 lacking 8  4.  Kristin Benjamin will be able to ambulate 600' safely without and normal gait pattern to access community.  Baseline: using SPC Goal status: IN PROGRESS  5.  Kristin Benjamin will be able to ascend/descend stairs with 1 HR and reciprocal step pattern safely to access home and community.  Baseline: step to gait Goal status: IN PROGRESS  6.  Kristin Benjamin will demonstrate > 20/30 on FGA to demonstrate decreased risk of falls.   Baseline: NT Goal status: IN PROGRESS  7.  Kristin Benjamin will demonstrate improved functional LE strength by completing 5x STS in <14 seconds.  Baseline: NT Goal status: IN PROGRESS  8.  Kristin Benjamin will report < 18/45 on Modified Oswestry to demonstrate improved quality of life.  Baseline: 24/45 on Modified Oswestry Goal status: IN PROGRESS  9.  Kristin Benjamin will report 75% improvement in LBP/sciatica symptoms.  Baseline:  Goal status: IN PROGRESS    PLAN:  PT FREQUENCY: 2-3x/week  PT DURATION: 6 weeks  PLANNED INTERVENTIONS: 97164- PT Re-evaluation, 97110-Therapeutic exercises, 97530- Therapeutic activity, 97112- Neuromuscular re-education, 97535- Self Care, 86578- Manual therapy, [id number]- Gait training, [phone]- Orthotic Fit/training, 97014- Electrical stimulation (unattended), [id number]- Electrical stimulation (manual), [id number]- Vasopneumatic device, [id number]- Ultrasound, [id number]- Traction (mechanical), Patient/Family education, Balance training, Stair training, Taping, Dry Needling, Joint mobilization, Joint manipulation, Spinal manipulation, Spinal mobilization, Scar mobilization, Cryotherapy, and  Moist heat  PLAN FOR NEXT SESSION: review and progress HEP for knee, progress LE strength, balance and ROM.  Measure ROM weekly. Start gym equipment, has membership at sports center.  Jena Gauss, PT 11/08/2023 10:34 AM

## 2023-11-12 ENCOUNTER — Encounter: Payer: Self-pay | Admitting: Physical Therapy

## 2023-11-12 ENCOUNTER — Ambulatory Visit: Payer: Medicare Other | Admitting: Physical Therapy

## 2023-11-12 DIAGNOSIS — M5432 Sciatica, left side: Secondary | ICD-10-CM | POA: Diagnosis not present

## 2023-11-12 DIAGNOSIS — M6281 Muscle weakness (generalized): Secondary | ICD-10-CM

## 2023-11-12 DIAGNOSIS — M25561 Pain in right knee: Secondary | ICD-10-CM | POA: Diagnosis not present

## 2023-11-12 DIAGNOSIS — M5441 Lumbago with sciatica, right side: Secondary | ICD-10-CM

## 2023-11-12 DIAGNOSIS — M25661 Stiffness of right knee, not elsewhere classified: Secondary | ICD-10-CM | POA: Diagnosis not present

## 2023-11-12 DIAGNOSIS — R600 Localized edema: Secondary | ICD-10-CM

## 2023-11-12 DIAGNOSIS — M5431 Sciatica, right side: Secondary | ICD-10-CM | POA: Diagnosis not present

## 2023-11-12 NOTE — Therapy (Signed)
OUTPATIENT PHYSICAL THERAPY LOWER EXTREMITY TREATMENT   Patient Name: ELAIJAH Benjamin MRN: [id number] DOB:[birth year], 67 y.o., female Today's Date: 11/12/2023  END OF SESSION:  PT End of Session - 11/12/23 0847     Visit Number 9    Date for PT Re-Evaluation 11/28/23    Authorization Type [employer]    Progress Note Due on Visit 10    PT Start Time [phone]    PT Stop Time 0930    PT Time Calculation (min) 43 min    Activity Tolerance Patient tolerated treatment well    Behavior During Therapy [facility] for tasks assessed/performed                 Past Medical History:  Diagnosis Date   Allergy    Anxiety    Arthritis    Depression    Headache    history of   Heart murmur    in the past   Hx of adenomatous colonic polyps 07/17/2017   Hyperlipidemia    Hypertension    Past Surgical History:  Procedure Laterality Date   bladder tack     COLONOSCOPY  2006   and 2018   FUNCTIONAL ENDOSCOPIC SINUS SURGERY     JOINT REPLACEMENT     Left total knee arthroplasty Dr. Magnus Ivan 10-18-18   KNEE ARTHROSCOPY W/ MENISCAL REPAIR Right 08/2016   TOTAL KNEE ARTHROPLASTY Left 10/18/2018   Procedure: LEFT TOTAL KNEE ARTHROPLASTY;  Surgeon: Kathryne Hitch, MD;  Location: [facility];  Service: Orthopedics;  Laterality: Left;   TOTAL KNEE ARTHROPLASTY Right 09/28/2023   Procedure: RIGHT TOTAL KNEE ARTHROPLASTY;  Surgeon: Kathryne Hitch, MD;  Location: [facility];  Service: Orthopedics;  Laterality: Right;   TUBAL LIGATION  1993   Patient Active Problem List   Diagnosis Date Noted   Status post total right knee replacement 09/28/2023   Frequent UTI 06/25/2023   Coronary artery disease involving native coronary artery of native heart without angina pectoris 05/21/2023   Aortic atherosclerosis (HCC) 05/21/2023   Acute cystitis without hematuria 04/17/2023   High risk medication use 12/18/2022   Attention deficit hyperactivity disorder (ADHD) 05/18/2022   Mixed  hyperlipidemia 04/27/2022   Primary hypertension 04/27/2022   Anxiety and depression 04/27/2022   Pes anserinus bursitis of right knee 02/16/2020   Status post total left knee replacement 10/18/2018   Chronic pain of right knee 03/28/2018   Hx of adenomatous colonic polyps 07/17/2017    PCP: Clayborne Dana, NP   REFERRING PROVIDER: Kathryne Hitch*   REFERRING DIAG: W10.27,O53.66 (ICD-10-CM) - Bilateral sciatica   THERAPY DIAG:  Acute pain of right knee  Stiffness of right knee, not elsewhere classified  Localized edema  Muscle weakness (generalized)  Acute right-sided low back pain with right-sided sciatica  Rationale for Evaluation and Treatment: Rehabilitation  ONSET DATE: s/p R TKR on 09/28/2023  SUBJECTIVE:   SUBJECTIVE STATEMENT: Sciatic has bothered her since last session, but intermittent, just has to change positions to take pressure off it.   Knee is great.   NEXT MD VISIT: 11/14/2022 with Dr. Magnus Ivan  PERTINENT HISTORY: L TKA 2019, R ankle pain, HTN, low back pain PAIN:  Are you having pain? Yes: NPRS scale: 0/10 Pain location: R knee Pain description: ache Aggravating factors: bending Relieving factors: ice  Are you having pain? Yes: NPRS scale: 4/10 Pain location: Low back Pain description: toothache, stiff  Aggravating factors: bending knee, overuse  Relieving factors: ice, movement   PRECAUTIONS:  None  RED FLAGS: None   WEIGHT BEARING RESTRICTIONS: No  FALLS:  Has patient fallen in last 6 months? No  LIVING ENVIRONMENT: Lives with: lives with their spouse Lives in: House/apartment Stairs: Yes: Internal: 14 steps; on left going up and External: 2 steps; none Has following equipment at home: Single point cane and Walker - 2 wheeled  OCCUPATION: retired  PLOF: Independent and Leisure: pickleball, swim, walk  PATIENT GOALS: get back to walking, pickleball, and activities   OBJECTIVE:  Note: Objective measures were  completed at Evaluation unless otherwise noted.  DIAGNOSTIC FINDINGS: DG R knee 09/28/23 IMPRESSION: Well-positioned right total knee arthroplasty.  No imaging of low back  PATIENT SURVEYS:  Modified Oswestry 24/45   COGNITION: Overall cognitive status: Within functional limits for tasks assessed     SENSATION: WFL  EDEMA:  Circumferential: 45cm R, 41.5 cm L knee  MUSCLE LENGTH: NT  POSTURE: No Significant postural limitations  PALPATION: Tenderness R glutes/piriformis. Incision covered with steristrips.  No tenderness in calf.  No signs of infection.   LUMBAR ROM:   NT today due to R knee pain and poor tolerance to standing.   Active  A/PROM  eval  Flexion   Extension   Right lateral flexion   Left lateral flexion   Right rotation   Left rotation    (Blank rows = not tested)    LOWER EXTREMITY ROM:  Active ROM Right eval Left eval Right 10/23/23 Right 11/08/23  Knee flexion 90 130 103 110  Knee extension Lacking 15 0 Lacking 8 Lacking 4    (Blank rows = not tested)  LOWER EXTREMITY MMT:  MMT Right eval Left eval  Hip flexion 3 5  Hip extension    Hip abduction    Hip adduction    Knee flexion 3+ 5  Knee extension 3+ 5  Ankle dorsiflexion 5 5  Ankle plantarflexion     (Blank rows = not tested)  LUMBAR SPECIAL TESTS:  Slump test: Negative   FUNCTIONAL TESTS:  5 times sit to stand: deferred  GAIT: Distance walked: 50' Assistive device utilized: Single point cane Level of assistance: Modified independence Comments: slightly antalgic, slightly decreased speed.     TODAY'S TREATMENT:                                                                                                                              DATE:  11/12/23 Therapeutic Exercise: to improve strength and mobility.  Demo, verbal and tactile cues throughout for technique. Bike L2 x 6 min  Knee flexion stretch x 10 Runners stretch with ER and extension applied by PT 5 x 10 sec  holds Knee extension 15# 1 x 10 BLE, 1 x 10 BLE concentric, RLE eccentric lower Knee flexion 15# 1 x 10 BLE, 1 x 10 BLE concentric, RLE eccentric lower Leg Press 20# 2 x 10, Toe press x 15 Leg stretch on bench   11/08/23 Therapeutic  Exercise: to improve strength and mobility.  Demo, verbal and tactile cues throughout for technique. Bike L2 x 6 min full revolutions seat 7 Step ups (2 risers 2 x 12 RLE Side step ups (2 risers) 2 x 12 RLE Step dows (1 riser) x 12 RLE in stance for eccentric strenthening Runners stretch with ER and extension applied by PT 5 x 10 sec holds TKE with towel behind knee x 15 Supine heel slide with strap x 10 Manual Therapy: to decrease muscle spasm and pain and improve mobility Scar tissue mobilization - limited due to sensitivity Patellar mobs - limited due to sensitivity, noted significant edema still present Prone knee flexion stretch -with hip hyperextended to stretch rectus femoris, isometric quad stretch to fatigue and allow deeper stretch, to improve flexion, rotational movements to relax glutes R tib fib mobs  11/05/23 Therapeutic Exercise: to improve strength and mobility.  Demo, verbal and tactile cues throughout for technique. Bike L1x36min Fwd step ups x 15 6' Lateral step ups x 15 6' Runner stretch 3x15" manual blocking of tibia Gastroc step stretch 3x15" manual blocking of tibia Prone hang reviewed R SLR 1# 2x10 Seated R LAQ 1# 2x10 HS curls RTB x 10   Manual Therapy: Supine R tibiofemoral AP mobs  and passive knee extension   PATIENT EDUCATION:  Education details: HEP review Person educated: Patient Education method: Programmer, multimedia, Facilities manager, Verbal cues, and Handouts Education comprehension: verbalized understanding and returned demonstration  HOME EXERCISE PROGRAM: Access Code: [email or web address] URL: [email or web address] Date: 11/08/2023 Prepared by: Harrie Foreman  Exercises - Supine Quad Set  - 2 x daily - 7 x  weekly - 2 sets - 10 reps - 5-10 sec hold - Supine Short Arc Quad  - 2 x daily - 7 x weekly - 2 sets - 10 reps - Straight Leg Raise  - 2 x daily - 7 x weekly - 2 sets - 10 reps - Supine Heel Slide with Strap  - 2 x daily - 7 x weekly - 2 sets - 10 reps - Mini Squat with Counter Support  - 2 x daily - 7 x weekly - 2 sets - 10 reps - Sit to Stand without Arm Support  - 2 x daily - 7 x weekly - 2 sets - 10 reps - Seated Knee Flexion Stretch  - 2 x daily - 7 x weekly - 2 sets - 10 reps - Seated Hamstring Stretch  - 2 x daily - 7 x weekly - 3 sets - 10 reps - Standing Hip Abduction with Counter Support  - 2 x daily - 7 x weekly - 3 sets - 10 reps - Heel Raises with Counter Support  - 2 x daily - 7 x weekly - 3 sets - 10 reps - Standing Hip Extension with Counter Support  - 2 x daily - 7 x weekly - 3 sets - 10 reps - Gastroc Stretch on Wall  - 2 x daily - 7 x weekly - 3 sets - 10 reps - Seated Knee Extension Stretch with Chair  - 1 x daily - 7 x weekly - 1 sets - 1 reps - 5 min hold - Standing Knee Flexion  - 1 x daily - 7 x weekly - 2-3 sets - 10 reps - [place]  - 1 x daily - 7 x weekly - 2-3 sets - 10 reps - Standing Terminal Knee Extension at [employer] with Ball  - 1 x daily - 7 x weekly - 3  sets - 10 reps   [email or web address] ASSESSMENT:  CLINICAL IMPRESSION: Seras Koons Heying continues to make good progress, today progressed exercise to include resistance machines with good tolerance and no pain, focusing on eccentric control.  She was able to place her leg flat against table with stretching at end of session, so extension continues to improve, although during gait and exercises still lacking full extension.    Rivka Spring Corona continues to demonstrate potential for improvement and would benefit from continued skilled therapy to address impairments.     OBJECTIVE IMPAIRMENTS: Abnormal gait, decreased activity tolerance, decreased balance, decreased  endurance, decreased mobility, difficulty walking, decreased ROM, decreased strength, increased edema, increased fascial restrictions, increased muscle spasms, impaired flexibility, and pain.   ACTIVITY LIMITATIONS: carrying, lifting, bending, sitting, standing, squatting, sleeping, stairs, transfers, locomotion level, and caring for others   PARTICIPATION LIMITATIONS: meal prep, cleaning, laundry, driving, shopping, community activity, yard work, and leisure activities  PERSONAL FACTORS: Time since onset of injury/illness/exacerbation and 1-2 comorbidities: low back pain, history of L TKA, R ankle pain, HTN  are also affecting patient's functional outcome.   REHAB POTENTIAL: Excellent  CLINICAL DECISION MAKING: Evolving/moderate complexity  EVALUATION COMPLEXITY: Moderate   GOALS: Goals reviewed with patient? Yes   SHORT TERM GOALS: Target date: 11/07/2023   Independent with initial HEP. Baseline:  Goal status: MET 10/23/23 good compliance, independent.   2.  Patient will report 50% improvement in LBP/sciatica symptoms.  Baseline: 4/10 pain, interrupts sleep Goal status: IN PROGRESS 10/23/23 30-40% improvement   LONG TERM GOALS: Target date: 11/28/2023   Independent with advanced/ongoing HEP to improve outcomes and carryover.  Baseline:  Goal status: IN PROGRESS  2.  Rivka Spring Fulp will demonstrate right knee flexion to 120 deg to ascend/descend stairs. Baseline: 90 Goal status: IN PROGRESS 10/23/23 103  3.  Rivka Spring Kreps will demonstrate full right knee extension for safety with gait. Baseline: lacking 15 Goal status: IN PROGRESS 10/23/23 lacking 8  4.  Rivka Spring Matheney will be able to ambulate 600' safely without and normal gait pattern to access community.  Baseline: using SPC Goal status: IN PROGRESS  5.  Rivka Spring Mcphail will be able to ascend/descend stairs with 1 HR and reciprocal step pattern safely to access home and community.  Baseline: step to  gait Goal status: IN PROGRESS  6.  Rivka Spring Coppess will demonstrate > 20/30 on FGA to demonstrate decreased risk of falls.   Baseline: NT Goal status: IN PROGRESS  7.  Rivka Spring Attig will demonstrate improved functional LE strength by completing 5x STS in <14 seconds.  Baseline: NT Goal status: IN PROGRESS  8.  Rivka Spring Avellino will report < 18/45 on Modified Oswestry to demonstrate improved quality of life.  Baseline: 24/45 on Modified Oswestry Goal status: IN PROGRESS  9.  Rivka Spring Natzke will report 75% improvement in LBP/sciatica symptoms.  Baseline:  Goal status: IN PROGRESS    PLAN:  PT FREQUENCY: 2-3x/week  PT DURATION: 6 weeks  PLANNED INTERVENTIONS: 97164- PT Re-evaluation, 97110-Therapeutic exercises, 97530- Therapeutic activity, 97112- Neuromuscular re-education, 97535- Self Care, 23557- Manual therapy, [id number]- Gait training, [phone]- Orthotic Fit/training, 97014- Electrical stimulation (unattended), [id number]- Electrical stimulation (manual), [id number]- Vasopneumatic device, [id number]- Ultrasound, [id number]- Traction (mechanical), Patient/Family education, Balance training, Stair training, Taping, Dry Needling, Joint mobilization, Joint manipulation, Spinal manipulation, Spinal mobilization, Scar mobilization, Cryotherapy, and Moist heat  PLAN FOR NEXT SESSION: PROGRESS NOTE, SEND TO MD, review and progress HEP for knee, progress LE strength,  balance and ROM.  Measure ROM weekly. Start gym equipment, has membership at sports center.  Jena Gauss, PT 11/12/2023 1:04 PM

## 2023-11-15 ENCOUNTER — Encounter: Payer: Self-pay | Admitting: Orthopaedic Surgery

## 2023-11-15 ENCOUNTER — Ambulatory Visit: Payer: Medicare Other | Attending: Orthopaedic Surgery

## 2023-11-15 ENCOUNTER — Ambulatory Visit: Payer: Medicare Other | Admitting: Orthopaedic Surgery

## 2023-11-15 DIAGNOSIS — M5441 Lumbago with sciatica, right side: Secondary | ICD-10-CM | POA: Diagnosis not present

## 2023-11-15 DIAGNOSIS — M6281 Muscle weakness (generalized): Secondary | ICD-10-CM | POA: Diagnosis not present

## 2023-11-15 DIAGNOSIS — R600 Localized edema: Secondary | ICD-10-CM | POA: Diagnosis not present

## 2023-11-15 DIAGNOSIS — M5432 Sciatica, left side: Secondary | ICD-10-CM

## 2023-11-15 DIAGNOSIS — M25571 Pain in right ankle and joints of right foot: Secondary | ICD-10-CM | POA: Insufficient documentation

## 2023-11-15 DIAGNOSIS — M25561 Pain in right knee: Secondary | ICD-10-CM | POA: Insufficient documentation

## 2023-11-15 DIAGNOSIS — Z96651 Presence of right artificial knee joint: Secondary | ICD-10-CM

## 2023-11-15 DIAGNOSIS — M25661 Stiffness of right knee, not elsewhere classified: Secondary | ICD-10-CM | POA: Insufficient documentation

## 2023-11-15 DIAGNOSIS — M5431 Sciatica, right side: Secondary | ICD-10-CM

## 2023-11-15 NOTE — Progress Notes (Signed)
 The patient continues in recovery from her right total knee arthroplasty now about 6 to 7 weeks out.  She is doing great with the right knee.  She is still significantly plagued by low back pain and bilateral sciatica.  We have had her on Neurontin  and they are even performing physical therapy on her lumbar spine with dry needling.  She would be doing much better with her right knee if it was not for her spine.  I did review the notes and physical therapy and listed her as far as how her knee is doing.  And she is making excellent progress with her knee.  She has really good range of motion at 6 weeks of her right knee with almost full extension and getting close to full flexion.  The knee feels stable.  It is still swollen to be expected.  She still has a positive straight leg raise bilaterally and significant bilateral sciatica and bilateral low back pain.  This point a MRI is warranted of her lumbar spine to rule out nerve compression given her continued sciatica and failed conservative treatment including physical therapy.  We will see her in follow-up once we have MRI of her lumbar spine.  She agrees with the treatment plan.

## 2023-11-15 NOTE — Therapy (Addendum)
 OUTPATIENT PHYSICAL THERAPY LOWER EXTREMITY TREATMENT Progress Note Reporting Period 10/17/2023 to 11/15/2023  See note below for Objective Data and Assessment of Progress/Goals.   I have reviewed patients progress and current POC is appropriate.   Almarie JINNY Sprinkles, PT, DPT    Patient Name: Kristin Benjamin MRN: [id number] DOB:03-30-1956, 68 y.o., female Today's Date: 11/15/2023  END OF SESSION:  PT End of Session - 11/15/23 0854     Visit Number 10    Date for PT Re-Evaluation 11/28/23    Authorization Type [employer]    Progress Note Due on Visit 10    PT Start Time [phone]    PT Stop Time 0930    PT Time Calculation (min) 44 min    Activity Tolerance Patient tolerated treatment well    Behavior During Therapy [facility] for tasks assessed/performed                  Past Medical History:  Diagnosis Date   Allergy    Anxiety    Arthritis    Depression    Headache    history of   Heart murmur    in the past   Hx of adenomatous colonic polyps 07/17/2017   Hyperlipidemia    Hypertension    Past Surgical History:  Procedure Laterality Date   bladder tack     COLONOSCOPY  2006   and 2018   FUNCTIONAL ENDOSCOPIC SINUS SURGERY     JOINT REPLACEMENT     Left total knee arthroplasty Dr. Vernetta 10-18-18   KNEE ARTHROSCOPY W/ MENISCAL REPAIR Right 08/2016   TOTAL KNEE ARTHROPLASTY Left 10/18/2018   Procedure: LEFT TOTAL KNEE ARTHROPLASTY;  Surgeon: Vernetta Lonni GRADE, MD;  Location: [facility];  Service: Orthopedics;  Laterality: Left;   TOTAL KNEE ARTHROPLASTY Right 09/28/2023   Procedure: RIGHT TOTAL KNEE ARTHROPLASTY;  Surgeon: Vernetta Lonni GRADE, MD;  Location: [facility];  Service: Orthopedics;  Laterality: Right;   TUBAL LIGATION  1993   Patient Active Problem List   Diagnosis Date Noted   Status post total right knee replacement 09/28/2023   Frequent UTI 06/25/2023   Coronary artery disease involving native coronary artery of native heart without angina  pectoris 05/21/2023   Aortic atherosclerosis (HCC) 05/21/2023   Acute cystitis without hematuria 04/17/2023   High risk medication use 12/18/2022   Attention deficit hyperactivity disorder (ADHD) 05/18/2022   Mixed hyperlipidemia 04/27/2022   Primary hypertension 04/27/2022   Anxiety and depression 04/27/2022   Pes anserinus bursitis of right knee 02/16/2020   Status post total left knee replacement 10/18/2018   Chronic pain of right knee 03/28/2018   Hx of adenomatous colonic polyps 07/17/2017    PCP: [name] NOVAK, NP   REFERRING PROVIDER: Vernetta Lonni GRADE*   REFERRING DIAG: F45.68,F45.67 (ICD-10-CM) - Bilateral sciatica   THERAPY DIAG:  Acute pain of right knee  Stiffness of right knee, not elsewhere classified  Localized edema  Muscle weakness (generalized)  Acute right-sided low back pain with right-sided sciatica  Rationale for Evaluation and Treatment: Rehabilitation  ONSET DATE: s/p R TKR on 09/28/2023  SUBJECTIVE:   SUBJECTIVE STATEMENT: Pt reports her sciatic pain was a little bothersome earlier, her knee feels fine though. She also goes to careers adviser today.  NEXT MD VISIT: 11/14/2022 with Dr. Vernetta  PERTINENT HISTORY: L TKA 2019, R ankle pain, HTN, low back pain PAIN:  Are you having pain? Yes: NPRS scale: 0/10 Pain location: R knee Pain description: ache Aggravating factors:  bending Relieving factors: ice  Are you having pain? Yes: NPRS scale: 4/10 Pain location: Low back Pain description: toothache, stiff  Aggravating factors: bending knee, overuse  Relieving factors: ice, movement   PRECAUTIONS: None  RED FLAGS: None   WEIGHT BEARING RESTRICTIONS: No  FALLS:  Has patient fallen in last 6 months? No  LIVING ENVIRONMENT: Lives with: lives with their spouse Lives in: House/apartment Stairs: Yes: Internal: 14 steps; on left going up and External: 2 steps; none Has following equipment at home: Single point cane and Walker - 2  wheeled  OCCUPATION: retired  PLOF: Independent and Leisure: pickleball, swim, walk  PATIENT GOALS: get back to walking, pickleball, and activities   OBJECTIVE:  Note: Objective measures were completed at Evaluation unless otherwise noted.  DIAGNOSTIC FINDINGS: DG R knee 09/28/23 IMPRESSION: Well-positioned right total knee arthroplasty.  No imaging of low back  PATIENT SURVEYS:  Modified Oswestry 24/45   COGNITION: Overall cognitive status: Within functional limits for tasks assessed     SENSATION: WFL  EDEMA:  Circumferential: 45cm R, 41.5 cm L knee  MUSCLE LENGTH: NT  POSTURE: No Significant postural limitations  PALPATION: Tenderness R glutes/piriformis. Incision covered with steristrips.  No tenderness in calf.  No signs of infection.   LUMBAR ROM:   NT today due to R knee pain and poor tolerance to standing.   Active  A/PROM  eval  Flexion   Extension   Right lateral flexion   Left lateral flexion   Right rotation   Left rotation    (Blank rows = not tested)    LOWER EXTREMITY ROM:  Active ROM Right eval Left eval Right 10/23/23 Right 11/08/23 R 11/15/23  Knee flexion 90 130 103 110 113  Knee extension Lacking 15 0 Lacking 8 Lacking 4  3    (Blank rows = not tested)  LOWER EXTREMITY MMT:  MMT Right eval Left eval  Hip flexion 3 5  Hip extension    Hip abduction    Hip adduction    Knee flexion 3+ 5  Knee extension 3+ 5  Ankle dorsiflexion 5 5  Ankle plantarflexion     (Blank rows = not tested)  LUMBAR SPECIAL TESTS:  Slump test: Negative   FUNCTIONAL TESTS:  5 times sit to stand: 12.24 sec 11/15/23  GAIT: Distance walked: 50' Assistive device utilized: Single point cane Level of assistance: Modified independence Comments: slightly antalgic, slightly decreased speed.     TODAY'S TREATMENT:                                                                                                                              DATE:   11/15/23 Therapeutic Exercise: to improve strength and mobility.  Demo, verbal and tactile cues throughout for technique. Bike L3 x 6 min  Assessed gait, stairs, 5xSTS, knee AROM for PN Supine sciatic nerve glide 2x10 5xSTS- 12.24 sec  Knee flexion stretch 10x5 on step Knee flexion  with strap and peanut ball x 10 Knee flexion 15# x 10 B con/R ecc Knee extension 10# x 10 B con/R ecc  11/12/23 Therapeutic Exercise: to improve strength and mobility.  Demo, verbal and tactile cues throughout for technique. Bike L2 x 6 min  Knee flexion stretch x 10 Runners stretch with ER and extension applied by PT 5 x 10 sec holds Knee extension 15# 1 x 10 BLE, 1 x 10 BLE concentric, RLE eccentric lower Knee flexion 15# 1 x 10 BLE, 1 x 10 BLE concentric, RLE eccentric lower Leg Press 20# 2 x 10, Toe press x 15 Leg stretch on bench   11/08/23 Therapeutic Exercise: to improve strength and mobility.  Demo, verbal and tactile cues throughout for technique. Bike L2 x 6 min full revolutions seat 7 Step ups (2 risers 2 x 12 RLE Side step ups (2 risers) 2 x 12 RLE Step dows (1 riser) x 12 RLE in stance for eccentric strenthening Runners stretch with ER and extension applied by PT 5 x 10 sec holds TKE with towel behind knee x 15 Supine heel slide with strap x 10 Manual Therapy: to decrease muscle spasm and pain and improve mobility Scar tissue mobilization - limited due to sensitivity Patellar mobs - limited due to sensitivity, noted significant edema still present Prone knee flexion stretch -with hip hyperextended to stretch rectus femoris, isometric quad stretch to fatigue and allow deeper stretch, to improve flexion, rotational movements to relax glutes R tib fib mobs  11/05/23 Therapeutic Exercise: to improve strength and mobility.  Demo, verbal and tactile cues throughout for technique. Bike L1x89min Fwd step ups x 15 6' Lateral step ups x 15 6' Runner stretch 3x15 manual blocking of tibia Gastroc  step stretch 3x15 manual blocking of tibia Prone hang reviewed R SLR 1# 2x10 Seated R LAQ 1# 2x10 HS curls RTB x 10   Manual Therapy: Supine R tibiofemoral AP mobs  and passive knee extension   PATIENT EDUCATION:  Education details: HEP review Person educated: Patient Education method: Programmer, Multimedia, Facilities Manager, Verbal cues, and Handouts Education comprehension: verbalized understanding and returned demonstration  HOME EXERCISE PROGRAM: Access Code: [email or web address] URL: [email or web address] Date: 11/08/2023 Prepared by: Almarie Sprinkles  Exercises - Supine Quad Set  - 2 x daily - 7 x weekly - 2 sets - 10 reps - 5-10 sec hold - Supine Short Arc Quad  - 2 x daily - 7 x weekly - 2 sets - 10 reps - Straight Leg Raise  - 2 x daily - 7 x weekly - 2 sets - 10 reps - Supine Heel Slide with Strap  - 2 x daily - 7 x weekly - 2 sets - 10 reps - Mini Squat with Counter Support  - 2 x daily - 7 x weekly - 2 sets - 10 reps - Sit to Stand without Arm Support  - 2 x daily - 7 x weekly - 2 sets - 10 reps - Seated Knee Flexion Stretch  - 2 x daily - 7 x weekly - 2 sets - 10 reps - Seated Hamstring Stretch  - 2 x daily - 7 x weekly - 3 sets - 10 reps - Standing Hip Abduction with Counter Support  - 2 x daily - 7 x weekly - 3 sets - 10 reps - Heel Raises with Counter Support  - 2 x daily - 7 x weekly - 3 sets - 10 reps - Standing Hip Extension with Counter Support  - 2 x  daily - 7 x weekly - 3 sets - 10 reps - Gastroc Stretch on Wall  - 2 x daily - 7 x weekly - 3 sets - 10 reps - Seated Knee Extension Stretch with Chair  - 1 x daily - 7 x weekly - 1 sets - 1 reps - 5 min hold - Standing Knee Flexion  - 1 x daily - 7 x weekly - 2-3 sets - 10 reps - [place]  - 1 x daily - 7 x weekly - 2-3 sets - 10 reps - Standing Terminal Knee Extension at [employer] with Ball  - 1 x daily - 7 x weekly - 3 sets - 10  reps   [email or web address] ASSESSMENT:  CLINICAL IMPRESSION: Pt shows great progress with PT. She is able to navigate stairs with very little difficulty. Her gait looks fine just shows decreased R knee flexion during swing phase. She completed 5xSTS in 12.24 seconds. Knee AROM improved both ways now measured at 3-113 deg. Her sciatic low back pain has great improved as well. She has met LTGs #4, #5, #7, #8. Kristin Benjamin continues to demonstrate potential for improvement and would benefit from continued skilled therapy to address impairments.     OBJECTIVE IMPAIRMENTS: Abnormal gait, decreased activity tolerance, decreased balance, decreased endurance, decreased mobility, difficulty walking, decreased ROM, decreased strength, increased edema, increased fascial restrictions, increased muscle spasms, impaired flexibility, and pain.   ACTIVITY LIMITATIONS: carrying, lifting, bending, sitting, standing, squatting, sleeping, stairs, transfers, locomotion level, and caring for others   PARTICIPATION LIMITATIONS: meal prep, cleaning, laundry, driving, shopping, community activity, yard work, and leisure activities  PERSONAL FACTORS: Time since onset of injury/illness/exacerbation and 1-2 comorbidities: low back pain, history of L TKA, R ankle pain, HTN  are also affecting patient's functional outcome.   REHAB POTENTIAL: Excellent  CLINICAL DECISION MAKING: Evolving/moderate complexity  EVALUATION COMPLEXITY: Moderate   GOALS: Goals reviewed with patient? Yes   SHORT TERM GOALS: Target date: 11/07/2023   Independent with initial HEP. Baseline:  Goal status: MET 10/23/23 good compliance, independent.   2.  Patient will report 50% improvement in LBP/sciatica symptoms.  Baseline: 4/10 pain, interrupts sleep Goal status: IN PROGRESS 10/23/23 30-40% improvement   LONG TERM GOALS: Target date: 11/28/2023   Independent with advanced/ongoing  HEP to improve outcomes and carryover.  Baseline:  Goal status: IN PROGRESS  2.  Kristin Benjamin will demonstrate right knee flexion to 120 deg to ascend/descend stairs. Baseline: 90 Goal status: IN PROGRESS 11/15/23 see chart  3.  Kristin Benjamin will demonstrate full right knee extension for safety with gait. Baseline: lacking 15 Goal status: IN PROGRESS 11/15/23 see chart  4.  Kristin Benjamin will be able to ambulate 600' safely without and normal gait pattern to access community.  Baseline: using SPC Goal status: MET- 11/15/23  5.  Kristin Benjamin will be able to ascend/descend stairs with 1 HR and reciprocal step pattern safely to access home and community.  Baseline: step to gait Goal status: MET- 11/15/23  6.  Kristin Benjamin will demonstrate > 20/30 on FGA to demonstrate decreased risk of falls.   Baseline: NT Goal status: IN PROGRESS  7.  Kristin Benjamin will demonstrate improved functional LE strength by completing 5x STS in <14 seconds.  Baseline: NT Goal status: MET- 11/15/23  8.  Kristin Benjamin will report < 18/45 on Modified Oswestry to demonstrate improved quality of life.  Baseline: 24/45 on Modified Oswestry Goal status:  IN PROGRESS  9.  Kristin Benjamin will report 75% improvement in LBP/sciatica symptoms.  Baseline:  Goal status: MET- 11/15/23 80%     PLAN:  PT FREQUENCY: 2-3x/week  PT DURATION: 6 weeks  PLANNED INTERVENTIONS: 97164- PT Re-evaluation, 97110-Therapeutic exercises, 97530- Therapeutic activity, 97112- Neuromuscular re-education, 97535- Self Care, [id number]- Manual therapy, [id number]- Gait training, [phone]- Orthotic Fit/training, 97014- Electrical stimulation (unattended), [id number]- Electrical stimulation (manual), 97016- Vasopneumatic device, [id number]- Ultrasound, 02987- Traction (mechanical), Patient/Family education, Balance training, Stair training, Taping, Dry Needling, Joint mobilization, Joint manipulation, Spinal manipulation, Spinal mobilization, Scar  mobilization, Cryotherapy, and Moist heat  PLAN FOR NEXT SESSION: review and progress HEP for knee, progress LE strength, balance and ROM.  Measure ROM weekly. Start gym equipment, has membership at sports center.  Zakirah Weingart L Layton Naves, PTA 11/15/2023 9:33 AM

## 2023-11-16 ENCOUNTER — Other Ambulatory Visit: Payer: Self-pay

## 2023-11-16 DIAGNOSIS — M5432 Sciatica, left side: Secondary | ICD-10-CM

## 2023-11-19 ENCOUNTER — Ambulatory Visit: Payer: Medicare Other

## 2023-11-19 DIAGNOSIS — M25661 Stiffness of right knee, not elsewhere classified: Secondary | ICD-10-CM

## 2023-11-19 DIAGNOSIS — M5441 Lumbago with sciatica, right side: Secondary | ICD-10-CM

## 2023-11-19 DIAGNOSIS — M25561 Pain in right knee: Secondary | ICD-10-CM

## 2023-11-19 DIAGNOSIS — M25571 Pain in right ankle and joints of right foot: Secondary | ICD-10-CM | POA: Diagnosis not present

## 2023-11-19 DIAGNOSIS — R600 Localized edema: Secondary | ICD-10-CM | POA: Diagnosis not present

## 2023-11-19 DIAGNOSIS — M6281 Muscle weakness (generalized): Secondary | ICD-10-CM | POA: Diagnosis not present

## 2023-11-19 NOTE — Therapy (Signed)
 OUTPATIENT PHYSICAL THERAPY LOWER EXTREMITY TREATMENT   Patient Name: Kristin Benjamin MRN: [id number] DOB:03/05/1956, 68 y.o., female Today's Date: 11/19/2023  END OF SESSION:  PT End of Session - 11/19/23 0938     Visit Number 11    Date for PT Re-Evaluation 11/28/23    Authorization Type [employer]    Progress Note Due on Visit 10    PT Start Time 0931    PT Stop Time 1015    PT Time Calculation (min) 44 min    Activity Tolerance Patient tolerated treatment well    Behavior During Therapy [facility] for tasks assessed/performed                   Past Medical History:  Diagnosis Date   Allergy    Anxiety    Arthritis    Depression    Headache    history of   Heart murmur    in the past   Hx of adenomatous colonic polyps 07/17/2017   Hyperlipidemia    Hypertension    Past Surgical History:  Procedure Laterality Date   bladder tack     COLONOSCOPY  2006   and 2018   FUNCTIONAL ENDOSCOPIC SINUS SURGERY     JOINT REPLACEMENT     Left total knee arthroplasty Dr. Vernetta 10-18-18   KNEE ARTHROSCOPY W/ MENISCAL REPAIR Right 08/2016   TOTAL KNEE ARTHROPLASTY Left 10/18/2018   Procedure: LEFT TOTAL KNEE ARTHROPLASTY;  Surgeon: Vernetta Lonni GRADE, MD;  Location: [facility];  Service: Orthopedics;  Laterality: Left;   TOTAL KNEE ARTHROPLASTY Right 09/28/2023   Procedure: RIGHT TOTAL KNEE ARTHROPLASTY;  Surgeon: Vernetta Lonni GRADE, MD;  Location: [facility];  Service: Orthopedics;  Laterality: Right;   TUBAL LIGATION  1993   Patient Active Problem List   Diagnosis Date Noted   Status post total right knee replacement 09/28/2023   Frequent UTI 06/25/2023   Coronary artery disease involving native coronary artery of native heart without angina pectoris 05/21/2023   Aortic atherosclerosis (HCC) 05/21/2023   Acute cystitis without hematuria 04/17/2023   High risk medication use 12/18/2022   Attention deficit hyperactivity disorder (ADHD) 05/18/2022   Mixed  hyperlipidemia 04/27/2022   Primary hypertension 04/27/2022   Anxiety and depression 04/27/2022   Pes anserinus bursitis of right knee 02/16/2020   Status post total left knee replacement 10/18/2018   Chronic pain of right knee 03/28/2018   Hx of adenomatous colonic polyps 07/17/2017    PCP: [name] NOVAK, NP   REFERRING PROVIDER: Vernetta Lonni GRADE*   REFERRING DIAG: F45.68,F45.67 (ICD-10-CM) - Bilateral sciatica   THERAPY DIAG:  Acute pain of right knee  Stiffness of right knee, not elsewhere classified  Localized edema  Muscle weakness (generalized)  Acute right-sided low back pain with right-sided sciatica  Rationale for Evaluation and Treatment: Rehabilitation  ONSET DATE: s/p R TKR on 09/28/2023  SUBJECTIVE:   SUBJECTIVE STATEMENT: Pt reports her sciatic pain gets worse when she bends or straightens her knee. She was sedentary over the weekend so she thinks this may have flared it up.   NEXT MD VISIT: 11/14/2022 with Dr. Vernetta  PERTINENT HISTORY: L TKA 2019, R ankle pain, HTN, low back pain PAIN:  Are you having pain? Yes: NPRS scale: 0/10 Pain location: R knee Pain description: ache Aggravating factors: bending Relieving factors: ice  Are you having pain? Yes: NPRS scale: 6/10 Pain location: Low back Pain description: toothache, stiff  Aggravating factors: bending knee, overuse  Relieving factors: ice, movement   PRECAUTIONS: None  RED FLAGS: None   WEIGHT BEARING RESTRICTIONS: No  FALLS:  Has patient fallen in last 6 months? No  LIVING ENVIRONMENT: Lives with: lives with their spouse Lives in: House/apartment Stairs: Yes: Internal: 14 steps; on left going up and External: 2 steps; none Has following equipment at home: Single point cane and Walker - 2 wheeled  OCCUPATION: retired  PLOF: Independent and Leisure: pickleball, swim, walk  PATIENT GOALS: get back to walking, pickleball, and activities   OBJECTIVE:  Note: Objective  measures were completed at Evaluation unless otherwise noted.  DIAGNOSTIC FINDINGS: DG R knee 09/28/23 IMPRESSION: Well-positioned right total knee arthroplasty.  No imaging of low back  PATIENT SURVEYS:  Modified Oswestry 24/45   COGNITION: Overall cognitive status: Within functional limits for tasks assessed     SENSATION: WFL  EDEMA:  Circumferential: 45cm R, 41.5 cm L knee  MUSCLE LENGTH: NT  POSTURE: No Significant postural limitations  PALPATION: Tenderness R glutes/piriformis. Incision covered with steristrips.  No tenderness in calf.  No signs of infection.   LUMBAR ROM:   NT today due to R knee pain and poor tolerance to standing.   Active  A/PROM  eval  Flexion   Extension   Right lateral flexion   Left lateral flexion   Right rotation   Left rotation    (Blank rows = not tested)    LOWER EXTREMITY ROM:  Active ROM Right eval Left eval Right 10/23/23 Right 11/08/23 R 11/15/23  Knee flexion 90 130 103 110 113  Knee extension Lacking 15 0 Lacking 8 Lacking 4  3    (Blank rows = not tested)  LOWER EXTREMITY MMT:  MMT Right eval Left eval  Hip flexion 3 5  Hip extension    Hip abduction    Hip adduction    Knee flexion 3+ 5  Knee extension 3+ 5  Ankle dorsiflexion 5 5  Ankle plantarflexion     (Blank rows = not tested)  LUMBAR SPECIAL TESTS:  Slump test: Negative   FUNCTIONAL TESTS:  5 times sit to stand: 12.24 sec 11/15/23  GAIT: Distance walked: 50' Assistive device utilized: Single point cane Level of assistance: Modified independence Comments: slightly antalgic, slightly decreased speed.     TODAY'S TREATMENT:                                                                                                                              DATE:  11/19/23 Therapeutic Exercise: to improve strength and mobility.  Demo, verbal and tactile cues throughout for technique. Bike L3 x 6 min Sciatic nerve glides seated x 10  Leg press 20lb  2x10 BLE Knee flexion 20# B con/R ecc x 10; 10lb x 10 RLE Knee extension 10lb x 10 B con/ R ecc; x 10lb RLE Standing lumbar extension x 8  Mod thomas stretch with strap 2x30 R side R hamstring stretch 2x30  with strap Therapeutic Activity: FGA assessed- see under goals 11/15/23 Therapeutic Exercise: to improve strength and mobility.  Demo, verbal and tactile cues throughout for technique. Bike L3 x 6 min  Assessed gait, stairs, 5xSTS, knee AROM for PN Supine sciatic nerve glide 2x10 5xSTS- 12.24 sec  Knee flexion stretch 10x5 on step Knee flexion with strap and peanut ball x 10 Knee flexion 15# x 10 B con/R ecc Knee extension 10# x 10 B con/R ecc  11/12/23 Therapeutic Exercise: to improve strength and mobility.  Demo, verbal and tactile cues throughout for technique. Bike L2 x 6 min  Knee flexion stretch x 10 Runners stretch with ER and extension applied by PT 5 x 10 sec holds Knee extension 15# 1 x 10 BLE, 1 x 10 BLE concentric, RLE eccentric lower Knee flexion 15# 1 x 10 BLE, 1 x 10 BLE concentric, RLE eccentric lower Leg Press 20# 2 x 10, Toe press x 15 Leg stretch on bench   11/08/23 Therapeutic Exercise: to improve strength and mobility.  Demo, verbal and tactile cues throughout for technique. Bike L2 x 6 min full revolutions seat 7 Step ups (2 risers 2 x 12 RLE Side step ups (2 risers) 2 x 12 RLE Step dows (1 riser) x 12 RLE in stance for eccentric strenthening Runners stretch with ER and extension applied by PT 5 x 10 sec holds TKE with towel behind knee x 15 Supine heel slide with strap x 10 Manual Therapy: to decrease muscle spasm and pain and improve mobility Scar tissue mobilization - limited due to sensitivity Patellar mobs - limited due to sensitivity, noted significant edema still present Prone knee flexion stretch -with hip hyperextended to stretch rectus femoris, isometric quad stretch to fatigue and allow deeper stretch, to improve flexion, rotational movements  to relax glutes R tib fib mobs  11/05/23 Therapeutic Exercise: to improve strength and mobility.  Demo, verbal and tactile cues throughout for technique. Bike L1x64min Fwd step ups x 15 6' Lateral step ups x 15 6' Runner stretch 3x15 manual blocking of tibia Gastroc step stretch 3x15 manual blocking of tibia Prone hang reviewed R SLR 1# 2x10 Seated R LAQ 1# 2x10 HS curls RTB x 10   Manual Therapy: Supine R tibiofemoral AP mobs  and passive knee extension   PATIENT EDUCATION:  Education details: HEP review Person educated: Patient Education method: Programmer, Multimedia, Facilities Manager, Verbal cues, and Handouts Education comprehension: verbalized understanding and returned demonstration  HOME EXERCISE PROGRAM: Access Code: [email or web address] URL: [email or web address] Date: 11/19/2023 Prepared by: Ruthe Roemer  Exercises - Supine Quad Set  - 2 x daily - 7 x weekly - 2 sets - 10 reps - 5-10 sec hold - Supine Short Arc Quad  - 2 x daily - 7 x weekly - 2 sets - 10 reps - Straight Leg Raise  - 2 x daily - 7 x weekly - 2 sets - 10 reps - Supine Heel Slide with Strap  - 2 x daily - 7 x weekly - 2 sets - 10 reps - Mini Squat with Counter Support  - 2 x daily - 7 x weekly - 2 sets - 10 reps - Sit to Stand without Arm Support  - 2 x daily - 7 x weekly - 2 sets - 10 reps - Seated Knee Flexion Stretch  - 2 x daily - 7 x weekly - 2 sets - 10 reps - Seated Hamstring Stretch  - 2 x daily - 7 x weekly - 3 sets -  10 reps - Standing Hip Abduction with Counter Support  - 2 x daily - 7 x weekly - 3 sets - 10 reps - Heel Raises with Counter Support  - 2 x daily - 7 x weekly - 3 sets - 10 reps - Standing Hip Extension with Counter Support  - 2 x daily - 7 x weekly - 3 sets - 10 reps - Gastroc Stretch on Wall  - 2 x daily - 7 x weekly - 3 sets - 10 reps - Seated Knee Extension Stretch with Chair  - 1 x daily - 7 x weekly - 1 sets - 1 reps - 5 min hold - Standing Knee Flexion  - 1 x daily - 7 x  weekly - 2-3 sets - 10 reps - [place]  - 1 x daily - 7 x weekly - 2-3 sets - 10 reps - Standing Terminal Knee Extension at [employer] with Ball  - 1 x daily - 7 x weekly - 3 sets - 10 reps - Standing Lumbar Extension  - 1 x daily - 7 x weekly - 2 sets - 5 reps - Seated Sciatic Tensioner  - 1 x daily - 7 x weekly - 2 sets - 10 reps   [email or web address] ASSESSMENT:  CLINICAL IMPRESSION: Pt notes good report from MD, he is pleased with progress of R knee, however wanting to get MRI of her lumbar spine for sciatic low back pain. FGA was assessed with her showing low fall risk.  Her sciatic pain was elevated this morning so we focused on machnine strengthening for her R knee while intermittently performing exercises to address LBP. Updated HEP for lumbar extension and sciatic nerve glide since she had good response today. She will need reassessment by PT next visit. Barnie DASEN Doten continues to demonstrate potential for improvement and would benefit from continued skilled therapy to address impairments.     OBJECTIVE IMPAIRMENTS: Abnormal gait, decreased activity tolerance, decreased balance, decreased endurance, decreased mobility, difficulty walking, decreased ROM, decreased strength, increased edema, increased fascial restrictions, increased muscle spasms, impaired flexibility, and pain.   ACTIVITY LIMITATIONS: carrying, lifting, bending, sitting, standing, squatting, sleeping, stairs, transfers, locomotion level, and caring for others   PARTICIPATION LIMITATIONS: meal prep, cleaning, laundry, driving, shopping, community activity, yard work, and leisure activities  PERSONAL FACTORS: Time since onset of injury/illness/exacerbation and 1-2 comorbidities: low back pain, history of L TKA, R ankle pain, HTN  are also affecting patient's functional outcome.   REHAB POTENTIAL: Excellent  CLINICAL DECISION MAKING: Evolving/moderate complexity  EVALUATION  COMPLEXITY: Moderate   GOALS: Goals reviewed with patient? Yes   SHORT TERM GOALS: Target date: 11/07/2023   Independent with initial HEP. Baseline:  Goal status: MET 10/23/23 good compliance, independent.   2.  Patient will report 50% improvement in LBP/sciatica symptoms.  Baseline: 4/10 pain, interrupts sleep Goal status: IN PROGRESS 10/23/23 30-40% improvement   LONG TERM GOALS: Target date: 11/28/2023   Independent with advanced/ongoing HEP to improve outcomes and carryover.  Baseline:  Goal status: IN PROGRESS  2.  Barnie DASEN Lamia will demonstrate right knee flexion to 120 deg to ascend/descend stairs. Baseline: 90 Goal status: IN PROGRESS 11/15/23 see chart  3.  Barnie DASEN Vorhees will demonstrate full right knee extension for safety with gait. Baseline: lacking 15 Goal status: IN PROGRESS 11/15/23 see chart  4.  Barnie DASEN Ulibarri will be able to ambulate 600' safely without and normal gait pattern to access community.  Baseline: using SPC Goal  status: MET- 11/15/23  5.  Barnie DASEN Longoria will be able to ascend/descend stairs with 1 HR and reciprocal step pattern safely to access home and community.  Baseline: step to gait Goal status: MET- 11/15/23  6.  Barnie DASEN Baltes will demonstrate > 20/30 on FGA to demonstrate decreased risk of falls.   Baseline: NT Goal status: MET- 11/19/23 25 / 30 = 83.3 %  7.  Barnie DASEN Allemand will demonstrate improved functional LE strength by completing 5x STS in <14 seconds.  Baseline: NT Goal status: MET- 11/15/23  8.  Barnie DASEN Raneri will report < 18/45 on Modified Oswestry to demonstrate improved quality of life.  Baseline: 24/45 on Modified Oswestry Goal status: IN PROGRESS  9.  Barnie DASEN Stepanian will report 75% improvement in LBP/sciatica symptoms.  Baseline:  Goal status: MET- 11/15/23 80%     PLAN:  PT FREQUENCY: 2-3x/week  PT DURATION: 6 weeks  PLANNED INTERVENTIONS: 97164- PT Re-evaluation, 97110-Therapeutic exercises, 97530-  Therapeutic activity, 97112- Neuromuscular re-education, 97535- Self Care, [id number]- Manual therapy, [id number]- Gait training, [phone]- Orthotic Fit/training, 97014- Electrical stimulation (unattended), [id number]- Electrical stimulation (manual), 97016- Vasopneumatic device, [id number]- Ultrasound, 02987- Traction (mechanical), Patient/Family education, Balance training, Stair training, Taping, Dry Needling, Joint mobilization, Joint manipulation, Spinal manipulation, Spinal mobilization, Scar mobilization, Cryotherapy, and Moist heat  PLAN FOR NEXT SESSION: may need to reassess low back; review and progress HEP for knee, progress LE strength, balance and ROM.  Measure ROM weekly. Start gym equipment, has membership at sports center.  Collyn Selk L Christphor Groft, PTA 11/19/2023 10:29 AM

## 2023-11-20 ENCOUNTER — Encounter: Payer: Self-pay | Admitting: Orthopaedic Surgery

## 2023-11-22 ENCOUNTER — Ambulatory Visit: Payer: Medicare Other | Admitting: Physical Therapy

## 2023-11-22 ENCOUNTER — Encounter: Payer: Self-pay | Admitting: Physical Therapy

## 2023-11-22 DIAGNOSIS — M25571 Pain in right ankle and joints of right foot: Secondary | ICD-10-CM | POA: Diagnosis not present

## 2023-11-22 DIAGNOSIS — M25661 Stiffness of right knee, not elsewhere classified: Secondary | ICD-10-CM | POA: Diagnosis not present

## 2023-11-22 DIAGNOSIS — M5441 Lumbago with sciatica, right side: Secondary | ICD-10-CM | POA: Diagnosis not present

## 2023-11-22 DIAGNOSIS — M6281 Muscle weakness (generalized): Secondary | ICD-10-CM

## 2023-11-22 DIAGNOSIS — M25561 Pain in right knee: Secondary | ICD-10-CM

## 2023-11-22 DIAGNOSIS — R600 Localized edema: Secondary | ICD-10-CM

## 2023-11-22 NOTE — Therapy (Signed)
 OUTPATIENT PHYSICAL THERAPY LOWER EXTREMITY TREATMENT   Patient Name: KLOEY CAZAREZ MRN: [id number] DOB:08/22/1956, 68 y.o., female Today's Date: 11/22/2023  END OF SESSION:  PT End of Session - 11/22/23 0852     Visit Number 12    Date for PT Re-Evaluation 11/28/23    Authorization Type [employer]    Progress Note Due on Visit 10    PT Start Time 0848    PT Stop Time 0929    PT Time Calculation (min) 41 min    Activity Tolerance Patient tolerated treatment well    Behavior During Therapy [facility] for tasks assessed/performed                   Past Medical History:  Diagnosis Date   Allergy    Anxiety    Arthritis    Depression    Headache    history of   Heart murmur    in the past   Hx of adenomatous colonic polyps 07/17/2017   Hyperlipidemia    Hypertension    Past Surgical History:  Procedure Laterality Date   bladder tack     COLONOSCOPY  2006   and 2018   FUNCTIONAL ENDOSCOPIC SINUS SURGERY     JOINT REPLACEMENT     Left total knee arthroplasty Dr. Vernetta 10-18-18   KNEE ARTHROSCOPY W/ MENISCAL REPAIR Right 08/2016   TOTAL KNEE ARTHROPLASTY Left 10/18/2018   Procedure: LEFT TOTAL KNEE ARTHROPLASTY;  Surgeon: Vernetta Lonni GRADE, MD;  Location: [facility];  Service: Orthopedics;  Laterality: Left;   TOTAL KNEE ARTHROPLASTY Right 09/28/2023   Procedure: RIGHT TOTAL KNEE ARTHROPLASTY;  Surgeon: Vernetta Lonni GRADE, MD;  Location: [facility];  Service: Orthopedics;  Laterality: Right;   TUBAL LIGATION  1993   Patient Active Problem List   Diagnosis Date Noted   Status post total right knee replacement 09/28/2023   Frequent UTI 06/25/2023   Coronary artery disease involving native coronary artery of native heart without angina pectoris 05/21/2023   Aortic atherosclerosis (HCC) 05/21/2023   Acute cystitis without hematuria 04/17/2023   High risk medication use 12/18/2022   Attention deficit hyperactivity disorder (ADHD) 05/18/2022   Mixed  hyperlipidemia 04/27/2022   Primary hypertension 04/27/2022   Anxiety and depression 04/27/2022   Pes anserinus bursitis of right knee 02/16/2020   Status post total left knee replacement 10/18/2018   Chronic pain of right knee 03/28/2018   Hx of adenomatous colonic polyps 07/17/2017    PCP: [name] NOVAK, NP   REFERRING PROVIDER: Vernetta Lonni GRADE*   REFERRING DIAG: F45.68,F45.67 (ICD-10-CM) - Bilateral sciatica   THERAPY DIAG:  Acute pain of right knee  Stiffness of right knee, not elsewhere classified  Localized edema  Muscle weakness (generalized)  Acute right-sided low back pain with right-sided sciatica  Rationale for Evaluation and Treatment: Rehabilitation  ONSET DATE: s/p R TKR on 09/28/2023  SUBJECTIVE:   SUBJECTIVE STATEMENT: Knee is doing great but her sciatic is flaired up again. Started using cane yesterday to give sciatic a break which isn't great for her knee.   NEXT MD VISIT: 11/14/2022 with Dr. Vernetta  PERTINENT HISTORY: L TKA 2019, R ankle pain, HTN, low back pain PAIN:  Are you having pain? Yes: NPRS scale: 0/10 Pain location: R knee Pain description: ache Aggravating factors: bending Relieving factors: ice  Are you having pain? Yes: NPRS scale: 4-5/10 Pain location: Low back/sciatic Pain description: toothache, stiff  Aggravating factors: bending knee, overuse  Relieving factors: ice,  movement   PRECAUTIONS: None  RED FLAGS: None   WEIGHT BEARING RESTRICTIONS: No  FALLS:  Has patient fallen in last 6 months? No  LIVING ENVIRONMENT: Lives with: lives with their spouse Lives in: House/apartment Stairs: Yes: Internal: 14 steps; on left going up and External: 2 steps; none Has following equipment at home: Single point cane and Walker - 2 wheeled  OCCUPATION: retired  PLOF: Independent and Leisure: pickleball, swim, walk  PATIENT GOALS: get back to walking, pickleball, and activities   OBJECTIVE:  Note: Objective  measures were completed at Evaluation unless otherwise noted.  DIAGNOSTIC FINDINGS: DG R knee 09/28/23 IMPRESSION: Well-positioned right total knee arthroplasty.  No imaging of low back  PATIENT SURVEYS:  Modified Oswestry 24/45   COGNITION: Overall cognitive status: Within functional limits for tasks assessed     SENSATION: WFL  EDEMA:  Circumferential: 45cm R, 41.5 cm L knee  MUSCLE LENGTH: NT  POSTURE: No Significant postural limitations  PALPATION: Tenderness R glutes/piriformis. Incision covered with steristrips.  No tenderness in calf.  No signs of infection.   LUMBAR ROM:   NT today due to R knee pain and poor tolerance to standing.   Active  A/PROM  eval  Flexion   Extension   Right lateral flexion   Left lateral flexion   Right rotation   Left rotation    (Blank rows = not tested)    LOWER EXTREMITY ROM:  Active ROM Right eval Left eval Right 10/23/23 Right 11/08/23 R 11/15/23  Knee flexion 90 130 103 110 113  Knee extension Lacking 15 0 Lacking 8 Lacking 4  3    (Blank rows = not tested)  LOWER EXTREMITY MMT:  MMT Right eval Left eval  Hip flexion 3 5  Hip extension    Hip abduction    Hip adduction    Knee flexion 3+ 5  Knee extension 3+ 5  Ankle dorsiflexion 5 5  Ankle plantarflexion     (Blank rows = not tested)  LUMBAR SPECIAL TESTS:  Slump test: Negative   FUNCTIONAL TESTS:  5 times sit to stand: 12.24 sec 11/15/23  GAIT: Distance walked: 50' Assistive device utilized: Single point cane Level of assistance: Modified independence Comments: slightly antalgic, slightly decreased speed.     TODAY'S TREATMENT:                                                                                                                              DATE:   11/22/2023 Therapeutic Exercise: to improve strength and mobility.  Demo, verbal and tactile cues throughout for technique. Bike L3 x 6 min  MET with resisted hip extension R 5 x 5 sec  hold, L hip flexion 5 x 5 sec hold Sciatic nerve glide R R hamstring stretches  HEP update Manual Therapy: to decrease muscle spasm and pain and improve mobility STM/TPR to R glutes, lumbar paraspinals, pin and stretch to R piriformis, R UPA mobs  lumbar spine, skilled palpation and monitoring during dry needling.  Trigger Point Dry Needling Subsequent Treatment: Instructions provided previously at initial dry needling treatment.  Patient Verbal Consent Given: Yes Education Handout Provided: Previously Provided Muscles Treated: R lumbar paraspinals L4-5, R glut medius, R piriformis Electrical Stimulation Performed: No Treatment Response/Outcome: Twitch Response Elicited and Palpable Increase in Muscle Length  Therapeutic Activity:  assessing low back pain, lumbar AROM, supine to long sit test.       11/19/23 Therapeutic Exercise: to improve strength and mobility.  Demo, verbal and tactile cues throughout for technique. Bike L3 x 6 min Sciatic nerve glides seated x 10  Leg press 20lb 2x10 BLE Knee flexion 20# B con/R ecc x 10; 10lb x 10 RLE Knee extension 10lb x 10 B con/ R ecc; x 10lb RLE Standing lumbar extension x 8  Mod thomas stretch with strap 2x30 R side R hamstring stretch 2x30 with strap Therapeutic Activity: FGA assessed- see under goals 11/15/23 Therapeutic Exercise: to improve strength and mobility.  Demo, verbal and tactile cues throughout for technique. Bike L3 x 6 min  Assessed gait, stairs, 5xSTS, knee AROM for PN Supine sciatic nerve glide 2x10 5xSTS- 12.24 sec  Knee flexion stretch 10x5 on step Knee flexion with strap and peanut ball x 10 Knee flexion 15# x 10 B con/R ecc Knee extension 10# x 10 B con/R ecc  11/12/23 Therapeutic Exercise: to improve strength and mobility.  Demo, verbal and tactile cues throughout for technique. Bike L2 x 6 min  Knee flexion stretch x 10 Runners stretch with ER and extension applied by PT 5 x 10 sec holds Knee extension 15#  1 x 10 BLE, 1 x 10 BLE concentric, RLE eccentric lower Knee flexion 15# 1 x 10 BLE, 1 x 10 BLE concentric, RLE eccentric lower Leg Press 20# 2 x 10, Toe press x 15 Leg stretch on bench   11/08/23 Therapeutic Exercise: to improve strength and mobility.  Demo, verbal and tactile cues throughout for technique. Bike L2 x 6 min full revolutions seat 7 Step ups (2 risers 2 x 12 RLE Side step ups (2 risers) 2 x 12 RLE Step dows (1 riser) x 12 RLE in stance for eccentric strenthening Runners stretch with ER and extension applied by PT 5 x 10 sec holds TKE with towel behind knee x 15 Supine heel slide with strap x 10 Manual Therapy: to decrease muscle spasm and pain and improve mobility Scar tissue mobilization - limited due to sensitivity Patellar mobs - limited due to sensitivity, noted significant edema still present Prone knee flexion stretch -with hip hyperextended to stretch rectus femoris, isometric quad stretch to fatigue and allow deeper stretch, to improve flexion, rotational movements to relax glutes R tib fib mobs    PATIENT EDUCATION:  Education details: HEP review Person educated: Patient Education method: Programmer, Multimedia, Facilities Manager, Verbal cues, and Handouts Education comprehension: verbalized understanding and returned demonstration  HOME EXERCISE PROGRAM: Access Code: [email or web address] URL: [email or web address] Date: 11/19/2023 Prepared by: Braylin Clark  Exercises - Supine Quad Set  - 2 x daily - 7 x weekly - 2 sets - 10 reps - 5-10 sec hold - Supine Short Arc Quad  - 2 x daily - 7 x weekly - 2 sets - 10 reps - Straight Leg Raise  - 2 x daily - 7 x weekly - 2 sets - 10 reps - Supine Heel Slide with Strap  - 2 x daily - 7 x weekly - 2 sets -  10 reps - Mini Squat with Counter Support  - 2 x daily - 7 x weekly - 2 sets - 10 reps - Sit to Stand without Arm Support  - 2 x daily - 7 x weekly - 2 sets - 10 reps - Seated Knee Flexion Stretch  - 2 x daily - 7 x weekly -  2 sets - 10 reps - Seated Hamstring Stretch  - 2 x daily - 7 x weekly - 3 sets - 10 reps - Standing Hip Abduction with Counter Support  - 2 x daily - 7 x weekly - 3 sets - 10 reps - Heel Raises with Counter Support  - 2 x daily - 7 x weekly - 3 sets - 10 reps - Standing Hip Extension with Counter Support  - 2 x daily - 7 x weekly - 3 sets - 10 reps - Gastroc Stretch on Wall  - 2 x daily - 7 x weekly - 3 sets - 10 reps - Seated Knee Extension Stretch with Chair  - 1 x daily - 7 x weekly - 1 sets - 1 reps - 5 min hold - Standing Knee Flexion  - 1 x daily - 7 x weekly - 2-3 sets - 10 reps - [place]  - 1 x daily - 7 x weekly - 2-3 sets - 10 reps - Standing Terminal Knee Extension at [employer] with Ball  - 1 x daily - 7 x weekly - 3 sets - 10 reps - Standing Lumbar Extension  - 1 x daily - 7 x weekly - 2 sets - 5 reps - Seated Sciatic Tensioner  - 1 x daily - 7 x weekly - 2 sets - 10 reps   [email or web address]    ASSESSMENT:  CLINICAL IMPRESSION: Brownie Gockel Coker continues to report increased sciatic nerve pain.  She did not have pain with lumbar AROM although had tightness in R lumbar paraspinals, QL, and glutes, but had positive supine to long sit test for R innominate rotation.  After muscle energy had negative retest, muscle energy also decreased pain and given for HEP.  Manual therapy to glutes and low back also decreased complaint of sciatic nerve pain.  Will reassess next visit, possibly extend POC to focus on low back/SIJ/sciatic as focus of this episode has primarily been on her knee following total knee replacement.  Barnie DASEN Phegley continues to demonstrate potential for improvement and would benefit from continued skilled therapy to address impairments.     OBJECTIVE IMPAIRMENTS: Abnormal gait, decreased activity tolerance, decreased balance, decreased endurance, decreased mobility, difficulty walking, decreased ROM, decreased strength,  increased edema, increased fascial restrictions, increased muscle spasms, impaired flexibility, and pain.   ACTIVITY LIMITATIONS: carrying, lifting, bending, sitting, standing, squatting, sleeping, stairs, transfers, locomotion level, and caring for others   PARTICIPATION LIMITATIONS: meal prep, cleaning, laundry, driving, shopping, community activity, yard work, and leisure activities  PERSONAL FACTORS: Time since onset of injury/illness/exacerbation and 1-2 comorbidities: low back pain, history of L TKA, R ankle pain, HTN  are also affecting patient's functional outcome.   REHAB POTENTIAL: Excellent  CLINICAL DECISION MAKING: Evolving/moderate complexity  EVALUATION COMPLEXITY: Moderate   GOALS: Goals reviewed with patient? Yes   SHORT TERM GOALS: Target date: 11/07/2023   Independent with initial HEP. Baseline:  Goal status: MET 10/23/23 good compliance, independent.   2.  Patient will report 50% improvement in LBP/sciatica symptoms.  Baseline: 4/10 pain, interrupts sleep Goal status: MET 11/15/23 80% improvement   LONG TERM  GOALS: Target date: 11/28/2023   Independent with advanced/ongoing HEP to improve outcomes and carryover.  Baseline:  Goal status: MET 11/22/2023 added MET today  2.  Barnie DASEN Haverland will demonstrate right knee flexion to 120 deg to ascend/descend stairs. Baseline: 90 Goal status: IN PROGRESS 11/15/23 113  3.  Barnie DASEN Kretzschmar will demonstrate full right knee extension for safety with gait. Baseline: lacking 15 Goal status: IN PROGRESS 11/15/23 lacking 3  4.  Barnie DASEN Else will be able to ambulate 600' safely without and normal gait pattern to access community.  Baseline: using SPC Goal status: MET- 11/15/23  5.  Barnie DASEN Marina will be able to ascend/descend stairs with 1 HR and reciprocal step pattern safely to access home and community.  Baseline: step to gait Goal status: MET- 11/15/23  6.  Barnie DASEN Liszewski will demonstrate > 20/30 on FGA to  demonstrate decreased risk of falls.   Baseline: NT Goal status: MET- 11/19/23 25 / 30 = 83.3 %  7.  Barnie DASEN Arnall will demonstrate improved functional LE strength by completing 5x STS in <14 seconds.  Baseline: NT Goal status: MET- 11/15/23  8.  Barnie DASEN Hopkinson will report < 18/45 on Modified Oswestry to demonstrate improved quality of life.  Baseline: 24/45 on Modified Oswestry Goal status: IN PROGRESS  9.  Barnie DASEN Agramonte will report 75% improvement in LBP/sciatica symptoms.  Baseline:  Goal status: MET- 11/15/23 80%     PLAN:  PT FREQUENCY: 2-3x/week  PT DURATION: 6 weeks  PLANNED INTERVENTIONS: 97164- PT Re-evaluation, 97110-Therapeutic exercises, 97530- Therapeutic activity, 97112- Neuromuscular re-education, 97535- Self Care, [id number]- Manual therapy, [id number]- Gait training, [phone]- Orthotic Fit/training, 97014- Electrical stimulation (unattended), [id number]- Electrical stimulation (manual), 97016- Vasopneumatic device, [id number]- Ultrasound, 02987- Traction (mechanical), Patient/Family education, Balance training, Stair training, Taping, Dry Needling, Joint mobilization, Joint manipulation, Spinal manipulation, Spinal mobilization, Scar mobilization, Cryotherapy, and Moist heat  PLAN FOR NEXT SESSION: may need to reassess low back; review and progress HEP for knee, progress LE strength, balance and ROM.  Measure ROM weekly. Start gym equipment, has membership at sports center.  Almarie JINNY Sprinkles, PT 11/22/2023 9:54 AM

## 2023-11-27 ENCOUNTER — Ambulatory Visit: Payer: Medicare Other | Admitting: Physical Therapy

## 2023-11-27 ENCOUNTER — Encounter: Payer: Self-pay | Admitting: Physical Therapy

## 2023-11-27 DIAGNOSIS — M25571 Pain in right ankle and joints of right foot: Secondary | ICD-10-CM | POA: Diagnosis not present

## 2023-11-27 DIAGNOSIS — M25561 Pain in right knee: Secondary | ICD-10-CM | POA: Diagnosis not present

## 2023-11-27 DIAGNOSIS — M6281 Muscle weakness (generalized): Secondary | ICD-10-CM

## 2023-11-27 DIAGNOSIS — M5441 Lumbago with sciatica, right side: Secondary | ICD-10-CM | POA: Diagnosis not present

## 2023-11-27 DIAGNOSIS — R600 Localized edema: Secondary | ICD-10-CM | POA: Diagnosis not present

## 2023-11-27 DIAGNOSIS — M25661 Stiffness of right knee, not elsewhere classified: Secondary | ICD-10-CM | POA: Diagnosis not present

## 2023-11-27 NOTE — Therapy (Signed)
 OUTPATIENT PHYSICAL THERAPY LOWER EXTREMITY TREATMENT/RECERT   Patient Name: Kristin Benjamin MRN: [id number] DOB:[birth year], 68 y.o., female Today's Date: 11/27/2023  END OF SESSION:  PT End of Session - 11/27/23 0935     Visit Number 13    Date for PT Re-Evaluation 12/18/23    Authorization Type [employer]    Progress Note Due on Visit 10    PT Start Time 0935    PT Stop Time 1020    PT Time Calculation (min) 45 min    Activity Tolerance Patient tolerated treatment well    Behavior During Therapy [facility] for tasks assessed/performed                   Past Medical History:  Diagnosis Date   Allergy    Anxiety    Arthritis    Depression    Headache    history of   Heart murmur    in the past   Hx of adenomatous colonic polyps 07/17/2017   Hyperlipidemia    Hypertension    Past Surgical History:  Procedure Laterality Date   bladder tack     COLONOSCOPY  2006   and 2018   FUNCTIONAL ENDOSCOPIC SINUS SURGERY     JOINT REPLACEMENT     Left total knee arthroplasty Dr. Vernetta 10-18-18   KNEE ARTHROSCOPY W/ MENISCAL REPAIR Right 08/2016   TOTAL KNEE ARTHROPLASTY Left 10/18/2018   Procedure: LEFT TOTAL KNEE ARTHROPLASTY;  Surgeon: Vernetta Lonni GRADE, MD;  Location: [facility];  Service: Orthopedics;  Laterality: Left;   TOTAL KNEE ARTHROPLASTY Right 09/28/2023   Procedure: RIGHT TOTAL KNEE ARTHROPLASTY;  Surgeon: Vernetta Lonni GRADE, MD;  Location: [facility];  Service: Orthopedics;  Laterality: Right;   TUBAL LIGATION  1993   Patient Active Problem List   Diagnosis Date Noted   Status post total right knee replacement 09/28/2023   Frequent UTI 06/25/2023   Coronary artery disease involving native coronary artery of native heart without angina pectoris 05/21/2023   Aortic atherosclerosis (HCC) 05/21/2023   Acute cystitis without hematuria 04/17/2023   High risk medication use 12/18/2022   Attention deficit hyperactivity disorder (ADHD) 05/18/2022   Mixed  hyperlipidemia 04/27/2022   Primary hypertension 04/27/2022   Anxiety and depression 04/27/2022   Pes anserinus bursitis of right knee 02/16/2020   Status post total left knee replacement 10/18/2018   Chronic pain of right knee 03/28/2018   Hx of adenomatous colonic polyps 07/17/2017    PCP: [name] NOVAK, NP   REFERRING PROVIDER: Vernetta Lonni GRADE*   REFERRING DIAG: F45.68,F45.67 (ICD-10-CM) - Bilateral sciatica   THERAPY DIAG:  Muscle weakness (generalized) - Plan: PT plan of care cert/re-cert  Acute right-sided low back pain with right-sided sciatica - Plan: PT plan of care cert/re-cert  Rationale for Evaluation and Treatment: Rehabilitation  ONSET DATE: s/p R TKR on 09/28/2023  SUBJECTIVE:   SUBJECTIVE STATEMENT: Started SIJ exercises, those helped, able to sleep in bed last couple of nights. Left VM asking about MRI with Dr. Vernetta.   NEXT MD VISIT: none scheduled.    PERTINENT HISTORY: L TKA 2019, R ankle pain, HTN, low back pain PAIN:  Are you having pain? Yes: NPRS scale: 0/10 Pain location: R knee Pain description: ache Aggravating factors: bending Relieving factors: ice  Are you having pain? Yes: NPRS scale: 1-2/10 Pain location: Low back/sciatic Pain description: toothache, stiff  Aggravating factors: bending knee, overuse  Relieving factors: ice, movement   PRECAUTIONS: None  RED  FLAGS: None   WEIGHT BEARING RESTRICTIONS: No  FALLS:  Has patient fallen in last 6 months? No  LIVING ENVIRONMENT: Lives with: lives with their spouse Lives in: House/apartment Stairs: Yes: Internal: 14 steps; on left going up and External: 2 steps; none Has following equipment at home: Single point cane and Walker - 2 wheeled  OCCUPATION: retired  PLOF: Independent and Leisure: pickleball, swim, walk  PATIENT GOALS: get back to walking, pickleball, and activities   OBJECTIVE:  Note: Objective measures were completed at Evaluation unless otherwise  noted.  DIAGNOSTIC FINDINGS: DG R knee 09/28/23 IMPRESSION: Well-positioned right total knee arthroplasty.  No imaging of low back  PATIENT SURVEYS:  Modified Oswestry 24/45   COGNITION: Overall cognitive status: Within functional limits for tasks assessed     SENSATION: WFL  EDEMA:  Circumferential: 45cm R, 41.5 cm L knee  MUSCLE LENGTH: NT  POSTURE: No Significant postural limitations  PALPATION: Tenderness R glutes/piriformis. Incision covered with steristrips.  No tenderness in calf.  No signs of infection.   LUMBAR ROM:   NT today due to R knee pain and poor tolerance to standing.   Active  A/PROM  eval  Flexion   Extension   Right lateral flexion   Left lateral flexion   Right rotation   Left rotation    (Blank rows = not tested)    LOWER EXTREMITY ROM:  Active ROM Right eval Left eval Right 10/23/23 Right 11/08/23 R 11/15/23  Knee flexion 90 130 103 110 113  Knee extension Lacking 15 0 Lacking 8 Lacking 4  3    (Blank rows = not tested)  LOWER EXTREMITY MMT:  MMT Right eval Left eval  Hip flexion 3 5  Hip extension    Hip abduction    Hip adduction    Knee flexion 3+ 5  Knee extension 3+ 5  Ankle dorsiflexion 5 5  Ankle plantarflexion     (Blank rows = not tested)  LUMBAR SPECIAL TESTS:  Slump test: Negative   FUNCTIONAL TESTS:  5 times sit to stand: 12.24 sec 11/15/23  GAIT: Distance walked: 50' Assistive device utilized: Single point cane Level of assistance: Modified independence Comments: slightly antalgic, slightly decreased speed.     TODAY'S TREATMENT:                                                                                                                              DATE:   11/27/2023 Therapeutic Exercise: to improve strength and mobility.  Demo, verbal and tactile cues throughout for technique. Bike L3 x 8 min Elephant walks Review HEP Windshield Wipers Therapeutic Activity:  further assessment of SIJ and  LBP Manual Therapy: to decrease muscle spasm and pain and improve mobility STM/TPR to R glutes, piriformis, percussive device to R glutes/piriformis  11/22/2023 Therapeutic Exercise: to improve strength and mobility.  Demo, verbal and tactile cues throughout for technique. Bike L3 x 6 min  MET with resisted  hip extension R 5 x 5 sec hold, L hip flexion 5 x 5 sec hold Sciatic nerve glide R R hamstring stretches  HEP update Manual Therapy: to decrease muscle spasm and pain and improve mobility STM/TPR to R glutes, lumbar paraspinals, pin and stretch to R piriformis, R UPA mobs lumbar spine, skilled palpation and monitoring during dry needling.  Trigger Point Dry Needling Subsequent Treatment: Instructions provided previously at initial dry needling treatment.  Patient Verbal Consent Given: Yes Education Handout Provided: Previously Provided Muscles Treated: R lumbar paraspinals L4-5, R glut medius, R piriformis Electrical Stimulation Performed: No Treatment Response/Outcome: Twitch Response Elicited and Palpable Increase in Muscle Length  Therapeutic Activity:  assessing low back pain, lumbar AROM, supine to long sit test.       11/19/23 Therapeutic Exercise: to improve strength and mobility.  Demo, verbal and tactile cues throughout for technique. Bike L3 x 6 min Sciatic nerve glides seated x 10  Leg press 20lb 2x10 BLE Knee flexion 20# B con/R ecc x 10; 10lb x 10 RLE Knee extension 10lb x 10 B con/ R ecc; x 10lb RLE Standing lumbar extension x 8  Mod thomas stretch with strap 2x30 R side R hamstring stretch 2x30 with strap Therapeutic Activity: FGA assessed- see under goals 11/15/23 Therapeutic Exercise: to improve strength and mobility.  Demo, verbal and tactile cues throughout for technique. Bike L3 x 6 min  Assessed gait, stairs, 5xSTS, knee AROM for PN Supine sciatic nerve glide 2x10 5xSTS- 12.24 sec  Knee flexion stretch 10x5 on step Knee flexion with strap and peanut  ball x 10 Knee flexion 15# x 10 B con/R ecc Knee extension 10# x 10 B con/R ecc   PATIENT EDUCATION:  Education details: HEP update Person educated: Patient Education method: Explanation, Demonstration, Verbal cues, and Handouts Education comprehension: verbalized understanding and returned demonstration  HOME EXERCISE PROGRAM: Access Code: [email or web address] URL: [email or web address] Date: 11/19/2023 Prepared by: Braylin Clark  Exercises - Supine Quad Set  - 2 x daily - 7 x weekly - 2 sets - 10 reps - 5-10 sec hold - Supine Short Arc Quad  - 2 x daily - 7 x weekly - 2 sets - 10 reps - Straight Leg Raise  - 2 x daily - 7 x weekly - 2 sets - 10 reps - Supine Heel Slide with Strap  - 2 x daily - 7 x weekly - 2 sets - 10 reps - Mini Squat with Counter Support  - 2 x daily - 7 x weekly - 2 sets - 10 reps - Sit to Stand without Arm Support  - 2 x daily - 7 x weekly - 2 sets - 10 reps - Seated Knee Flexion Stretch  - 2 x daily - 7 x weekly - 2 sets - 10 reps - Seated Hamstring Stretch  - 2 x daily - 7 x weekly - 3 sets - 10 reps - Standing Hip Abduction with Counter Support  - 2 x daily - 7 x weekly - 3 sets - 10 reps - Heel Raises with Counter Support  - 2 x daily - 7 x weekly - 3 sets - 10 reps - Standing Hip Extension with Counter Support  - 2 x daily - 7 x weekly - 3 sets - 10 reps - Gastroc Stretch on Wall  - 2 x daily - 7 x weekly - 3 sets - 10 reps - Seated Knee Extension Stretch with Chair  - 1 x daily - 7 x  weekly - 1 sets - 1 reps - 5 min hold - Standing Knee Flexion  - 1 x daily - 7 x weekly - 2-3 sets - 10 reps - [place]  - 1 x daily - 7 x weekly - 2-3 sets - 10 reps - Standing Terminal Knee Extension at [employer] with Ball  - 1 x daily - 7 x weekly - 3 sets - 10 reps - Standing Lumbar Extension  - 1 x daily - 7 x weekly - 2 sets - 5 reps - Seated Sciatic Tensioner  - 1 x daily - 7 x weekly - 2 sets - 10  reps   [email or web address]     ASSESSMENT:  CLINICAL IMPRESSION: CHELBIE JARNAGIN is still reporting LBP, but improved from last visit.  She has  no pain with PA mobs to low back, pain is still localized to R SIJ/glutes/piriformis region.  Noted tightness in L hip, no concerns in R hip.  Negative SLR.  Negative supine to long sit test, but she has been consistently performing MET with decreased symptoms.  Noted that she is having difficulty with hamstring stretches for R knee aggravating her sciatic symptoms.  Given new exercises today for hamstring and hip mobility which she tolerated well without pain.  Since she continues to have R side sciatic symptoms interfering with sleep and QOL, recommend extending POC for additional 3 weeks 1-2x/week to focus on hip mobility, core/hip strengthening, and decreasing sciatic pain to improve QOL, as majority of episode has been focused on rehabing after R TKA.   Barnie ONEIDA Melnik continues to demonstrate potential for improvement and would benefit from continued skilled therapy to address impairments.     OBJECTIVE IMPAIRMENTS: Abnormal gait, decreased activity tolerance, decreased balance, decreased endurance, decreased mobility, difficulty walking, decreased ROM, decreased strength, increased edema, increased fascial restrictions, increased muscle spasms, impaired flexibility, and pain.   ACTIVITY LIMITATIONS: carrying, lifting, bending, sitting, standing, squatting, sleeping, stairs, transfers, locomotion level, and caring for others   PARTICIPATION LIMITATIONS: meal prep, cleaning, laundry, driving, shopping, community activity, yard work, and leisure activities  PERSONAL FACTORS: Time since onset of injury/illness/exacerbation and 1-2 comorbidities: low back pain, history of L TKA, R ankle pain, HTN  are also affecting patient's functional outcome.   REHAB POTENTIAL: Excellent  CLINICAL DECISION MAKING:  Evolving/moderate complexity  EVALUATION COMPLEXITY: Moderate   GOALS: Goals reviewed with patient? Yes   SHORT TERM GOALS: Target date: 11/07/2023   Independent with initial HEP. Baseline:  Goal status: MET 10/23/23 good compliance, independent.   2.  Patient will report 50% improvement in LBP/sciatica symptoms.  Baseline: 4/10 pain, interrupts sleep Goal status: MET 11/15/23 80% improvement   LONG TERM GOALS: Target date: 11/28/2023 extended to 12/18/2023  Independent with advanced/ongoing HEP to improve outcomes and carryover.  Baseline:  Goal status: MET 11/22/2023 added MET today  2.  Barnie ONEIDA Ellsworth will demonstrate right knee flexion to 120 deg to ascend/descend stairs. Baseline: 90 Goal status: IN PROGRESS 11/15/23 113  3.  Barnie ONEIDA Bouffard will demonstrate full right knee extension for safety with gait. Baseline: lacking 15 Goal status: IN PROGRESS 11/15/23 lacking 3  4.  Barnie ONEIDA Abplanalp will be able to ambulate 600' safely without and normal gait pattern to access community.  Baseline: using SPC Goal status: MET- 11/15/23  5.  Barnie ONEIDA Alles will be able to ascend/descend stairs with 1 HR and reciprocal step pattern safely to access home and community.  Baseline: step to gait  Goal status: MET- 11/15/23  6.  Barnie DASEN Klahr will demonstrate > 20/30 on FGA to demonstrate decreased risk of falls.   Baseline: NT Goal status: MET- 11/19/23 25 / 30 = 83.3 %  7.  Barnie DASEN Dagostino will demonstrate improved functional LE strength by completing 5x STS in <14 seconds.  Baseline: NT Goal status: MET- 11/15/23  8.  Barnie DASEN Scull will report < 18/45 (40%) on Modified Oswestry to demonstrate improved quality of life.  Baseline: 24/45 53% on Modified Oswestry Goal status: IN PROGRESS 11/27/23 22/50 44%  9.  Barnie DASEN Loden will report 75% improvement in LBP/sciatica symptoms.  Baseline:  Goal status: MET- 11/15/23 80%    10.  Barnie DASEN Render will be able to sleep without  interruption from R sciatic pain x 1 week.  Baseline: constant sleep interruptions due to pain.  Goal status: INITIAL    PLAN:  PT FREQUENCY: 1-2x/week  PT DURATION: 3 weeks  PLANNED INTERVENTIONS: 97164- PT Re-evaluation, 97110-Therapeutic exercises, 97530- Therapeutic activity, 97112- Neuromuscular re-education, 97535- Self Care, [id number]- Manual therapy, [id number]- Gait training, [phone]- Orthotic Fit/training, 97014- Electrical stimulation (unattended), [id number]- Electrical stimulation (manual), [id number]- Vasopneumatic device, [id number]- Ultrasound, [id number]- Traction (mechanical), Patient/Family education, Balance training, Stair training, Taping, Dry Needling, Joint mobilization, Joint manipulation, Spinal manipulation, Spinal mobilization, Scar mobilization, Cryotherapy, and Moist heat  PLAN FOR NEXT SESSION: focus on R sciatic, hip mobility, core strengthening, modalities PRN, manual therapy to include TrDN as needed.   Almarie JINNY Sprinkles, PT, DPT 11/27/2023 3:47 PM

## 2023-12-03 ENCOUNTER — Ambulatory Visit: Payer: Medicare Other

## 2023-12-03 DIAGNOSIS — R600 Localized edema: Secondary | ICD-10-CM | POA: Diagnosis not present

## 2023-12-03 DIAGNOSIS — M6281 Muscle weakness (generalized): Secondary | ICD-10-CM

## 2023-12-03 DIAGNOSIS — M25561 Pain in right knee: Secondary | ICD-10-CM

## 2023-12-03 DIAGNOSIS — M5441 Lumbago with sciatica, right side: Secondary | ICD-10-CM | POA: Diagnosis not present

## 2023-12-03 DIAGNOSIS — M25571 Pain in right ankle and joints of right foot: Secondary | ICD-10-CM

## 2023-12-03 DIAGNOSIS — M25661 Stiffness of right knee, not elsewhere classified: Secondary | ICD-10-CM

## 2023-12-03 NOTE — Therapy (Signed)
OUTPATIENT PHYSICAL THERAPY LOWER EXTREMITY TREATMENT   Patient Name: VANASSA SCHLINK MRN: [id number] DOB:07-22-1956, 68 y.o., female Today's Date: 12/03/2023  END OF SESSION:  PT End of Session - 12/03/23 1143     Visit Number 14    Date for PT Re-Evaluation 12/18/23    Authorization Type [employer]    Progress Note Due on Visit 10    PT Start Time 1102    PT Stop Time 1141    PT Time Calculation (min) 39 min    Activity Tolerance Patient tolerated treatment well    Behavior During Therapy WFL for tasks assessed/performed                    Past Medical History:  Diagnosis Date   Allergy    Anxiety    Arthritis    Depression    Headache    history of   Heart murmur    in the past   Hx of adenomatous colonic polyps 07/17/2017   Hyperlipidemia    Hypertension    Past Surgical History:  Procedure Laterality Date   bladder tack     COLONOSCOPY  2006   and 2018   FUNCTIONAL ENDOSCOPIC SINUS SURGERY     JOINT REPLACEMENT     Left total knee arthroplasty Dr. Magnus Ivan 10-18-18   KNEE ARTHROSCOPY W/ MENISCAL REPAIR Right 08/2016   TOTAL KNEE ARTHROPLASTY Left 10/18/2018   Procedure: LEFT TOTAL KNEE ARTHROPLASTY;  Surgeon: Kathryne Hitch, MD;  Location: [facility];  Service: Orthopedics;  Laterality: Left;   TOTAL KNEE ARTHROPLASTY Right 09/28/2023   Procedure: RIGHT TOTAL KNEE ARTHROPLASTY;  Surgeon: Kathryne Hitch, MD;  Location: [facility];  Service: Orthopedics;  Laterality: Right;   TUBAL LIGATION  1993   Patient Active Problem List   Diagnosis Date Noted   Status post total right knee replacement 09/28/2023   Frequent UTI 06/25/2023   Coronary artery disease involving native coronary artery of native heart without angina pectoris 05/21/2023   Aortic atherosclerosis (HCC) 05/21/2023   Acute cystitis without hematuria 04/17/2023   High risk medication use 12/18/2022   Attention deficit hyperactivity disorder (ADHD) 05/18/2022   Mixed  hyperlipidemia 04/27/2022   Primary hypertension 04/27/2022   Anxiety and depression 04/27/2022   Pes anserinus bursitis of right knee 02/16/2020   Status post total left knee replacement 10/18/2018   Chronic pain of right knee 03/28/2018   Hx of adenomatous colonic polyps 07/17/2017    PCP: Clayborne Dana, NP   REFERRING PROVIDER: Kathryne Hitch*   REFERRING DIAG: N82.95,A21.30 (ICD-10-CM) - Bilateral sciatica   THERAPY DIAG:  Muscle weakness (generalized)  Acute right-sided low back pain with right-sided sciatica  Acute pain of right knee  Stiffness of right knee, not elsewhere classified  Localized edema  Pain in right ankle and joints of right foot  Rationale for Evaluation and Treatment: Rehabilitation  ONSET DATE: s/p R TKR on 09/28/2023  SUBJECTIVE:   SUBJECTIVE STATEMENT: SIJ exercises helped initially    NEXT MD VISIT: none scheduled.    PERTINENT HISTORY: L TKA 2019, R ankle pain, HTN, low back pain PAIN:  Are you having pain? Yes: NPRS scale: 0/10 Pain location: R knee Pain description: ache Aggravating factors: bending Relieving factors: ice  Are you having pain? Yes: NPRS scale: 1-2/10 Pain location: Low back/sciatic Pain description: toothache, stiff  Aggravating factors: bending knee, overuse  Relieving factors: ice, movement   PRECAUTIONS: None  RED FLAGS: None  WEIGHT BEARING RESTRICTIONS: No  FALLS:  Has patient fallen in last 6 months? No  LIVING ENVIRONMENT: Lives with: lives with their spouse Lives in: House/apartment Stairs: Yes: Internal: 14 steps; on left going up and External: 2 steps; none Has following equipment at home: Single point cane and Walker - 2 wheeled  OCCUPATION: retired  PLOF: Independent and Leisure: pickleball, swim, walk  PATIENT GOALS: get back to walking, pickleball, and activities   OBJECTIVE:  Note: Objective measures were completed at Evaluation unless otherwise noted.  DIAGNOSTIC  FINDINGS: DG R knee 09/28/23 IMPRESSION: Well-positioned right total knee arthroplasty.  No imaging of low back  PATIENT SURVEYS:  Modified Oswestry 24/45   COGNITION: Overall cognitive status: Within functional limits for tasks assessed     SENSATION: WFL  EDEMA:  Circumferential: 45cm R, 41.5 cm L knee  MUSCLE LENGTH: NT  POSTURE: No Significant postural limitations  PALPATION: Tenderness R glutes/piriformis. Incision covered with steristrips.  No tenderness in calf.  No signs of infection.   LUMBAR ROM:   NT today due to R knee pain and poor tolerance to standing.   Active  A/PROM  eval  Flexion   Extension   Right lateral flexion   Left lateral flexion   Right rotation   Left rotation    (Blank rows = not tested)    LOWER EXTREMITY ROM:  Active ROM Right eval Left eval Right 10/23/23 Right 11/08/23 R 11/15/23  Knee flexion 90 130 103 110 113  Knee extension Lacking 15 0 Lacking 8 Lacking 4  3    (Blank rows = not tested)  LOWER EXTREMITY MMT:  MMT Right eval Left eval  Hip flexion 3 5  Hip extension    Hip abduction    Hip adduction    Knee flexion 3+ 5  Knee extension 3+ 5  Ankle dorsiflexion 5 5  Ankle plantarflexion     (Blank rows = not tested)  LUMBAR SPECIAL TESTS:  Slump test: Negative   FUNCTIONAL TESTS:  5 times sit to stand: 12.24 sec 11/15/23  GAIT: Distance walked: 50' Assistive device utilized: Single point cane Level of assistance: Modified independence Comments: slightly antalgic, slightly decreased speed.     TODAY'S TREATMENT:                                                                                                                              DATE:  12/03/23 Therapeutic Exercise: to improve strength and mobility.  Demo, verbal and tactile cues throughout for technique. Bike L3 x 6 min Standing pelvic rotation on mat table x 15 B Passive hip flexor stretch s/l 2x30" Seated pigeon pose 2 x 30 " B S/l  clamshells RTB x 15 B Manual Therapy: to decrease muscle spasm and pain and improve mobility Massage gun to bil glutes, piriformis, hip rotators  11/27/2023 Therapeutic Exercise: to improve strength and mobility.  Demo, verbal and tactile cues throughout for technique. Bike L3  x 8 min Elephant walks Review HEP Windshield Wipers Therapeutic Activity:  further assessment of SIJ and LBP Manual Therapy: to decrease muscle spasm and pain and improve mobility STM/TPR to R glutes, piriformis, percussive device to R glutes/piriformis  11/22/2023 Therapeutic Exercise: to improve strength and mobility.  Demo, verbal and tactile cues throughout for technique. Bike L3 x 6 min  MET with resisted hip extension R 5 x 5 sec hold, L hip flexion 5 x 5 sec hold Sciatic nerve glide R R hamstring stretches  HEP update Manual Therapy: to decrease muscle spasm and pain and improve mobility STM/TPR to R glutes, lumbar paraspinals, pin and stretch to R piriformis, R UPA mobs lumbar spine, skilled palpation and monitoring during dry needling.  Trigger Point Dry Needling Subsequent Treatment: Instructions provided previously at initial dry needling treatment.  Patient Verbal Consent Given: Yes Education Handout Provided: Previously Provided Muscles Treated: R lumbar paraspinals L4-5, R glut medius, R piriformis Electrical Stimulation Performed: No Treatment Response/Outcome: Twitch Response Elicited and Palpable Increase in Muscle Length  Therapeutic Activity:  assessing low back pain, lumbar AROM, supine to long sit test.       11/19/23 Therapeutic Exercise: to improve strength and mobility.  Demo, verbal and tactile cues throughout for technique. Bike L3 x 6 min Sciatic nerve glides seated x 10  Leg press 20lb 2x10 BLE Knee flexion 20# B con/R ecc x 10; 10lb x 10 RLE Knee extension 10lb x 10 B con/ R ecc; x 10lb RLE Standing lumbar extension x 8  Mod thomas stretch with strap 2x30" R side R hamstring  stretch 2x30" with strap Therapeutic Activity: FGA assessed- see under goals 11/15/23 Therapeutic Exercise: to improve strength and mobility.  Demo, verbal and tactile cues throughout for technique. Bike L3 x 6 min  Assessed gait, stairs, 5xSTS, knee AROM for PN Supine sciatic nerve glide 2x10 5xSTS- 12.24 sec  Knee flexion stretch 10x5" on step Knee flexion with strap and peanut ball x 10 Knee flexion 15# x 10 B con/R ecc Knee extension 10# x 10 B con/R ecc   PATIENT EDUCATION:  Education details: HEP update Person educated: Patient Education method: Explanation, Demonstration, Verbal cues, and Handouts Education comprehension: verbalized understanding and returned demonstration  HOME EXERCISE PROGRAM: Access Code: [email or web address] URL: [email or web address] Date: 11/19/2023 Prepared by: Verta Ellen  Exercises - Supine Quad Set  - 2 x daily - 7 x weekly - 2 sets - 10 reps - 5-10 sec hold - Supine Short Arc Quad  - 2 x daily - 7 x weekly - 2 sets - 10 reps - Straight Leg Raise  - 2 x daily - 7 x weekly - 2 sets - 10 reps - Supine Heel Slide with Strap  - 2 x daily - 7 x weekly - 2 sets - 10 reps - Mini Squat with Counter Support  - 2 x daily - 7 x weekly - 2 sets - 10 reps - Sit to Stand without Arm Support  - 2 x daily - 7 x weekly - 2 sets - 10 reps - Seated Knee Flexion Stretch  - 2 x daily - 7 x weekly - 2 sets - 10 reps - Seated Hamstring Stretch  - 2 x daily - 7 x weekly - 3 sets - 10 reps - Standing Hip Abduction with Counter Support  - 2 x daily - 7 x weekly - 3 sets - 10 reps - Heel Raises with Counter Support  - 2 x daily -  7 x weekly - 3 sets - 10 reps - Standing Hip Extension with Counter Support  - 2 x daily - 7 x weekly - 3 sets - 10 reps - Gastroc Stretch on Wall  - 2 x daily - 7 x weekly - 3 sets - 10 reps - Seated Knee Extension Stretch with Chair  - 1 x daily - 7 x weekly - 1 sets - 1 reps - 5 min hold - Standing Knee Flexion  - 1 x daily - 7 x weekly -  2-3 sets - 10 reps - [place]  - 1 x daily - 7 x weekly - 2-3 sets - 10 reps - Standing Terminal Knee Extension at [employer] with Ball  - 1 x daily - 7 x weekly - 3 sets - 10 reps - Standing Lumbar Extension  - 1 x daily - 7 x weekly - 2 sets - 5 reps - Seated Sciatic Tensioner  - 1 x daily - 7 x weekly - 2 sets - 10 reps   [email or web address]     ASSESSMENT:  CLINICAL IMPRESSION: Pt showed a good demonstration and response to interventions today. She reported improved pain so added some of the exercises to her HEP. She had some trigger points in her L glutes with using the massage gun along with more tightness with the hip flexor stretch. [place] Malachi continues to demonstrate potential for improvement and would benefit from continued skilled therapy to address impairments.     OBJECTIVE IMPAIRMENTS: Abnormal gait, decreased activity tolerance, decreased balance, decreased endurance, decreased mobility, difficulty walking, decreased ROM, decreased strength, increased edema, increased fascial restrictions, increased muscle spasms, impaired flexibility, and pain.   ACTIVITY LIMITATIONS: carrying, lifting, bending, sitting, standing, squatting, sleeping, stairs, transfers, locomotion level, and caring for others   PARTICIPATION LIMITATIONS: meal prep, cleaning, laundry, driving, shopping, community activity, yard work, and leisure activities  PERSONAL FACTORS: Time since onset of injury/illness/exacerbation and 1-2 comorbidities: low back pain, history of L TKA, R ankle pain, HTN  are also affecting patient's functional outcome.   REHAB POTENTIAL: Excellent  CLINICAL DECISION MAKING: Evolving/moderate complexity  EVALUATION COMPLEXITY: Moderate   GOALS: Goals reviewed with patient? Yes   SHORT TERM GOALS: Target date: 11/07/2023   Independent with initial HEP. Baseline:  Goal status: MET 10/23/23 good compliance, independent.   2.   Patient will report 50% improvement in LBP/sciatica symptoms.  Baseline: 4/10 pain, interrupts sleep Goal status: MET 11/15/23 80% improvement   LONG TERM GOALS: Target date: 11/28/2023 extended to 12/18/2023  Independent with advanced/ongoing HEP to improve outcomes and carryover.  Baseline:  Goal status: MET 11/22/2023 added MET today  2.  R[place]ka [place] Varnell will demonstrate right knee flexion to 120 deg to ascend/descend stairs. Baseline: 90 Goal status: IN PROGRESS 11/15/23 113  3.  Rivka Spring [place] will demonstrate full right knee extension for safety with gait. Baseline: lacking 15 Goal status: IN PROGRESS 11/15/23 lacking 3  4.  Rivka Spring Baylock will be able to ambulate 600' safely without and normal gait pattern to access community.  Baseline: using SPC Goal status: MET- 11/15/23  5.  Rivka Spring Thrush will be able to ascend/descend stairs with 1 HR and reciprocal step pattern safely to access home and community.  Baseline: step to gait Goal status: MET- 11/15/23  6.  Rivka Spring Runkle will demonstrate > 20/30 on FGA to demonstrate decreased risk of falls.   Baseline: NT Goal status: MET- 11/19/23 25 / 30 = 83.3 %  7.  Rivka Spring Ferrentino will demonstrate improved functional LE strength by completing 5x STS in <14 seconds.  Baseline: NT Goal status: MET- 11/15/23  8.  Rivka Spring Calkin will report < 18/45 (40%) on Modified Oswestry to demonstrate improved quality of life.  Baseline: 24/45 53% on Modified Oswestry Goal status: IN PROGRESS 11/27/23 22/50 44%  9.  Rivka Spring Sekula will report 75% improvement in LBP/sciatica symptoms.  Baseline:  Goal status: MET- 11/15/23 80%    10.  Rivka Spring Templin will be able to sleep without interruption from R sciatic pain x 1 week.  Baseline: constant sleep interruptions due to pain.  Goal status: INITIAL    PLAN:  PT FREQUENCY: 1-2x/week  PT DURATION: 3 weeks  PLANNED INTERVENTIONS: 97164- PT Re-evaluation, 97110-Therapeutic exercises, 97530-  Therapeutic activity, 97112- Neuromuscular re-education, 97535- Self Care, 08657- Manual therapy, [id number]- Gait training, [phone]- Orthotic Fit/training, 97014- Electrical stimulation (unattended), [id number]- Electrical stimulation (manual), [id number]- Vasopneumatic device, [id number]- Ultrasound, [id number]- Traction (mechanical), Patient/Family education, Balance training, Stair training, Taping, Dry Needling, Joint mobilization, Joint manipulation, Spinal manipulation, Spinal mobilization, Scar mobilization, Cryotherapy, and Moist heat  PLAN FOR NEXT SESSION: focus on R sciatic, hip mobility, core strengthening, modalities PRN, manual therapy to include TrDN as needed.   Darleene Cleaver, PTA 12/03/2023 11:43 AM

## 2023-12-05 DIAGNOSIS — R040 Epistaxis: Secondary | ICD-10-CM | POA: Diagnosis not present

## 2023-12-06 DIAGNOSIS — K080 Exfoliation of teeth due to systemic causes: Secondary | ICD-10-CM | POA: Diagnosis not present

## 2023-12-11 DIAGNOSIS — K080 Exfoliation of teeth due to systemic causes: Secondary | ICD-10-CM | POA: Diagnosis not present

## 2023-12-13 ENCOUNTER — Ambulatory Visit: Payer: Medicare Other | Admitting: Physical Therapy

## 2023-12-13 ENCOUNTER — Encounter: Payer: Self-pay | Admitting: Physical Therapy

## 2023-12-13 DIAGNOSIS — M5441 Lumbago with sciatica, right side: Secondary | ICD-10-CM

## 2023-12-13 DIAGNOSIS — R600 Localized edema: Secondary | ICD-10-CM

## 2023-12-13 DIAGNOSIS — M6281 Muscle weakness (generalized): Secondary | ICD-10-CM | POA: Diagnosis not present

## 2023-12-13 DIAGNOSIS — M25571 Pain in right ankle and joints of right foot: Secondary | ICD-10-CM | POA: Diagnosis not present

## 2023-12-13 DIAGNOSIS — M25561 Pain in right knee: Secondary | ICD-10-CM

## 2023-12-13 DIAGNOSIS — M25661 Stiffness of right knee, not elsewhere classified: Secondary | ICD-10-CM

## 2023-12-13 NOTE — Therapy (Signed)
OUTPATIENT PHYSICAL THERAPY LOWER EXTREMITY TREATMENT   Patient Name: Kristin Benjamin MRN: [id number] DOB:[birth year], 68 y.o., female Today's Date: 12/13/2023  END OF SESSION:  PT End of Session - 12/13/23 0935     Visit Number 15    Date for PT Re-Evaluation 12/18/23    Authorization Type [employer]    Progress Note Due on Visit 10    PT Start Time 0933    PT Stop Time 1015    PT Time Calculation (min) 42 min    Activity Tolerance Patient tolerated treatment well    Behavior During Therapy [facility] for tasks assessed/performed                    Past Medical History:  Diagnosis Date   Allergy    Anxiety    Arthritis    Depression    Headache    history of   Heart murmur    in the past   Hx of adenomatous colonic polyps 07/17/2017   Hyperlipidemia    Hypertension    Past Surgical History:  Procedure Laterality Date   bladder tack     COLONOSCOPY  2006   and 2018   FUNCTIONAL ENDOSCOPIC SINUS SURGERY     JOINT REPLACEMENT     Left total knee arthroplasty Dr. Magnus Ivan 10-18-18   KNEE ARTHROSCOPY W/ MENISCAL REPAIR Right 08/2016   TOTAL KNEE ARTHROPLASTY Left 10/18/2018   Procedure: LEFT TOTAL KNEE ARTHROPLASTY;  Surgeon: Kathryne Hitch, MD;  Location: [facility];  Service: Orthopedics;  Laterality: Left;   TOTAL KNEE ARTHROPLASTY Right 09/28/2023   Procedure: RIGHT TOTAL KNEE ARTHROPLASTY;  Surgeon: Kathryne Hitch, MD;  Location: [facility];  Service: Orthopedics;  Laterality: Right;   TUBAL LIGATION  1993   Patient Active Problem List   Diagnosis Date Noted   Status post total right knee replacement 09/28/2023   Frequent UTI 06/25/2023   Coronary artery disease involving native coronary artery of native heart without angina pectoris 05/21/2023   Aortic atherosclerosis (HCC) 05/21/2023   Acute cystitis without hematuria 04/17/2023   High risk medication use 12/18/2022   Attention deficit hyperactivity disorder (ADHD) 05/18/2022   Mixed  hyperlipidemia 04/27/2022   Primary hypertension 04/27/2022   Anxiety and depression 04/27/2022   Pes anserinus bursitis of right knee 02/16/2020   Status post total left knee replacement 10/18/2018   Chronic pain of right knee 03/28/2018   Hx of adenomatous colonic polyps 07/17/2017    PCP: Clayborne Dana, NP   REFERRING PROVIDER: Kathryne Hitch*   REFERRING DIAG: X52.84,X32.44 (ICD-10-CM) - Bilateral sciatica   THERAPY DIAG:  Muscle weakness (generalized)  Acute right-sided low back pain with right-sided sciatica  Acute pain of right knee  Stiffness of right knee, not elsewhere classified  Localized edema  Rationale for Evaluation and Treatment: Rehabilitation  ONSET DATE: s/p R TKR on 09/28/2023  SUBJECTIVE:   SUBJECTIVE STATEMENT: Reports she does well for 4-5 days, then it flares up, then she does the stretches and it feels better.   Has MRI scheduled and then will see Dr. Magnus Ivan again to read results and discuss next steps.   NEXT MD VISIT: none scheduled.    PERTINENT HISTORY: L TKA 2019, R ankle pain, HTN, low back pain PAIN:  Are you having pain? Yes: NPRS scale: 0/10 Pain location: R knee Pain description: ache Aggravating factors: bending Relieving factors: ice  Are you having pain? Yes: NPRS scale: 1-2/10 Pain location: Low back/sciatic  Pain description: toothache, stiff  Aggravating factors: bending knee, overuse  Relieving factors: ice, movement   PRECAUTIONS: None  RED FLAGS: None   WEIGHT BEARING RESTRICTIONS: No  FALLS:  Has patient fallen in last 6 months? No  LIVING ENVIRONMENT: Lives with: lives with their spouse Lives in: House/apartment Stairs: Yes: Internal: 14 steps; on left going up and External: 2 steps; none Has following equipment at home: Single point cane and Walker - 2 wheeled  OCCUPATION: retired  PLOF: Independent and Leisure: pickleball, swim, walk  PATIENT GOALS: get back to walking, pickleball, and  activities   OBJECTIVE:  Note: Objective measures were completed at Evaluation unless otherwise noted.  DIAGNOSTIC FINDINGS: DG R knee 09/28/23 IMPRESSION: Well-positioned right total knee arthroplasty.  No imaging of low back  PATIENT SURVEYS:  Modified Oswestry 24/45   COGNITION: Overall cognitive status: Within functional limits for tasks assessed     SENSATION: WFL  EDEMA:  Circumferential: 45cm R, 41.5 cm L knee  MUSCLE LENGTH: NT  POSTURE: No Significant postural limitations  PALPATION: Tenderness R glutes/piriformis. Incision covered with steristrips.  No tenderness in calf.  No signs of infection.   LUMBAR ROM:   NT today due to R knee pain and poor tolerance to standing.   Active  A/PROM  eval  Flexion   Extension   Right lateral flexion   Left lateral flexion   Right rotation   Left rotation    (Blank rows = not tested)    LOWER EXTREMITY ROM:  Active ROM Right eval Left eval Right 10/23/23 Right 11/08/23 R 11/15/23 12/13/23 12/13/23  Knee flexion 90 130 103 110 113 120  Knee extension Lacking 15 0 Lacking 8 Lacking 4  3  4    (Blank rows = not tested)  LOWER EXTREMITY MMT:  MMT Right eval Left eval  Hip flexion 3 5  Hip extension    Hip abduction    Hip adduction    Knee flexion 3+ 5  Knee extension 3+ 5  Ankle dorsiflexion 5 5  Ankle plantarflexion     (Blank rows = not tested)  LUMBAR SPECIAL TESTS:  Slump test: Negative   FUNCTIONAL TESTS:  5 times sit to stand: 12.24 sec 11/15/23  GAIT: Distance walked: 50' Assistive device utilized: Single point cane Level of assistance: Modified independence Comments: slightly antalgic, slightly decreased speed.     TODAY'S TREATMENT:                                                                                                                              DATE:   12/13/23 Therapeutic Exercise: to improve strength and mobility.  Demo, verbal and tactile cues throughout for  technique. Bike L3 x 6 min  Step and bend for SIJ Quadruped lift off (knees on pillow) x 10  Clock exercise x 5 each side for LE strengthening/balance/hip ROM Bridges 2 x 10  HS stretch Manual Therapy: to decrease muscle spasm  and pain and improve mobility Patellar mobs Reflex release to patellar and hamstring tendons to improve extension Mobs to tib/fib joint   12/03/23 Therapeutic Exercise: to improve strength and mobility.  Demo, verbal and tactile cues throughout for technique. Bike L3 x 6 min Standing pelvic rotation on mat table x 15 B Passive hip flexor stretch s/l 2x30" Seated pigeon pose 2 x 30 " B S/l clamshells RTB x 15 B Manual Therapy: to decrease muscle spasm and pain and improve mobility Massage gun to bil glutes, piriformis, hip rotators  11/27/2023 Therapeutic Exercise: to improve strength and mobility.  Demo, verbal and tactile cues throughout for technique. Bike L3 x 8 min Elephant walks Review HEP Pharmacist, hospital Activity:  further assessment of SIJ and LBP Manual Therapy: to decrease muscle spasm and pain and improve mobility STM/TPR to R glutes, piriformis, percussive device to R glutes/piriformis  PATIENT EDUCATION:  Education details: HEP update Person educated: Patient Education method: Programmer, multimedia, Demonstration, Verbal cues, and Handouts Education comprehension: verbalized understanding and returned demonstration  HOME EXERCISE PROGRAM: Access Code: [email or web address] URL: [email or web address] Date: 12/13/2023 Prepared by: Harrie Foreman  Exercises - Straight Leg Raise  - 2 x daily - 7 x weekly - 2 sets - 10 reps - Supine Heel Slide with Strap  - 2 x daily - 7 x weekly - 2 sets - 10 reps - Mini Squat with Counter Support  - 2 x daily - 7 x weekly - 2 sets - 10 reps - Sit to Stand without Arm Support  - 2 x daily - 7 x weekly - 2 sets - 10 reps - Seated Knee Flexion Stretch  - 2 x daily - 7 x weekly - 2 sets - 10 reps -  Seated Hamstring Stretch  - 2 x daily - 7 x weekly - 3 sets - 10 reps - Standing Hip Abduction with Counter Support  - 2 x daily - 7 x weekly - 3 sets - 10 reps - Heel Raises with Counter Support  - 2 x daily - 7 x weekly - 3 sets - 10 reps - Standing Hip Extension with Counter Support  - 2 x daily - 7 x weekly - 3 sets - 10 reps - Gastroc Stretch on Wall  - 2 x daily - 7 x weekly - 3 sets - 10 reps - Seated Knee Extension Stretch with Chair  - 1 x daily - 7 x weekly - 1 sets - 1 reps - 5 min hold - Standing Knee Flexion  - 1 x daily - 7 x weekly - 2-3 sets - 10 reps - [place]  - 1 x daily - 7 x weekly - 2-3 sets - 10 reps - Standing Terminal Knee Extension at [employer] with Ball  - 1 x daily - 7 x weekly - 3 sets - 10 reps - Standing Lumbar Extension  - 1 x daily - 7 x weekly - 2 sets - 5 reps - Seated Sciatic Tensioner  - 1 x daily - 7 x weekly - 2 sets - 10 reps - Clamshell with Resistance  - 1 x daily - 7 x weekly - 3 sets - 10 reps - Seated Table Piriformis Stretch  - 1 x daily - 7 x weekly - 3 sets - 10 reps - Single Leg Balance with Clock Reach  - 1 x daily - 7 x weekly - 1 sets - 10 reps - Quadruped Yoga Block Lift Off  - 1 x daily -  7 x weekly - 1-2 sets - 10 reps - Supine Bridge  - 1 x daily - 7 x weekly - 2-3 sets - 10 reps   [email or web address]     ASSESSMENT:  CLINICAL IMPRESSION: MAKYLA BYE is making good progress.  Still has tightness in hamstrings but flexion is improving, measured 4-120 after manual therapy today.  Her SIJ pain is manageable with PT stretches, she has MRI scheduled as well.  Progressed HEP to continue strengthening, improve balance, and help with SIJ pain.  Rivka Spring Monts continues to demonstrate potential for improvement and would benefit from continued skilled therapy to address impairments.     OBJECTIVE IMPAIRMENTS: Abnormal gait, decreased activity tolerance, decreased balance, decreased  endurance, decreased mobility, difficulty walking, decreased ROM, decreased strength, increased edema, increased fascial restrictions, increased muscle spasms, impaired flexibility, and pain.   ACTIVITY LIMITATIONS: carrying, lifting, bending, sitting, standing, squatting, sleeping, stairs, transfers, locomotion level, and caring for others   PARTICIPATION LIMITATIONS: meal prep, cleaning, laundry, driving, shopping, community activity, yard work, and leisure activities  PERSONAL FACTORS: Time since onset of injury/illness/exacerbation and 1-2 comorbidities: low back pain, history of L TKA, R ankle pain, HTN  are also affecting patient's functional outcome.   REHAB POTENTIAL: Excellent  CLINICAL DECISION MAKING: Evolving/moderate complexity  EVALUATION COMPLEXITY: Moderate   GOALS: Goals reviewed with patient? Yes   SHORT TERM GOALS: Target date: 11/07/2023   Independent with initial HEP. Baseline:  Goal status: MET 10/23/23 good compliance, independent.   2.  Patient will report 50% improvement in LBP/sciatica symptoms.  Baseline: 4/10 pain, interrupts sleep Goal status: MET 11/15/23 80% improvement   LONG TERM GOALS: Target date: 11/28/2023 extended to 12/18/2023  Independent with advanced/ongoing HEP to improve outcomes and carryover.  Baseline:  Goal status: MET 11/22/2023 added MET today  2.  Rivka Spring Njoku will demonstrate right knee flexion to 120 deg to ascend/descend stairs. Baseline: 90 Goal status: MET 11/15/23 113 12/13/23 120  3.  Rivka Spring Bolender will demonstrate full right knee extension for safety with gait. Baseline: lacking 15 Goal status: IN PROGRESS 11/15/23 lacking 3 12/13/23 lacking 3-4 degrees  4.  Rivka Spring Braaten will be able to ambulate 600' safely without and normal gait pattern to access community.  Baseline: using SPC Goal status: MET- 11/15/23  5.  Rivka Spring Moorman will be able to ascend/descend stairs with 1 HR and reciprocal step pattern safely to  access home and community.  Baseline: step to gait Goal status: MET- 11/15/23  6.  Rivka Spring Lamontagne will demonstrate > 20/30 on FGA to demonstrate decreased risk of falls.   Baseline: NT Goal status: MET- 11/19/23 25 / 30 = 83.3 %  7.  Rivka Spring Lardizabal will demonstrate improved functional LE strength by completing 5x STS in <14 seconds.  Baseline: NT Goal status: MET- 11/15/23  8.  Rivka Spring Kram will report < 18/45 (40%) on Modified Oswestry to demonstrate improved quality of life.  Baseline: 24/45 53% on Modified Oswestry Goal status: IN PROGRESS 11/27/23 22/50 44%  9.  Rivka Spring Sinn will report 75% improvement in LBP/sciatica symptoms.  Baseline:  Goal status: MET- 11/15/23 80%    10.  Rivka Spring Koral will be able to sleep without interruption from R sciatic pain x 1 week.  Baseline: constant sleep interruptions due to pain.  Goal status: IN PROGRESS 12/13/23 - can sleep now with pillow under thighs    PLAN:  PT FREQUENCY: 1-2x/week  PT DURATION: 3  weeks  PLANNED INTERVENTIONS: 97164- PT Re-evaluation, 97110-Therapeutic exercises, 97530- Therapeutic activity, 97112- Neuromuscular re-education, 97535- Self Care, [id number]- Manual therapy, [id number]- Gait training, [phone]- Orthotic Fit/training, 97014- Electrical stimulation (unattended), [id number]- Electrical stimulation (manual), [id number]- Vasopneumatic device, [id number]- Ultrasound, [id number]- Traction (mechanical), Patient/Family education, Balance training, Stair training, Taping, Dry Needling, Joint mobilization, Joint manipulation, Spinal manipulation, Spinal mobilization, Scar mobilization, Cryotherapy, and Moist heat  PLAN FOR NEXT SESSION: 30 day hold.   Jena Gauss, PT 12/13/2023 1:28 PM

## 2023-12-15 ENCOUNTER — Ambulatory Visit
Admission: RE | Admit: 2023-12-15 | Discharge: 2023-12-15 | Disposition: A | Discharge status: Home / Self Care | Payer: Medicare Other | Source: Ambulatory Visit | Attending: Orthopaedic Surgery | Admitting: Orthopaedic Surgery

## 2023-12-15 DIAGNOSIS — M47816 Spondylosis without myelopathy or radiculopathy, lumbar region: Secondary | ICD-10-CM | POA: Diagnosis not present

## 2023-12-15 DIAGNOSIS — M5431 Sciatica, right side: Secondary | ICD-10-CM

## 2023-12-18 ENCOUNTER — Encounter: Payer: Medicare Other | Admitting: Physical Therapy

## 2023-12-24 ENCOUNTER — Encounter: Payer: Self-pay | Admitting: Orthopaedic Surgery

## 2023-12-25 ENCOUNTER — Ambulatory Visit (INDEPENDENT_AMBULATORY_CARE_PROVIDER_SITE_OTHER): Payer: Medicare Other | Admitting: Family Medicine

## 2023-12-25 ENCOUNTER — Encounter: Payer: Self-pay | Admitting: Family Medicine

## 2023-12-25 VITALS — BP 117/73 | HR 92 | Ht 68.0 in | Wt 180.0 lb

## 2023-12-25 DIAGNOSIS — J309 Allergic rhinitis, unspecified: Secondary | ICD-10-CM | POA: Insufficient documentation

## 2023-12-25 DIAGNOSIS — F909 Attention-deficit hyperactivity disorder, unspecified type: Secondary | ICD-10-CM

## 2023-12-25 DIAGNOSIS — G4700 Insomnia, unspecified: Secondary | ICD-10-CM | POA: Diagnosis not present

## 2023-12-25 DIAGNOSIS — Z79899 Other long term (current) drug therapy: Secondary | ICD-10-CM

## 2023-12-25 MED ORDER — TRAZODONE HCL 50 MG PO TABS
25.0000 mg | ORAL_TABLET | Freq: Every evening | ORAL | 3 refills | Status: DC | PRN
Start: 2023-12-25 — End: 2024-09-17

## 2023-12-25 MED ORDER — FLUTICASONE PROPIONATE 50 MCG/ACT NA SUSP: 2.0000 | Freq: Every day | NASAL | 3 refills | Status: AC

## 2023-12-25 NOTE — Assessment & Plan Note (Signed)
Previous nosebleeds, now resolved after cauterization. Patient reports some relief with Flonase. -Add Flonase to maintenance medications at [employer]. Advise patient to use cautiously, especially on side previously affected by nosebleeds.

## 2023-12-25 NOTE — Assessment & Plan Note (Signed)
Patient reports waking up 3-4 nights per week, with 2 nights per week resulting in prolonged wakefulness. No associated anxiety or stress. -Trial of as-needed Trazodone for sleep. Start with half tablet and adjust as needed.

## 2023-12-25 NOTE — Assessment & Plan Note (Signed)
Patient has been off Adderall and reports managing well without it. -Remove Adderall from medication list.

## 2023-12-25 NOTE — Progress Notes (Signed)
Established Patient Office Visit  Subjective   Patient ID: Kristin Benjamin, female    DOB: 09-18-1956  Age: 68 y.o. MRN: [id number]  Chief Complaint  Patient presents with   Medical Management of Chronic Issues    HPI   Discussed the use of AI scribe software for clinical note transcription with the patient, who gave verbal consent to proceed.  History of Present Illness   Kristin Benjamin is a 68 year old female who presents for a follow-up visit.  She experiences ongoing sleep disturbances, waking up a few nights a week. She uses melatonin to help return to sleep, but if unsuccessful, she gets up to watch TV, which occurs about twice a week. No anxiety or stress is associated with her sleep disturbances. She will feel tired the next day if she doesn't get a good night's sleep. Previous medications for sleep, including hydroxyzine and Sonata, were initially effective but ceased to work after a few uses. She has not filled these prescriptions recently.  She underwent knee replacement surgery twelve weeks ago and reports that her knee is doing well, although it remains swollen. She has completed physical therapy for her knee and is now attending physical therapy for back pain, which she suspects is related to her sacroiliac joint. She is also going to the gym regularly.  She has a history of epistaxis, which was not resolved by routine treatment from her ENT. The epistaxis was successfully treated with cauterization six weeks ago. She manages her allergies with a generic form of Zyrtec every other day and has used Flonase in the past, which she finds helpful for her allergies. She is cautious about using Flonase due to concerns about it potentially causing epistaxis.  She mentions a change in her insurance to Medicare, which previously delayed her Adderall prescription. However, she has decided to discontinue Adderall as she feels she no longer needs it.           12/25/2023    9:56  AM 09/18/2023    8:17 AM 03/19/2023    8:52 AM  PHQ9 SCORE ONLY  PHQ-9 Total Score 3 0 4      12/25/2023    9:57 AM 03/19/2023    8:52 AM  GAD 7 : Generalized Anxiety Score  Nervous, Anxious, on Edge 0 0  Control/stop worrying 0 0  Worry too much - different things 0 0  Trouble relaxing 0 0  Restless 0 0  Easily annoyed or irritable 0 1  Afraid - awful might happen 0 1  Total GAD 7 Score 0 2  Anxiety Difficulty Not difficult at all Not difficult at all         ROS All review of systems negative except what is listed in the HPI    Objective:     BP 117/73   Pulse 92   Ht 5\' 8"  (1.727 m)   Wt 180 lb (81.6 kg)   SpO2 94%   BMI 27.37 kg/m    Physical Exam Vitals reviewed.  Constitutional:      Appearance: Normal appearance.  Cardiovascular:     Rate and Rhythm: Normal rate and regular rhythm.     Heart sounds: Normal heart sounds.  Pulmonary:     Effort: Pulmonary effort is normal.     Breath sounds: Normal breath sounds.  Skin:    General: Skin is warm and dry.  Neurological:     Mental Status: She is alert and oriented to person,  place, and time.  Psychiatric:        Mood and Affect: Mood normal.        Behavior: Behavior normal.        Thought Content: Thought content normal.        Judgment: Judgment normal.          No results found for any visits on 12/25/23.    The 10-year ASCVD risk score (Arnett DK, et al., 2019) is: 12.8%    Assessment & Plan:   Problem List Items Addressed This Visit       Active Problems   Attention deficit hyperactivity disorder (ADHD) - Primary (Chronic)   Patient has been off Adderall and reports managing well without it. -Remove Adderall from medication list.      Insomnia   Patient reports waking up 3-4 nights per week, with 2 nights per week resulting in prolonged wakefulness. No associated anxiety or stress. -Trial of as-needed Trazodone for sleep. Start with half tablet and adjust as needed.       Relevant Medications   traZODone (DESYREL) 50 MG tablet   Allergic rhinitis   Previous nosebleeds, now resolved after cauterization. Patient reports some relief with Flonase. -Add Flonase to maintenance medications at [employer]. Advise patient to use cautiously, especially on side previously affected by nosebleeds.       Relevant Medications   fluticasone (FLONASE) 50 MCG/ACT nasal spray      Return in about 6 months (around 06/23/2024) for routine follow-up.    Clayborne Dana, NP

## 2023-12-26 ENCOUNTER — Other Ambulatory Visit: Payer: Self-pay | Admitting: Radiology

## 2023-12-26 ENCOUNTER — Ambulatory Visit: Payer: Medicare Other | Admitting: Orthopaedic Surgery

## 2023-12-26 ENCOUNTER — Encounter: Payer: Self-pay | Admitting: Orthopaedic Surgery

## 2023-12-26 DIAGNOSIS — M5431 Sciatica, right side: Secondary | ICD-10-CM

## 2023-12-26 DIAGNOSIS — M5432 Sciatica, left side: Secondary | ICD-10-CM

## 2023-12-26 NOTE — Progress Notes (Signed)
The patient is a 68 year old female well-known to me.  She is now getting close to 3 months out from a right total knee replacement.  We replaced her left knee several years ago.  Her knees are doing great.  She is actually coming in today to go over a lumbar spine MRI.  She been having significant right-sided low back pain that was radiated into the ischium and the sciatic region.  She is continue to have the same symptoms.  On exam she does seem to have some irritation with a straight leg raise on the right side there is no weakness at all and a right total knee is doing well.  She has good mobility of her lumbar spine.  The MRI of her lumbar spine does show multifactorial degenerative changes at L4-L5 and L5-S1 there is moderate in terms of some right sided foraminal stenosis at those levels.  I talked to her in length about what this means and showed her a spine model.  I would like to actually send her to Dr. Alvester Morin for a right sided epidural steroid injection at L4-L5 versus L5-S1 depending on what his thoughts are from her MRI and clinical exam.  He can then get her back to [place] about a month later after the injection so I can still see her in follow-up.  She agrees with this treatment plan.

## 2024-01-03 ENCOUNTER — Ambulatory Visit: Payer: Medicare Other | Admitting: Physical Medicine and Rehabilitation

## 2024-01-03 ENCOUNTER — Other Ambulatory Visit: Payer: Self-pay

## 2024-01-03 VITALS — BP 107/72 | HR 78

## 2024-01-03 DIAGNOSIS — M5416 Radiculopathy, lumbar region: Secondary | ICD-10-CM | POA: Diagnosis not present

## 2024-01-03 MED ORDER — METHYLPREDNISOLONE ACETATE 40 MG/ML IJ SUSP
40.0000 mg | Freq: Once | INTRAMUSCULAR | Status: AC
  Administered 2024-01-03: 40 mg

## 2024-01-03 NOTE — Progress Notes (Signed)
Pain Score---2 No Thinners No allergies to contrast

## 2024-01-03 NOTE — Patient Instructions (Signed)

## 2024-01-03 NOTE — Progress Notes (Signed)
 Kristin Benjamin - 68 y.o. female MRN [id number]  Date of birth: [birth year]  Office Visit Note: Visit Date: 01/03/2024 PCP: Clayborne Dana, NP Referred by: Clayborne Dana, NP  Subjective: Chief Complaint  Patient presents with   Lower Back - Pain   HPI:  Kristin Benjamin is a 68 y.o. female who comes in today at the request of Dr. Doneen Poisson for planned Right L5-S1 Lumbar Interlaminar epidural steroid injection with fluoroscopic guidance.  The patient has failed conservative care including home exercise, medications, time and activity modification.  This injection will be diagnostic and hopefully therapeutic.  Please see requesting physician notes for further details and justification.  Consider diagnostic facet blocks.   ROS Otherwise per HPI.  Assessment & Plan: Visit Diagnoses:    ICD-10-CM   1. Lumbar radiculopathy  M54.16 XR C-ARM NO REPORT    Epidural Steroid injection    methylPREDNISolone acetate (DEPO-MEDROL) injection 40 mg      Plan: No additional findings.   Meds & Orders:  Meds ordered this encounter  Medications   methylPREDNISolone acetate (DEPO-MEDROL) injection 40 mg    Orders Placed This Encounter  Procedures   XR C-ARM NO REPORT   Epidural Steroid injection    Follow-up: Return for visit to requesting provider as needed.   Procedures: No procedures performed  Lumbar Epidural Steroid Injection - Interlaminar Approach with Fluoroscopic Guidance  Patient: Kristin Benjamin      Date of Birth: [birth year] MRN: [id number] PCP: Clayborne Dana, NP      Visit Date: 01/03/2024   Universal Protocol:     Consent Given By: the patient  Position: PRONE  Additional Comments: Vital signs were monitored before and after the procedure. Patient was prepped and draped in the usual sterile fashion. The correct patient, procedure, and site was verified.   Injection Procedure Details:   Procedure diagnoses: Lumbar radiculopathy [M54.16]   Meds  Administered:  Meds ordered this encounter  Medications   methylPREDNISolone acetate (DEPO-MEDROL) injection 40 mg     Laterality: Right  Location/Site:  L5-S1  Needle: 3.5 in., 20 ga. Tuohy  Needle Placement: Paramedian epidural  Findings:   -Comments: Excellent flow of contrast into the epidural space.  Procedure Details: Using a paramedian approach from the side mentioned above, the region overlying the inferior lamina was localized under fluoroscopic visualization and the soft tissues overlying this structure were infiltrated with 4 ml. of 1% Lidocaine without Epinephrine. The Tuohy needle was inserted into the epidural space using a paramedian approach.   The epidural space was localized using loss of resistance along with counter oblique bi-planar fluoroscopic views.  After negative aspirate for air, blood, and CSF, a 2 ml. volume of Isovue-250 was injected into the epidural space and the flow of contrast was observed. Radiographs were obtained for documentation purposes.    The injectate was administered into the level noted above.   Additional Comments:  No complications occurred Dressing: 2 x 2 sterile gauze and Band-Aid    Post-procedure details: Patient was observed during the procedure. Post-procedure instructions were reviewed.  Patient left the clinic in stable condition.   Clinical History: MRI LUMBAR SPINE WITHOUT CONTRAST   TECHNIQUE: Multiplanar, multisequence MR imaging of the lumbar spine was performed. No intravenous contrast was administered.   COMPARISON:  None Available.   FINDINGS: Segmentation:  Standard.   Alignment:  Physiologic.   Vertebrae:  No fracture, evidence of discitis, or bone lesion.   Conus medullaris  and cauda equina: Conus extends to the L1 level. Conus and cauda equina appear normal.   Paraspinal and other soft tissues: Negative.   Disc levels:   T12-L1: Unremarkable   L1-L2: Mild left-sided facet degenerative  change. Minimal disc bulge. No significant spinal canal narrowing. Mild left neural foraminal narrowing.   L2-L3: Mild bilateral facet degenerative change. No significant disc bulge. No spinal canal narrowing. Mild right neural foraminal narrowing.   L3-L4: Moderate left and mild right facet degenerative change. Circumferential disc bulge. Ligamentum flavum hypertrophy. Mild overall spinal canal narrowing. Mild bilateral neural foraminal narrowing.   L4-L5: Moderate bilateral facet degenerative change. Circumferential disc bulge. Ligamentum flavum hypertrophy. Mild spinal canal narrowing. Mild narrowing of the bilateral lateral recesses moderate right and mild left neural foraminal narrowing.   L5-S1: Moderate right and mild left facet degenerative change. Minimal disc bulge. No significant spinal canal narrowing. Moderate right and mild left neural foraminal narrowing.   IMPRESSION: 1. Multilevel degenerative changes without evidence of high-grade spinal canal stenosis. 2. Lower lumbar spine predominant facet degenerative change with moderate right-sided neural foraminal narrowing at L4-L5 and L5-S1.     Electronically Signed   By: Lorenza Cambridge M.D.   On: 12/25/2023 11:06     Objective:  VS:  HT:    WT:   BMI:     BP:107/72  HR:78bpm  TEMP: ( )  RESP:  Physical Exam Vitals and nursing note reviewed.  Constitutional:      General: She is not in acute distress.    Appearance: Normal appearance. She is not ill-appearing.  HENT:     Head: Normocephalic and atraumatic.     Right Ear: External ear normal.     Left Ear: External ear normal.  Eyes:     Extraocular Movements: Extraocular movements intact.  Cardiovascular:     Rate and Rhythm: Normal rate.     Pulses: Normal pulses.  Pulmonary:     Effort: Pulmonary effort is normal. No respiratory distress.  Abdominal:     General: There is no distension.     Palpations: Abdomen is soft.  Musculoskeletal:         General: Tenderness present.     Cervical back: Neck supple.     Right lower leg: No edema.     Left lower leg: No edema.     Comments: Patient has good distal strength with no pain over the greater trochanters.  No clonus or focal weakness.  Skin:    Findings: No erythema, lesion or rash.  Neurological:     General: No focal deficit present.     Mental Status: She is alert and oriented to person, place, and time.     Sensory: No sensory deficit.     Motor: No weakness or abnormal muscle tone.     Coordination: Coordination normal.  Psychiatric:        Mood and Affect: Mood normal.        Behavior: Behavior normal.      Imaging: No results found.

## 2024-01-08 NOTE — Procedures (Signed)
 Lumbar Epidural Steroid Injection - Interlaminar Approach with Fluoroscopic Guidance  Patient: Kristin Benjamin      Date of Birth: 12-07-1955 MRN: [id number] PCP: Clayborne Dana, NP      Visit Date: 01/03/2024   Universal Protocol:     Consent Given By: the patient  Position: PRONE  Additional Comments: Vital signs were monitored before and after the procedure. Patient was prepped and draped in the usual sterile fashion. The correct patient, procedure, and site was verified.   Injection Procedure Details:   Procedure diagnoses: Lumbar radiculopathy [M54.16]   Meds Administered:  Meds ordered this encounter  Medications   methylPREDNISolone acetate (DEPO-MEDROL) injection 40 mg     Laterality: Right  Location/Site:  L5-S1  Needle: 3.5 in., 20 ga. Tuohy  Needle Placement: Paramedian epidural  Findings:   -Comments: Excellent flow of contrast into the epidural space.  Procedure Details: Using a paramedian approach from the side mentioned above, the region overlying the inferior lamina was localized under fluoroscopic visualization and the soft tissues overlying this structure were infiltrated with 4 ml. of 1% Lidocaine without Epinephrine. The Tuohy needle was inserted into the epidural space using a paramedian approach.   The epidural space was localized using loss of resistance along with counter oblique bi-planar fluoroscopic views.  After negative aspirate for air, blood, and CSF, a 2 ml. volume of Isovue-250 was injected into the epidural space and the flow of contrast was observed. Radiographs were obtained for documentation purposes.    The injectate was administered into the level noted above.   Additional Comments:  No complications occurred Dressing: 2 x 2 sterile gauze and Band-Aid    Post-procedure details: Patient was observed during the procedure. Post-procedure instructions were reviewed.  Patient left the clinic in stable condition.

## 2024-01-14 ENCOUNTER — Other Ambulatory Visit: Payer: Self-pay | Admitting: Family Medicine

## 2024-01-14 DIAGNOSIS — F32A Depression, unspecified: Secondary | ICD-10-CM

## 2024-01-21 DIAGNOSIS — H5203 Hypermetropia, bilateral: Secondary | ICD-10-CM | POA: Diagnosis not present

## 2024-01-31 DIAGNOSIS — K080 Exfoliation of teeth due to systemic causes: Secondary | ICD-10-CM | POA: Diagnosis not present

## 2024-03-17 ENCOUNTER — Encounter: Payer: Self-pay | Admitting: Orthopaedic Surgery

## 2024-03-18 ENCOUNTER — Other Ambulatory Visit: Payer: Self-pay

## 2024-03-18 DIAGNOSIS — M25571 Pain in right ankle and joints of right foot: Secondary | ICD-10-CM

## 2024-03-18 NOTE — Telephone Encounter (Signed)
 Entered into the referral and closed it out.

## 2024-03-19 ENCOUNTER — Other Ambulatory Visit: Payer: Self-pay | Admitting: Acute Care

## 2024-03-19 DIAGNOSIS — F17210 Nicotine dependence, cigarettes, uncomplicated: Secondary | ICD-10-CM

## 2024-03-19 DIAGNOSIS — Z122 Encounter for screening for malignant neoplasm of respiratory organs: Secondary | ICD-10-CM

## 2024-03-19 DIAGNOSIS — Z87891 Personal history of nicotine dependence: Secondary | ICD-10-CM

## 2024-03-20 ENCOUNTER — Ambulatory Visit (INDEPENDENT_AMBULATORY_CARE_PROVIDER_SITE_OTHER)

## 2024-03-20 ENCOUNTER — Encounter: Payer: Self-pay | Admitting: Podiatry

## 2024-03-20 ENCOUNTER — Ambulatory Visit: Admitting: Podiatry

## 2024-03-20 DIAGNOSIS — M778 Other enthesopathies, not elsewhere classified: Secondary | ICD-10-CM | POA: Diagnosis not present

## 2024-03-20 DIAGNOSIS — M7751 Other enthesopathy of right foot: Secondary | ICD-10-CM | POA: Diagnosis not present

## 2024-03-20 DIAGNOSIS — M205X1 Other deformities of toe(s) (acquired), right foot: Secondary | ICD-10-CM | POA: Diagnosis not present

## 2024-03-20 MED ORDER — MELOXICAM 15 MG PO TABS: 15.0000 mg | ORAL_TABLET | Freq: Every day | ORAL | 0 refills | Status: DC | PRN

## 2024-03-20 NOTE — Progress Notes (Signed)
 Orthotics   Patient was present and evaluated for Custom molded foot orthotics. Patient will benefit from CFO's to provide total contact to BIL MLA's helping to balance and distribute body weight more evenly across BIL feet helping to reduce plantar pressure and pain. Orthotic will also encourage FF / RF alignment  Patient has HX of BIL TKA last one, Right knee was replaced in November of 2024 edema still present and LLD is visibly noticeable I measured and Right LE is almost 1/2" longer than left I made 1/4" left heel lift and added to her shoe today. Patient was very happy with fit and feel of Heel lift   Patient was scanned today and will return for fitting upon receipt  Financial form signed  Britton Cane Cped, CFo, CFm

## 2024-03-20 NOTE — Progress Notes (Signed)
 Subjective:  Patient ID: Kristin Benjamin, female    DOB: [birth year],  MRN: [id number]  Chief Complaint  Patient presents with   Foot Pain    RM#12 Pinky toe and right side of foot pain limping ongoing for a few weeks. Icing and resting foot with no improvement.    Discussed the use of AI scribe software for clinical note transcription with the patient, who gave verbal consent to proceed.  History of Present Illness Kristin Benjamin is a 68 year old female who presents with pain in the fifth toe.  She experiences pain in her right fifth toe, described as feeling 'broken' and tender, affecting her ability to walk comfortably. The pain is located in the toe and the side of the foot pointing to the fifth metatarsal base. Activities such as pickleball, hiking, and chasing her grandchildren exacerbate the discomfort.  She has not sustained any injuries to the foot and uses RICE therapy for management. Over-the-counter orthopedic inserts have been tried to help redistribute weight due to her gait, which may be related to her previous knee replacements.  She has not been taking any specific medications for the toe pain but is open to using anti-inflammatories if needed.      Objective:    Physical Exam General: AAO x3, NAD  Dermatological: Skin is warm, dry and supple bilateral.  There are no open sores, no preulcerative lesions, no rash or signs of infection present.  Vascular: Dorsalis Pedis artery and Posterior Tibial artery pedal pulses are 2/4 bilateral with immedate capillary fill time.  There is no pain with calf compression, swelling, warmth, erythema.   Neruologic: Grossly intact via light touch bilateral.   Musculoskeletal: Adductovarus present right fifth toe with hammertoe deformities also present.  There is tenderness palpation of the PIPJ along the lateral aspect the right fifth toe.  There is no open lesions.  Minimal edema.  She also gets discomfort on the fifth  metatarsal base on the right foot as well as the midfoot laterally.  There is no area pinpoint tenderness.  She describes that she walks more with pronation type symptoms.  Gait: Unassisted, Nonantalgic.     No images are attached to the encounter.    Results RADIOLOGY Right foot X-ray: Digital x-rays present.  There is no evidence of acute fracture noted today.   Assessment:   Capsulitis right foot, adductovarus right fifth toe   Plan:  Patient was evaluated and treated and all questions answered.  Assessment and Plan Assessment & Plan Hammer toe of right fifth toe Chronic hammer toe with joint inflammation and pain. Non-surgical options include pressure relief and anti-inflammatory medication.  - Provide pads and cushions for pressure relief. - Prescribe meloxicam  for inflammation. - She would like to proceed with surgery given ongoing discomfort. - The incision placement as well as the postoperative course was discussed with the patient. I discussed risks of the surgery which include, but not limited to, infection, bleeding, pain, swelling, need for further surgery, delayed or nonhealing, painful or ugly scar, numbness or sensation changes, over/under correction, recurrence, transfer lesions, further deformity, DVT/PE, loss of toe/foot. Patient understands these risks and wishes to proceed with surgery. The surgical consent was reviewed with the patient all 3 pages were signed. No promises or guarantees were given to the outcome of the procedure. All questions were answered to the best of my ability. Before the surgery the patient was encouraged to call the office if there is any further questions. The  surgery will be performed at the [facility] on an outpatient basis.    Fifth metatarsal base pain, capsulitis Lateral foot pain due to peroneal tendonitis, likely exacerbated by gait and foot structure. Non-surgical management includes anti-inflammatory medication, proper footwear,  exercises, and custom shoe inserts. - Prescribe meloxicam  for inflammation. - Recommend proper footwear with good arch support. - Refer to pedorthist for custom shoe inserts. - Provide exercises for peroneal tendon strengthening. - Advise icing after activities.    No follow-ups on file.   Charity Conch DPM

## 2024-03-20 NOTE — Patient Instructions (Addendum)
 Pre-Operative Instructions  Congratulations, you have decided to take an important step to improving your quality of life.  You can be assured that the doctors of [facility] will be with you every step of the way.  Plan to be at the surgery center/hospital at least 1 (one) hour prior to your scheduled time unless otherwise directed by the surgical center/hospital staff.  You must have a responsible adult accompany you, remain during the surgery and drive you home.  Make sure you have directions to the surgical center/hospital and know how to get there on time. For hospital based surgery you will need to obtain a history and physical form from your family physician within 1 month prior to the date of surgery- we will give you a form for you primary physician.  We make every effort to accommodate the date you request for surgery.  There are however, times where surgery dates or times have to be moved.  We will contact you as soon as possible if a change in schedule is required.   No Aspirin /Ibuprofen  for one week before surgery.  If you are on aspirin , any non-steroidal anti-inflammatory medications (Mobic , Aleve, Ibuprofen ) you should stop taking it 7 days prior to your surgery.  You make take Tylenol   For pain prior to surgery.  Medications- If you are taking daily heart and blood pressure medications, seizure, reflux, allergy, asthma, anxiety, pain or diabetes medications, make sure the surgery center/hospital is aware before the day of surgery so they may notify you which medications to take or avoid the day of surgery. No food or drink after midnight the night before surgery unless directed otherwise by surgical center/hospital staff. No alcoholic beverages 24 hours prior to surgery.  No smoking 24 hours prior to or 24 hours after surgery. Wear loose pants or shorts- loose enough to fit over bandages, boots, and casts. No slip on shoes, sneakers are best. Bring your boot with you to the  surgery center/hospital.  Also bring crutches or a walker if your physician has prescribed it for you.  If you do not have this equipment, it will be provided for you after surgery. If you have not been contracted by the surgery center/hospital by the day before your surgery, call to confirm the date and time of your surgery. Leave-time from work may vary depending on the type of surgery you have.  Appropriate arrangements should be made prior to surgery with your employer. Prescriptions will be provided immediately following surgery by your doctor.  Have these filled as soon as possible after surgery and take the medication as directed. Remove nail polish on the operative foot. Wash the night before surgery.  The night before surgery wash the foot and leg well with the antibacterial soap provided and water  paying special attention to beneath the toenails and in between the toes.  Rinse thoroughly with water  and dry well with a towel.  Perform this wash unless told not to do so by your physician.  Enclosed: 1 Ice pack (please put in freezer the night before surgery)   1 Hibiclens  skin cleaner   Pre-op Instructions  If you have any questions regarding the instructions, do not hesitate to call our office at any point during this process.   [facility]: [address] [place], [place] [address] [phone]  [facility]: [address] [place], [place] [address] [phone]  Dr. Bobbie Burows, DPM  --  Peroneal Tendinopathy Rehab Ask your health care provider which exercises are  safe for you. Do exercises exactly as told by your health care provider and adjust them as directed. It is normal to feel mild stretching, pulling, tightness, or discomfort as you do these exercises. Stop right away if you feel sudden pain or your pain gets worse. Do not begin these exercises until told by your health care provider. Stretching and range-of-motion exercises These exercises warm up your muscles  and joints. They can help improve the movement and flexibility of your ankle. They may also help to relieve pain and stiffness. Gastrocnemius and soleus stretch, standing This is an exercise in which you stand on a step and use your body weight to stretch your calf muscles. To do this exercise: Stand on the edge of a step on the ball of your left / right foot. The ball of your foot is on the walking surface, right under your toes. Keep your other foot firmly on the same step. Hold on to the wall, a railing, or a chair for balance. Slowly lift your other foot, allowing your body weight to press your left / right heel down over the edge of the step. You should feel a stretch in your left / right calf (gastrocnemius and soleus). Hold this position for __________ seconds. Return both feet to the step. Repeat this exercise with a slight bend in your left / right knee. Repeat __________ times with your left / right knee straight and __________ times with your left / right knee bent. Complete this exercise __________ times a day. Strengthening exercises These exercises build strength and endurance in your foot and ankle. Endurance is the ability to use your muscles for a long time, even after they get tired. Ankle dorsiflexion with band  Secure a rubber exercise band or tube to an object, such as a table leg, that will not move when the band is pulled. Secure the other end of the band around your left / right foot. Sit on the floor. Face the object with your left / right leg extended. The band or tube should be slightly tense when your foot is relaxed. Slowly flex your left / right ankle and toes to bring your foot toward you (dorsiflexion). Hold this position for __________ seconds. Let the band or tube slowly pull your foot back to the starting position. Repeat __________ times. Complete this exercise __________ times a day. Ankle eversion  Sit on the floor with your legs straight out in front of  you. Loop a rubber exercise band or tube around the ball of your left / right foot. The ball of your foot is on the walking surface, right under your toes. Hold the ends of the band in your hands. You can also secure the band to a stable object. The band or tube should be slightly tense when your foot is relaxed. Slowly push your foot outward, away from your other leg (eversion). Hold this position for __________ seconds. Slowly return your foot to the starting position. Repeat __________ times. Complete this exercise __________ times a day. Plantar flexion, standing This exercise is sometimes called a standing heel raise. Stand with your feet shoulder-width apart. Place your hands on a wall or table to steady yourself as needed. Try not to use it for support. Keep your weight spread evenly over the width of your feet while you slowly rise up on your toes (plantar flexion). If told by your health care provider: Shift your weight toward your left / right leg until you feel challenged.  Stand on your left / right leg only. Hold this position for __________ seconds. Repeat __________ times. Complete this exercise __________ times a day. Single leg stand  Without shoes, stand near a railing or in a doorway. You may hold on to the railing or doorframe as needed. Stand on your left / right foot. Keep your big toe down on the floor and try to keep your arch lifted. Do not roll to the outside of your foot. If this exercise is too easy, you can try it with your eyes closed or while standing on a pillow. Hold this position for __________ seconds. Repeat __________ times. Complete this exercise __________ times a day. This information is not intended to replace advice given to you by your health care provider. Make sure you discuss any questions you have with your health care provider. Document Revised: 02/23/2022 Document Reviewed: 02/23/2022 Elsevier Patient Education  2024 [employer].

## 2024-03-21 ENCOUNTER — Telehealth: Payer: Self-pay | Admitting: Podiatry

## 2024-03-21 NOTE — Telephone Encounter (Signed)
 DOS:   04/09/24  (RT) 5TH HAMMER TOE [id number]      EFFECTIVE DATE:   11/14/23  DEDUCTIBLE:  $0.00  REMAINING:  $0.00  OOP:   $3,500.00  REMAINING :  $3,322.05   PER [employer] OF [employer] NO PRIOR AUTH IS REQ FOR CPT CODE 98119   JYN#[id number]

## 2024-04-09 ENCOUNTER — Other Ambulatory Visit: Payer: Self-pay | Admitting: Podiatry

## 2024-04-09 DIAGNOSIS — M7751 Other enthesopathy of right foot: Secondary | ICD-10-CM | POA: Diagnosis not present

## 2024-04-09 DIAGNOSIS — M2041 Other hammer toe(s) (acquired), right foot: Secondary | ICD-10-CM | POA: Diagnosis not present

## 2024-04-09 MED ORDER — PROMETHAZINE HCL 25 MG PO TABS: 25.0000 mg | ORAL_TABLET | Freq: Three times a day (TID) | ORAL | 0 refills | Status: DC | PRN

## 2024-04-09 MED ORDER — CEPHALEXIN 500 MG PO CAPS: 500.0000 mg | ORAL_CAPSULE | Freq: Three times a day (TID) | ORAL | 0 refills | Status: DC

## 2024-04-09 MED ORDER — HYDROCODONE-ACETAMINOPHEN 5-325 MG PO TABS: 1.0000 | ORAL_TABLET | Freq: Four times a day (QID) | ORAL | 0 refills | Status: DC | PRN

## 2024-04-14 ENCOUNTER — Ambulatory Visit (INDEPENDENT_AMBULATORY_CARE_PROVIDER_SITE_OTHER)

## 2024-04-14 ENCOUNTER — Ambulatory Visit (INDEPENDENT_AMBULATORY_CARE_PROVIDER_SITE_OTHER): Admitting: Podiatry

## 2024-04-14 ENCOUNTER — Encounter: Payer: Self-pay | Admitting: Podiatry

## 2024-04-14 VITALS — Ht 68.0 in | Wt 180.0 lb

## 2024-04-14 DIAGNOSIS — M205X1 Other deformities of toe(s) (acquired), right foot: Secondary | ICD-10-CM

## 2024-04-15 NOTE — Progress Notes (Signed)
 Subjective: Chief Complaint  Patient presents with   Routine Post Op    RM13: POV # 1 DOS 04/09/24 RT 5TH HAMMERTOE REPAIR   68 year old female presents the office the above concerns.  States that she has been doing well she has not had any pain.  She has been wearing the surgical shoe.  She states that she ultimately noticed improvement in her symptoms compared to what she was having preoperatively from throbbing in the toe.  She states that now she is just experiencing surgical pain.  She does not report any fevers or chills.  She has no other concerns today.  Objective: AAO x3, NAD DP/PT pulses palpable bilaterally, CRT less than 3 seconds Status post fifth digit repair of the right foot.  Sutures intact with any evidence of dehiscence.  There is no surrounding erythema, ascending cellulitis.  No drainage approximately signs of infection.  Toe is rectus.  No significant pain on exam. No pain with calf compression, swelling, warmth, erythema  Assessment: Status post right fifth digit repair  Plan: -All treatment options discussed with the patient including all alternatives, risks, complications.  -X-rays obtained reviewed.  Multiple views were obtained.  No evidence of acute fracture.  Status post arthroplasty of the right fifth toe. -Incisions healing well.  Nonadherent dressing was applied followed by dry sterile dressing.  She will keep the dressing clean, dry, intact until follow-up -Remain in surgical shoe.  Ice, elevation. -Monitor for any clinical signs or symptoms of infection and directed to call the office immediately should any occur or go to the ER. -Patient encouraged to call the office with any questions, concerns, change in symptoms.   Charity Conch DPM

## 2024-04-16 DIAGNOSIS — Z1231 Encounter for screening mammogram for malignant neoplasm of breast: Secondary | ICD-10-CM | POA: Diagnosis not present

## 2024-04-16 DIAGNOSIS — Z6827 Body mass index (BMI) 27.0-27.9, adult: Secondary | ICD-10-CM | POA: Diagnosis not present

## 2024-04-16 DIAGNOSIS — Z01419 Encounter for gynecological examination (general) (routine) without abnormal findings: Secondary | ICD-10-CM | POA: Diagnosis not present

## 2024-04-16 LAB — HM MAMMOGRAPHY

## 2024-04-24 ENCOUNTER — Ambulatory Visit (INDEPENDENT_AMBULATORY_CARE_PROVIDER_SITE_OTHER): Admitting: Podiatry

## 2024-04-24 VITALS — Temp 98.8°F

## 2024-04-24 DIAGNOSIS — M205X1 Other deformities of toe(s) (acquired), right foot: Secondary | ICD-10-CM

## 2024-04-26 NOTE — Progress Notes (Signed)
 Subjective: Chief Complaint  Patient presents with   Post-op Follow-up    RM 12 Patient is here for post op follow-up. Patient has no additional concerns.    68 year old female presents the office the above concerns.  She presents today for suture removal.  States that she has been doing well.  No significant pain.  She is eager to get back to activity.  No fevers or chills.  No other concerns.  Objective: AAO x3, NAD DP/PT pulses palpable bilaterally, CRT less than 3 seconds Status post fifth digit repair of the right foot.  Sutures are intact without any evidence of dehiscence.  There are some slight edema present at the toe but there is no erythema or warmth.  He has no drainage or pus.  Toe is rectus.  No pain on exam today.  No pain with calf compression, swelling, warmth, erythema  Assessment: Status post right fifth digit repair, presents for suture removal   Plan: -All treatment options discussed with the patient including all alternatives, risks, complications.  -Sutures removed today without complications.  Incision appears to be healing well.  Steri-Strips applied for reinforcement.  Antibiotic ointment is applied followed by dressing.  She can start to wash with soap and water , shower.  Remove the surgical shoe from the 1 to 2 weeks into the swelling has come down some more and then she can greatly start to return to her supportive shoe as tolerated.  Continue ice, elevate as well as compression to help with residual edema.  Return for as scheduled for post-op, x-ray.  Charity Conch DPM

## 2024-04-30 ENCOUNTER — Other Ambulatory Visit

## 2024-04-30 NOTE — Progress Notes (Signed)
 Scans never went through to [facility] I tried to call patient to cancel appt left message re: appt I had the orthotics rushed and should be here in a week or so -TG

## 2024-05-08 ENCOUNTER — Ambulatory Visit (INDEPENDENT_AMBULATORY_CARE_PROVIDER_SITE_OTHER)

## 2024-05-08 ENCOUNTER — Ambulatory Visit (INDEPENDENT_AMBULATORY_CARE_PROVIDER_SITE_OTHER): Admitting: Podiatry

## 2024-05-08 DIAGNOSIS — M205X1 Other deformities of toe(s) (acquired), right foot: Secondary | ICD-10-CM | POA: Diagnosis not present

## 2024-05-08 NOTE — Progress Notes (Signed)
 Chief Complaint  Patient presents with   Routine Post Op    RM#13 POV#3 DOS 04/09/2024 right foot hammertoe surgery patient states is experiencing swelling but no pain doing well.    68 year old female presents the office the above concerns.  She states that she is doing well.  She is wearing a open toed sandal as she has no concerns.  Some and some swelling which makes it uncomfortable wear closed in shoe which is not having any pain or issues.  She is asking about the status of her inserts.     Objective: AAO x3, NAD DP/PT pulses palpable bilaterally, CRT less than 3 seconds Status post fifth digit repair of the right foot.  Incision well-healed.  There are still some slight edema present to the toe but there is no signs of infection.  There is no open lesions or any drainage or increase in warmth.  There is no pain on exam.  The toe is rectus. No pain with calf compression, swelling, warmth, erythema  Assessment: Status post right fifth digit repair  Plan: -All treatment options discussed with the patient including all alternatives, risks, complications.  -X-rays obtained reviewed.  Multiple views obtained.  AP view is of poor quality but otherwise status post arthroplasty without any complicating factors noted. -From surgical perspective she is healing well.  Do expect the swelling to continue to improve.  Continue ice, elevate and discussed how to tape the toe to help control the edema.  She can gradually transition to regular shoe as tolerated.  We will check the status of her inserts.  Kristin Benjamin DPM

## 2024-05-09 ENCOUNTER — Telehealth: Payer: Self-pay | Admitting: Neurology

## 2024-05-09 DIAGNOSIS — I10 Essential (primary) hypertension: Secondary | ICD-10-CM

## 2024-05-09 DIAGNOSIS — E782 Mixed hyperlipidemia: Secondary | ICD-10-CM

## 2024-05-09 DIAGNOSIS — R232 Flushing: Secondary | ICD-10-CM

## 2024-05-09 NOTE — Telephone Encounter (Unsigned)
 See below message, patient had recent labs done in November. Can you review and advise what labs need ordered?  Copied from CRM [id number]. Topic: Clinical - Request for Lab/Test Order >> May 09, 2024  4:32 PM Sophia H wrote: Reason for CRM: needing lab orders for upcoming wellness check in July, wants to have hormones checked also(experiencing hot flashes).

## 2024-05-12 NOTE — Telephone Encounter (Signed)
 LVM letting patient know labs ordered and she would need to schedule a lab appt to have done before her visit if she wants to have it done prior.

## 2024-05-23 ENCOUNTER — Other Ambulatory Visit

## 2024-05-28 ENCOUNTER — Other Ambulatory Visit (INDEPENDENT_AMBULATORY_CARE_PROVIDER_SITE_OTHER)

## 2024-05-28 ENCOUNTER — Ambulatory Visit

## 2024-05-28 ENCOUNTER — Ambulatory Visit: Admitting: Family Medicine

## 2024-05-28 DIAGNOSIS — I10 Essential (primary) hypertension: Secondary | ICD-10-CM | POA: Diagnosis not present

## 2024-05-28 DIAGNOSIS — E782 Mixed hyperlipidemia: Secondary | ICD-10-CM | POA: Diagnosis not present

## 2024-05-28 DIAGNOSIS — R232 Flushing: Secondary | ICD-10-CM

## 2024-05-28 LAB — COMPREHENSIVE METABOLIC PANEL WITH GFR
ALT: 30 U/L (ref 0–35)
AST: 31 U/L (ref 0–37)
Albumin: 4.6 g/dL (ref 3.5–5.2)
Alkaline Phosphatase: 89 U/L (ref 39–117)
BUN: 27 mg/dL — ABNORMAL HIGH (ref 6–23)
CO2: 29 meq/L (ref 19–32)
Calcium: 9.5 mg/dL (ref 8.4–10.5)
Chloride: 105 meq/L (ref 96–112)
Creatinine, Ser: 0.75 mg/dL (ref 0.40–1.20)
GFR: 83.42 mL/min (ref >60.00)
Glucose, Bld: 102 mg/dL — ABNORMAL HIGH (ref 70–99)
Potassium: 4.7 meq/L (ref 3.5–5.1)
Sodium: 142 meq/L (ref 135–145)
Total Bilirubin: 0.5 mg/dL (ref 0.2–1.2)
Total Protein: 6.9 g/dL (ref 6.0–8.3)

## 2024-05-28 LAB — CBC WITH DIFFERENTIAL/PLATELET
Basophils Absolute: 0 K/uL (ref 0.0–0.1)
Basophils Relative: 1 % (ref 0.0–3.0)
Eosinophils Absolute: 0.2 K/uL (ref 0.0–0.7)
Eosinophils Relative: 3.8 % (ref 0.0–5.0)
HCT: 42.1 % (ref 36.0–46.0)
Hemoglobin: 14.1 g/dL (ref 12.0–15.0)
Lymphocytes Relative: 30.3 % (ref 12.0–46.0)
Lymphs Abs: 1.5 K/uL (ref 0.7–4.0)
MCHC: 33.4 g/dL (ref 30.0–36.0)
MCV: 95.3 fl (ref 78.0–100.0)
Monocytes Absolute: 0.5 K/uL (ref 0.1–1.0)
Monocytes Relative: 9.4 % (ref 3.0–12.0)
Neutro Abs: 2.7 K/uL (ref 1.4–7.7)
Neutrophils Relative %: 55.5 % (ref 43.0–77.0)
Platelets: 324 K/uL (ref 150.0–400.0)
RBC: 4.42 Mil/uL (ref 3.87–5.11)
RDW: 13 % (ref 11.5–15.5)
WBC: 4.9 K/uL (ref 4.0–10.5)

## 2024-05-28 LAB — LIPID PANEL
Cholesterol: 191 mg/dL (ref 0–200)
HDL: 48.7 mg/dL (ref >39.00)
LDL Cholesterol: 104 mg/dL — ABNORMAL HIGH (ref 0–99)
NonHDL: 142.34
Total CHOL/HDL Ratio: 4
Triglycerides: 192 mg/dL — ABNORMAL HIGH (ref 0.0–149.0)
VLDL: 38.4 mg/dL (ref 0.0–40.0)

## 2024-05-28 LAB — LUTEINIZING HORMONE: LH: 34.36 m[IU]/mL

## 2024-05-28 LAB — TSH: TSH: 1.75 u[IU]/mL (ref 0.35–5.50)

## 2024-05-28 LAB — FOLLICLE STIMULATING HORMONE: FSH: 54.5 m[IU]/mL

## 2024-05-28 LAB — ESTRADIOL: Estradiol: <15 pg/mL

## 2024-05-28 NOTE — Progress Notes (Signed)
 Patient presents today to pick up custom molded foot orthotics, diagnosed with Capsulitis by Dr. Ardelle Anton.   Orthotics were dispensed and fit was satisfactory. Reviewed instructions for break-in and wear. Written instructions given to patient.  Patient will follow up as needed. Addison Bailey Cped, CFo, CFm

## 2024-05-29 ENCOUNTER — Other Ambulatory Visit: Payer: Self-pay | Admitting: Family Medicine

## 2024-05-29 ENCOUNTER — Ambulatory Visit: Payer: Self-pay | Admitting: Family Medicine

## 2024-05-29 DIAGNOSIS — I700 Atherosclerosis of aorta: Secondary | ICD-10-CM

## 2024-05-29 DIAGNOSIS — N390 Urinary tract infection, site not specified: Secondary | ICD-10-CM

## 2024-05-29 DIAGNOSIS — D126 Benign neoplasm of colon, unspecified: Secondary | ICD-10-CM | POA: Insufficient documentation

## 2024-05-29 DIAGNOSIS — G43009 Migraine without aura, not intractable, without status migrainosus: Secondary | ICD-10-CM | POA: Insufficient documentation

## 2024-05-29 DIAGNOSIS — E782 Mixed hyperlipidemia: Secondary | ICD-10-CM

## 2024-05-29 DIAGNOSIS — E559 Vitamin D deficiency, unspecified: Secondary | ICD-10-CM | POA: Insufficient documentation

## 2024-05-29 DIAGNOSIS — I10 Essential (primary) hypertension: Secondary | ICD-10-CM

## 2024-05-29 DIAGNOSIS — I2510 Atherosclerotic heart disease of native coronary artery without angina pectoris: Secondary | ICD-10-CM

## 2024-05-29 DIAGNOSIS — N952 Postmenopausal atrophic vaginitis: Secondary | ICD-10-CM | POA: Insufficient documentation

## 2024-05-29 DIAGNOSIS — M170 Bilateral primary osteoarthritis of knee: Secondary | ICD-10-CM | POA: Insufficient documentation

## 2024-05-29 NOTE — Telephone Encounter (Signed)
Pended Cardiology referral

## 2024-05-30 ENCOUNTER — Ambulatory Visit (INDEPENDENT_AMBULATORY_CARE_PROVIDER_SITE_OTHER): Admitting: Family Medicine

## 2024-05-30 ENCOUNTER — Encounter: Payer: Self-pay | Admitting: Family Medicine

## 2024-05-30 VITALS — BP 119/82 | HR 76 | Ht 68.0 in | Wt 181.0 lb

## 2024-05-30 DIAGNOSIS — I10 Essential (primary) hypertension: Secondary | ICD-10-CM

## 2024-05-30 DIAGNOSIS — R42 Dizziness and giddiness: Secondary | ICD-10-CM | POA: Diagnosis not present

## 2024-05-30 DIAGNOSIS — E782 Mixed hyperlipidemia: Secondary | ICD-10-CM

## 2024-05-30 DIAGNOSIS — I2510 Atherosclerotic heart disease of native coronary artery without angina pectoris: Secondary | ICD-10-CM

## 2024-05-30 DIAGNOSIS — F419 Anxiety disorder, unspecified: Secondary | ICD-10-CM

## 2024-05-30 DIAGNOSIS — I700 Atherosclerosis of aorta: Secondary | ICD-10-CM

## 2024-05-30 DIAGNOSIS — F32A Depression, unspecified: Secondary | ICD-10-CM

## 2024-05-30 MED ORDER — MECLIZINE HCL 25 MG PO TABS: 25.0000 mg | ORAL_TABLET | Freq: Three times a day (TID) | ORAL | 1 refills | Status: DC | PRN

## 2024-05-30 NOTE — Assessment & Plan Note (Signed)
-  Reviewed most recent lipid panel -Medication management: continue Lipitor 80 mg daily -Diet low in saturated fat -Regular exercise - at least 30 minutes, 5 times per week

## 2024-05-30 NOTE — Assessment & Plan Note (Signed)
 Stable on Zoloft  50 mg and Wellbutrin  150 mg. No SI/HI.

## 2024-05-30 NOTE — Assessment & Plan Note (Signed)
See CAD 

## 2024-05-30 NOTE — Assessment & Plan Note (Signed)
 Blood pressure is at goal for age and co-morbidities.   Recommendations: valsartan -HCTZ 160-12.5 mg daily - BP goal <130/80 - monitor and log blood pressures at home - check around the same time each day in a relaxed setting - Limit salt to <2000 mg/day - Follow DASH eating plan (heart healthy diet) - limit alcohol to 2 standard drinks per day for men and 1 per day for women - avoid tobacco products - get at least 2 hours of regular aerobic exercise weekly Patient aware of signs/symptoms requiring further/urgent evaluation.

## 2024-05-30 NOTE — Assessment & Plan Note (Signed)
 Coronary artery disease with improved LDL levels from 126 to 104. On atorvastatin  80 mg. Family history of hypercholesterolemia. - Follow up with cardiologist for potential adjustment in cholesterol management. Referral recently placed. - Continue current statin therapy at 80 mg. Likely stricter LDL goal.

## 2024-05-30 NOTE — Progress Notes (Addendum)
 Established Patient Office Visit  Subjective   Patient ID: Kristin Benjamin, female    DOB: [birth year]  Age: 68 y.o. MRN: [id number]  Chief Complaint  Patient presents with   Medical Management of Chronic Issues    HPI  Patient is here for routine follow-up, chronic disease management.   Discussed the use of AI scribe software for clinical note transcription with the patient, who gave verbal consent to proceed.  History of Present Illness Kristin Benjamin is a 68 year old female who presents for regular follow-up with dizziness upon changing positions.  She experiences significant vertigo, particularly when lying down or standing up, with symptoms lasting a few seconds. The sensation is described as 'the room starts spinning' and occurs when she turns over in bed. No nausea or blackouts. She has not had any recent cold symptoms, though she did experience a couple of allergy attacks recently.  She has a history of hammer toe arthritis, for which she underwent surgery on her little toe, resulting in a two-month recovery period. Her foot is now better, but she is concerned about her weight and has resumed physical activity. She also mentions that her leg is a quarter inch longer than the other, causing some hip pain. She uses orthopedic inserts to address this issue.  She is currently taking meloxicam  as needed for hip pain, which she attributes to the leg length discrepancy and her knee replacement.   Patient denies any chest pain, palpitations, dyspnea, wheezing, edema, recurrent headaches, vision changes.       Hypertension: - Medications: valsartan -hctz 160-12.5 mg daily - Compliance: good - Checking BP at home:  - Denies any SOB, recurrent headaches, CP, vision changes, LE edema, dizziness, palpitations, or medication side effects. - Diet: heart healthy - Exercise: walking, swimming, pickleball    Hyperlipidemia, CAD: - CT chest 2024 showed aortic atherosclerosis and  two-vessel coronary artery disease (referral to cardiology placed) - medications: atorvastatin  80 mg daily - compliance: good - medication SEs: no The 10-year ASCVD risk score (Arnett DK, et al., 2019) is: 6.6%   Values used to calculate the score:     Age: 12 years     Clincally relevant sex: Female     Is Non-Hispanic African American: No     Diabetic: No     Tobacco smoker: No     Systolic Blood Pressure: 119 mmHg     Is BP treated: Yes     HDL Cholesterol: 48.7 mg/dL     Total Cholesterol: 191 mg/dL   Mood follow-up: - Diagnosis: anxiety and depression - Treatment: Wellbutrin  150 mg daily, Zoloft  50 mg daily - Medication side effects: none - SI/HI: no - Update: Mood has been good and Wellbutrin  has helped her stay away from cigarettes.        ROS All review of systems negative except what is listed in the HPI    Objective:     BP 119/82   Pulse 76   Ht 5' 8 (1.727 m)   Wt 181 lb (82.1 kg)   SpO2 98%   BMI 27.52 kg/m   Orthostatic VS for the past 24 hrs:  BP- Lying Pulse- Lying BP- Sitting Pulse- Sitting BP- Standing at 0 minutes Pulse- Standing at 0 minutes  05/30/24 0849 127/80 69 118/80 79 115/81 82      Physical Exam Vitals reviewed.  Constitutional:      Appearance: Normal appearance.  HENT:     Head:  Comments: + Dix-Hallpike (left)    Right Ear: A middle ear effusion is present.     Left Ear: Tympanic membrane normal.  Cardiovascular:     Rate and Rhythm: Normal rate and regular rhythm.     Heart sounds: Normal heart sounds.  Pulmonary:     Effort: Pulmonary effort is normal.     Breath sounds: Normal breath sounds.  Skin:    General: Skin is warm and dry.  Neurological:     Mental Status: She is alert and oriented to person, place, and time.  Psychiatric:        Mood and Affect: Mood normal.        Behavior: Behavior normal.        Thought Content: Thought content normal.        Judgment: Judgment normal.      No results  found for any visits on 05/30/24.    The 10-year ASCVD risk score (Arnett DK, et al., 2019) is: 6.6%    Assessment & Plan:   Problem List Items Addressed This Visit       Active Problems   Mixed hyperlipidemia (Chronic)   -Reviewed most recent lipid panel -Medication management: continue Lipitor 80 mg daily -Diet low in saturated fat -Regular exercise - at least 30 minutes, 5 times per week        Primary hypertension (Chronic)   Blood pressure is at goal for age and co-morbidities.   Recommendations: valsartan -HCTZ 160-12.5 mg daily - BP goal <130/80 - monitor and log blood pressures at home - check around the same time each day in a relaxed setting - Limit salt to <2000 mg/day - Follow DASH eating plan (heart healthy diet) - limit alcohol to 2 standard drinks per day for men and 1 per day for women - avoid tobacco products - get at least 2 hours of regular aerobic exercise weekly Patient aware of signs/symptoms requiring further/urgent evaluation.       Anxiety and depression (Chronic)   Stable on Zoloft  50 mg and Wellbutrin  150 mg. No SI/HI.       Coronary artery disease involving native coronary artery of native heart without angina pectoris   Coronary artery disease with improved LDL levels from 126 to 104. On atorvastatin  80 mg. Family history of hypercholesterolemia. - Follow up with cardiologist for potential adjustment in cholesterol management. Referral recently placed. - Continue current statin therapy at 80 mg. Likely stricter LDL goal.       Aortic atherosclerosis (HCC)   See CAD      Other Visit Diagnoses       Vertigo    -  Primary Negative orthostatics. Significant vertigo with positional changes. Meclizine considered for relief but may cause drowsiness. - Recommend Epley maneuver exercises - she would like to try this before formal PT - Prescribe meclizine for symptomatic relief, cautioning about drowsiness. - Advise using Flonase  nasal  spray. - Consider referral to physical therapy for vertigo rehabilitation if symptoms persist after one week.   Relevant Medications   meclizine (ANTIVERT) 25 MG tablet          Return in about 6 months (around 11/30/2024) for chronic disease management.    Waddell KATHEE Mon, NP

## 2024-06-02 ENCOUNTER — Ambulatory Visit
Admission: RE | Admit: 2024-06-02 | Discharge: 2024-06-02 | Disposition: A | Discharge status: Home / Self Care | Source: Ambulatory Visit | Attending: Acute Care | Admitting: Acute Care

## 2024-06-02 DIAGNOSIS — Z122 Encounter for screening for malignant neoplasm of respiratory organs: Secondary | ICD-10-CM | POA: Diagnosis not present

## 2024-06-02 DIAGNOSIS — F17210 Nicotine dependence, cigarettes, uncomplicated: Secondary | ICD-10-CM

## 2024-06-02 DIAGNOSIS — Z87891 Personal history of nicotine dependence: Secondary | ICD-10-CM

## 2024-06-09 ENCOUNTER — Other Ambulatory Visit: Payer: Self-pay | Admitting: Acute Care

## 2024-06-09 ENCOUNTER — Encounter: Payer: Self-pay | Admitting: Family Medicine

## 2024-06-09 DIAGNOSIS — G8929 Other chronic pain: Secondary | ICD-10-CM

## 2024-06-09 DIAGNOSIS — Z122 Encounter for screening for malignant neoplasm of respiratory organs: Secondary | ICD-10-CM

## 2024-06-09 DIAGNOSIS — F17210 Nicotine dependence, cigarettes, uncomplicated: Secondary | ICD-10-CM

## 2024-06-09 DIAGNOSIS — Z87891 Personal history of nicotine dependence: Secondary | ICD-10-CM

## 2024-06-09 MED ORDER — MELOXICAM 15 MG PO TABS: 15.0000 mg | ORAL_TABLET | Freq: Every day | ORAL | 3 refills | Status: AC

## 2024-06-12 DIAGNOSIS — N958 Other specified menopausal and perimenopausal disorders: Secondary | ICD-10-CM | POA: Diagnosis not present

## 2024-06-25 ENCOUNTER — Ambulatory Visit: Payer: Self-pay

## 2024-06-25 NOTE — Telephone Encounter (Signed)
 FYI Only or Action Required?: FYI only for provider.  Patient was last seen in primary care on 05/30/2024 by [name] NOVAK, NP.  Called Nurse Triage reporting Back Pain.  Symptoms began several days ago.  Interventions attempted: Other: cranberry juice.  Symptoms are: unchanged.  Triage Disposition: See PCP When Office is Open (Within 3 Days)  Patient/caregiver understands and will follow disposition?: Yes      Copied from CRM #[id number]. Topic: Clinical - Red Word Triage >> Jun 25, 2024  1:54 PM Pinkey ORN wrote: Red Word that prompted transfer to Nurse Triage: Back Pain >> Jun 25, 2024  1:55 PM Pinkey ORN wrote: Patient states she's experiencing some back pains, stomach pains and frequent urination. Patient believes she may have possible UTI  Reason for Disposition  [1] MODERATE back pain (e.g., interferes with normal activities) AND [2] present > 3 days  Answer Assessment - Initial Assessment Questions 1. ONSET: When did the pain begin? (e.g., minutes, hours, days)     4-5 days 2. LOCATION: Where does it hurt? (upper, mid or lower back)     Lower back pain 3. SEVERITY: How bad is the pain?  (e.g., Scale 1-10; mild, moderate, or severe)     3/10 4. PATTERN: Is the pain constant? (e.g., yes, no; constant, intermittent)      Constant ache 5. RADIATION: Does the pain shoot into your legs or somewhere else?     no 6. CAUSE:  What do you think is causing the back pain?      uti 7. BACK OVERUSE:  Any recent lifting of heavy objects, strenuous work or exercise?     no 8. MEDICINES: What have you taken so far for the pain? (e.g., nothing, acetaminophen , NSAIDS)     nothing 9. NEUROLOGIC SYMPTOMS: Do you have any weakness, numbness, or problems with bowel/bladder control?     no 10. OTHER SYMPTOMS: Do you have any other symptoms? (e.g., fever, abdomen pain, burning with urination, blood in urine) Abd pain, urinary frequency and urgency  Protocols used:  Back Pain-A-AH

## 2024-06-25 NOTE — Telephone Encounter (Signed)
 Appt scheduled

## 2024-06-25 NOTE — Progress Notes (Signed)
 Chief Complaint  Patient presents with   Urinary Frequency    Onset: 5 days    Kristin Benjamin is a 68 y.o. female here for possible UTI. Low belly ache, frequent urination.   Duration: 5 days. Symptoms: Dysuria, urinary frequency Denies: hematuria, urinary retention, fever, chills, nausea, and vomiting, vaginal discharge Hx of recurrent UTI? Yes Denies new sexual partners.   Past Medical History:  Diagnosis Date   Allergy    Anxiety    Arthritis    Depression    Headache    history of   Heart murmur    in the past   Hx of adenomatous colonic polyps 07/17/2017   Hyperlipidemia    Hypertension      BP 128/70   Pulse 60   Temp 98.9 F (37.2 C)   Resp 18   Ht 5' 8 (1.727 m)   Wt 182 lb 6.4 oz (82.7 kg)   SpO2 99%   BMI 27.73 kg/m  General: Awake, alert, appears stated age Heart: RRR Lungs: CTAB, normal respiratory effort, no accessory muscle usage Abd: BS+, soft, NT, ND, no masses or organomegaly MSK: No CVA tenderness,  Psych: Age appropriate judgment and insight  Urinary frequency - Plan: Urine Culture, POCT Urinalysis Dipstick, nitrofurantoin , macrocrystal-monohydrate, (MACROBID ) 100 MG capsule, phenazopyridine  (PYRIDIUM ) 100 MG tablet, CANCELED: Urinalysis  Stay hydrated. Trace Leuks. Urine Culture pending. Rx. Macrobid  & pyridium  Seek immediate care if pt starts to develop fevers, new/worsening symptoms, uncontrollable N/V. F/u prn. The patient voiced understanding and agreement to the plan.  Harlene LITTIE Jolly, DNP, AGNP-C 06/26/24 3:50 PM

## 2024-06-26 ENCOUNTER — Ambulatory Visit (INDEPENDENT_AMBULATORY_CARE_PROVIDER_SITE_OTHER): Admitting: Student

## 2024-06-26 ENCOUNTER — Ambulatory Visit: Admitting: Student

## 2024-06-26 ENCOUNTER — Encounter: Payer: Self-pay | Admitting: Student

## 2024-06-26 VITALS — BP 128/70 | HR 60 | Temp 98.9°F | Resp 18 | Ht 68.0 in | Wt 182.4 lb

## 2024-06-26 DIAGNOSIS — R350 Frequency of micturition: Secondary | ICD-10-CM

## 2024-06-26 LAB — POCT URINALYSIS DIPSTICK
Bilirubin, UA: NEGATIVE
Blood, UA: NEGATIVE
Glucose, UA: NEGATIVE
Ketones, UA: NEGATIVE
Nitrite, UA: NEGATIVE
Protein, UA: NEGATIVE
Spec Grav, UA: 1.015 (ref 1.010–1.025)
Urobilinogen, UA: 0.2 U/dL
pH, UA: 5 (ref 5.0–8.0)

## 2024-06-26 MED ORDER — PHENAZOPYRIDINE HCL 100 MG PO TABS
100.0000 mg | ORAL_TABLET | Freq: Three times a day (TID) | ORAL | 0 refills | Status: DC | PRN
Start: 2024-06-26 — End: 2024-09-04

## 2024-06-26 MED ORDER — NITROFURANTOIN MONOHYD MACRO 100 MG PO CAPS: 100.0000 mg | ORAL_CAPSULE | Freq: Two times a day (BID) | ORAL | 0 refills | Status: AC

## 2024-06-28 LAB — URINE CULTURE
MICRO NUMBER:: 16832129
SPECIMEN QUALITY:: ADEQUATE

## 2024-06-29 ENCOUNTER — Ambulatory Visit: Payer: Self-pay | Admitting: Student

## 2024-07-24 ENCOUNTER — Other Ambulatory Visit: Payer: Self-pay | Admitting: *Deleted

## 2024-07-24 ENCOUNTER — Other Ambulatory Visit

## 2024-07-24 ENCOUNTER — Other Ambulatory Visit (INDEPENDENT_AMBULATORY_CARE_PROVIDER_SITE_OTHER): Admitting: *Deleted

## 2024-07-24 ENCOUNTER — Other Ambulatory Visit: Payer: Self-pay | Admitting: Family Medicine

## 2024-07-24 DIAGNOSIS — R350 Frequency of micturition: Secondary | ICD-10-CM | POA: Diagnosis not present

## 2024-07-24 DIAGNOSIS — F419 Anxiety disorder, unspecified: Secondary | ICD-10-CM

## 2024-07-24 LAB — POCT URINALYSIS DIP (MANUAL ENTRY)
Bilirubin, UA: NEGATIVE
Blood, UA: NEGATIVE
Glucose, UA: NEGATIVE mg/dL
Ketones, POC UA: NEGATIVE mg/dL
Nitrite, UA: NEGATIVE
Protein Ur, POC: NEGATIVE mg/dL
Spec Grav, UA: 1.015 (ref 1.010–1.025)
Urobilinogen, UA: 0.2 U/dL
pH, UA: 6.5 (ref 5.0–8.0)

## 2024-07-24 MED ORDER — SULFAMETHOXAZOLE-TRIMETHOPRIM 800-160 MG PO TABS
1.0000 | ORAL_TABLET | Freq: Two times a day (BID) | ORAL | 0 refills | Status: DC
Start: 2024-07-24 — End: 2024-09-17

## 2024-07-25 LAB — URINE CULTURE
MICRO NUMBER:: 16954980
Result:: NO GROWTH
SPECIMEN QUALITY:: ADEQUATE

## 2024-08-18 DIAGNOSIS — K080 Exfoliation of teeth due to systemic causes: Secondary | ICD-10-CM | POA: Diagnosis not present

## 2024-08-21 ENCOUNTER — Other Ambulatory Visit (HOSPITAL_BASED_OUTPATIENT_CLINIC_OR_DEPARTMENT_OTHER): Payer: Self-pay

## 2024-08-21 MED ORDER — FLUZONE HIGH-DOSE 0.5 ML IM SUSY
0.5000 mL | PREFILLED_SYRINGE | Freq: Once | INTRAMUSCULAR | 0 refills | Status: AC
Start: 2024-08-21 — End: 2024-08-22
  Filled 2024-08-21: qty 0.5, 1d supply, fill #0

## 2024-08-29 NOTE — Progress Notes (Unsigned)
 CARDIOLOGY CONSULT NOTE       Patient ID: Kristin Benjamin MRN: [id number] DOB/AGE: [birth year] 68 y.o.  Referring Physician: Almarie Primary Physician: Kristin Benjamin NOVAK, NP Primary Cardiologist: Kristin Benjamin    HPI:  68 y.o. referred by Almarie NP for Benjamin. She is current smoker with HTN, HLD Anxiety/Depression. 42 packyear history smoking. Lung cancer CT done 06/08/24 with LAD calcium  and dilated aorta 4.2 cm with aortic atherosclerosis. No lung cancer noted. She has a left hammer toe post surgery with 2 months recovery.  and leg length difference with pain ambulating. Uses meloxicam  Suffers from vertigo as well. No clinical angina, palpitations, dyspnea or syncope. Echo done in 2015 for murmur was normal . ECG 09/20/23 showed SR normal ST segments and poor R wave progression read as anterolateral infarct  She is very active now that her foot is better and has had both knees replaced. No need for stress testing as she plays pickle ball with no angina.   Discussed LDL not being at goal. Discussed starting Zetia  ECG today shows sinus with poor R wave progression no acute ST changes   ROS All other systems reviewed and negative except as noted above  Past Medical History:  Diagnosis Date   Allergy    Anxiety    Arthritis    Depression    Headache    history of   Heart murmur    in the past   Hx of adenomatous colonic polyps 07/17/2017   Hyperlipidemia    Hypertension     Family History  Problem Relation Age of Onset   Hyperlipidemia Mother    Arthritis Mother    Depression Father    Hyperlipidemia Father    Diabetes Father    Alcohol abuse Father    Arthritis Father    Arthritis Sister    Hyperlipidemia Sister    Arthritis Sister    Depression Sister    Hyperlipidemia Sister    Hyperlipidemia Maternal Grandmother    Arthritis Maternal Grandmother    Stroke Maternal Grandfather    Hyperlipidemia Maternal Grandfather    Arthritis Maternal Grandfather     Hyperlipidemia Paternal Grandmother    Arthritis Paternal Grandmother    Hyperlipidemia Paternal Grandfather    Arthritis Paternal Grandfather    Colon cancer Neg Hx    Esophageal cancer Neg Hx    Rectal cancer Neg Hx    Stomach cancer Neg Hx    Colon polyps Neg Hx     Social History   Socioeconomic History   Marital status: Married    Spouse name: Not on file   Number of children: Not on file   Years of education: Not on file   Highest education level: Associate degree: occupational, Scientist, product/process development, or vocational program  Occupational History   Occupation: retired  Tobacco Use   Smoking status: Former    Current packs/day: 1.00    Average packs/day: 1 pack/day for 42.0 years (42.0 ttl pk-yrs)    Types: Cigarettes   Smokeless tobacco: Never   Tobacco comments:    Patient is interested in utilizing Wellbutrin  and Nicotine patchs again  Vaping Use   Vaping status: Never Used  Substance and Sexual Activity   Alcohol use: Yes    Alcohol/week: 3.0 - 5.0 standard drinks of alcohol    Types: 3 - 5 Glasses of wine per week    Comment: 3-5 wine per week per pt   Drug use: Yes    Types:  Marijuana    Comment: occasionally--monthly   Sexual activity: Yes  Other Topics Concern   Not on file  Social History Narrative   Not on file   Social Drivers of Health   Financial Resource Strain: Low Risk  (05/27/2024)   Overall Financial Resource Strain (CARDIA)    Difficulty of Paying Living Expenses: Not hard at all  Food Insecurity: No Food Insecurity (05/27/2024)   Hunger Vital Sign    Worried About Running Out of Food in the Last Year: Never true    Ran Out of Food in the Last Year: Never true  Transportation Needs: No Transportation Needs (05/27/2024)   PRAPARE - Administrator, Civil Service (Medical): No    Lack of Transportation (Non-Medical): No  Physical Activity: Sufficiently Active (05/27/2024)   Exercise Vital Sign    Days of Exercise per Week: 5 days    Minutes  of Exercise per Session: 50 min  Stress: No Stress Concern Present (05/27/2024)   Kristin Benjamin of Occupational Health - Occupational Stress Questionnaire    Feeling of Stress: Only a little  Social Connections: Moderately Integrated (05/27/2024)   Social Connection and Isolation Panel    Frequency of Communication with Friends and Family: Twice a week    Frequency of Social Gatherings with Friends and Family: Twice a week    Attends Religious Services: Patient declined    Active Member of Clubs or Organizations: Yes    Attends Engineer, structural: More than 4 times per year    Marital Status: Married  Catering manager Violence: Not At Risk (09/28/2023)   Humiliation, Afraid, Rape, and Kick questionnaire    Fear of Current or Ex-Partner: No    Emotionally Abused: No    Physically Abused: No    Sexually Abused: No    Past Surgical History:  Procedure Laterality Date   bladder tack     COLONOSCOPY  2006   and 2018   FUNCTIONAL ENDOSCOPIC SINUS SURGERY     JOINT REPLACEMENT     Left total knee arthroplasty Dr. Vernetta 10-18-18   KNEE ARTHROSCOPY W/ MENISCAL REPAIR Right 08/2016   TOTAL KNEE ARTHROPLASTY Left 10/18/2018   Procedure: LEFT TOTAL KNEE ARTHROPLASTY;  Surgeon: Vernetta Lonni GRADE, MD;  Location: [facility];  Service: Orthopedics;  Laterality: Left;   TOTAL KNEE ARTHROPLASTY Right 09/28/2023   Procedure: RIGHT TOTAL KNEE ARTHROPLASTY;  Surgeon: Vernetta Lonni GRADE, MD;  Location: [facility];  Service: Orthopedics;  Laterality: Right;   TUBAL LIGATION  1993      Current Outpatient Medications:    atorvastatin  (LIPITOR) 80 MG tablet, Take 1 tablet (80 mg total) by mouth daily., Disp: 90 tablet, Rfl: 3   buPROPion  (WELLBUTRIN  SR) 150 MG 12 hr tablet, Take 1 tablet (150 mg total) by mouth daily., Disp: 90 tablet, Rfl: 3   CANNABIDIOL PO, Take 750 mg by mouth in the morning. CBD, Disp: , Rfl:    cetirizine (ZYRTEC) 10 MG tablet, Take 10 mg by mouth in the  morning., Disp: , Rfl:    estradiol  (ESTRACE  VAGINAL) 0.1 MG/GM vaginal cream, Place 1 Applicatorful vaginally every other day. At night., Disp: 42.5 g, Rfl: 1   fluticasone  (FLONASE ) 50 MCG/ACT nasal spray, Place 2 sprays into both nostrils daily. (Patient taking differently: Place 2 sprays into both nostrils daily as needed.), Disp: 16 g, Rfl: 3   meloxicam  (MOBIC ) 15 MG tablet, Take 1 tablet (15 mg total) by mouth daily., Disp: 30 tablet, Rfl:  3   sertraline  (ZOLOFT ) 50 MG tablet, Take 1 tablet (50 mg total) by mouth daily., Disp: 90 tablet, Rfl: 1   sulfamethoxazole -trimethoprim  (BACTRIM  DS) 800-160 MG tablet, Take 1 tablet by mouth 2 (two) times daily., Disp: 6 tablet, Rfl: 0   SUMAtriptan  (IMITREX ) 100 MG tablet, Take 100 mg by mouth every 2 (two) hours as needed., Disp: , Rfl:    traZODone  (DESYREL ) 50 MG tablet, Take 0.5-1 tablets (25-50 mg total) by mouth at bedtime as needed for sleep., Disp: 30 tablet, Rfl: 3   trimethoprim  (TRIMPEX ) 100 MG tablet, TAKE 1 TABLET DAILY AS NEEDED AFTER INTERCOURSE, Disp: 20 tablet, Rfl: 17   valsartan -hydrochlorothiazide  (DIOVAN -HCT) 160-12.5 MG tablet, TAKE 1 TABLET DAILY, Disp: 90 tablet, Rfl: 3    Physical Exam: Blood pressure 119/79, pulse 76, resp. rate 16, height 5' 8 (1.727 m), weight 180 lb 3.2 oz (81.7 kg), SpO2 96%.    Affect appropriate Healthy:  appears stated age HEENT: normal Neck supple with no adenopathy JVP normal no bruits no thyromegaly Lungs clear with no wheezing and good diaphragmatic motion Heart:  S1/S2 no murmur, no rub, gallop or click PMI normal Abdomen: benighn, BS positve, no tenderness, no AAA no bruit.  No HSM or HJR Distal pulses intact with no bruits No edema Neuro non-focal Skin warm and dry Prior bilateral TKR;s    Labs:   Lab Results  Component Value Date   WBC 4.9 05/28/2024   HGB 14.1 05/28/2024   HCT 42.1 05/28/2024   MCV 95.3 05/28/2024   PLT 324.0 05/28/2024   No results for input(s): NA,  K, CL, CO2, BUN, CREATININE, CALCIUM , PROT, BILITOT, ALKPHOS, ALT, AST, GLUCOSE in the last 168 hours.  Invalid input(s): LABALBU No results found for: CKTOTAL, CKMB, CKMBINDEX, TROPONINI  Lab Results  Component Value Date   CHOL 191 05/28/2024   CHOL 218 (H) 09/18/2023   CHOL 225 (H) 09/26/2022   Lab Results  Component Value Date   HDL 48.70 05/28/2024   HDL 45.80 09/18/2023   HDL 42.80 09/26/2022   Lab Results  Component Value Date   LDLCALC 104 (H) 05/28/2024   LDLCALC 126 (H) 09/18/2023   Lab Results  Component Value Date   TRIG 192.0 (H) 05/28/2024   TRIG 228.0 (H) 09/18/2023   TRIG 252.0 (H) 09/26/2022   Lab Results  Component Value Date   CHOLHDL 4 05/28/2024   CHOLHDL 5 09/18/2023   CHOLHDL 5 09/26/2022   Lab Results  Component Value Date   LDLDIRECT 138.0 09/26/2022   LDLDIRECT 143.0 03/31/2022   LDLDIRECT 132.0 02/16/2022      Radiology: No results found.  EKG: See HPI   ASSESSMENT AND PLAN:   Benjamin:  seen on lung cancer CT. No angina but abnormal ECG with ? Anterolateral MI vs poor R wave progression. Very active with no symptoms no need for stress testing at this time  HLD:  started on high dose lipitor by primary LDL 104 on labs 05/28/24 Add zetia 10 mg f/u lipid and liver in 3 months GAD:  continue welbutrin HTN:  controlled continue Diovan  HCT Vertigo:  PRN antivert  consider vest[facility] Smoking:  lung CT no cancer. Counseled on cessation < 10 minutes discussed relationship of coronary calcium  to smoking she has had luck with nicotine patches before and is on Welbutrin   Zetia 10 mg daily  Lipid/Liver  F/U in a year pending tests   Signed: Maude Emmer 09/04/2024, 10:50 AM

## 2024-09-04 ENCOUNTER — Ambulatory Visit: Attending: Internal Medicine | Admitting: Cardiovascular Disease

## 2024-09-04 ENCOUNTER — Encounter: Payer: Self-pay | Admitting: Cardiovascular Disease

## 2024-09-04 VITALS — BP 119/79 | HR 76 | Resp 16 | Ht 68.0 in | Wt 180.2 lb

## 2024-09-04 DIAGNOSIS — I2510 Atherosclerotic heart disease of native coronary artery without angina pectoris: Secondary | ICD-10-CM

## 2024-09-04 DIAGNOSIS — E782 Mixed hyperlipidemia: Secondary | ICD-10-CM

## 2024-09-04 DIAGNOSIS — I10 Essential (primary) hypertension: Secondary | ICD-10-CM | POA: Diagnosis not present

## 2024-09-04 MED ORDER — EZETIMIBE 10 MG PO TABS: 10.0000 mg | ORAL_TABLET | Freq: Every day | ORAL | 3 refills | Status: DC

## 2024-09-04 NOTE — Patient Instructions (Addendum)
 Medication Instructions:   Start Zetia 10 mg daily   *If you need a refill on your cardiac medications before your next appointment, please call your pharmacy*   Lab Work:fasting in 3 months  Lipid  liver panel   If you have labs (blood work) drawn today and your tests are completely normal, you will receive your results only by: MyChart Message (if you have MyChart) OR A paper copy in the mail If you have any lab test that is abnormal or we need to change your treatment, we will call you to review the results.   Testing/Procedures: Not needed   Follow-Up: At [facility], you and your health needs are our priority.  As part of our continuing mission to provide you with exceptional heart care, we have created designated Provider Care Teams.  These Care Teams include your primary Cardiologist (physician) and Advanced Practice Providers (APPs -  Physician Assistants and Nurse Practitioners) who all work together to provide you with the care you need, when you need it.  We recommend signing up for the patient portal called MyChart.  Sign up information is provided on this After Visit Summary.  MyChart is used to connect with patients for Virtual Visits (Telemedicine).  Patients are able to view lab/test results, encounter notes, upcoming appointments, etc.  Non-urgent messages can be sent to your provider as well.   To learn more about what you can do with MyChart, go to [email or web address].    Your next appointment:   12  month(s)  The format for your next appointment:   In Person  Provider:   Maude Emmer, MD

## 2024-09-15 ENCOUNTER — Encounter: Payer: Self-pay | Admitting: Radiology

## 2024-09-17 ENCOUNTER — Ambulatory Visit: Admitting: *Deleted

## 2024-09-17 ENCOUNTER — Other Ambulatory Visit: Payer: Self-pay | Admitting: Family Medicine

## 2024-09-17 VITALS — Ht 68.0 in | Wt 178.0 lb

## 2024-09-17 DIAGNOSIS — Z0000 Encounter for general adult medical examination without abnormal findings: Secondary | ICD-10-CM | POA: Diagnosis not present

## 2024-09-17 DIAGNOSIS — G4700 Insomnia, unspecified: Secondary | ICD-10-CM

## 2024-09-17 NOTE — Patient Instructions (Signed)
 Kristin Benjamin,  Thank you for taking the time for your Medicare Wellness Visit. I appreciate your continued commitment to your health goals. Please review the care plan we discussed, and feel free to reach out if I can assist you further.  Please note that Annual Wellness Visits do not include a physical exam. Some assessments may be limited, especially if the visit was conducted virtually. If needed, we may recommend an in-person follow-up with your provider.  Ongoing Care Seeing your primary care provider every 3 to 6 months helps us  monitor your health and provide consistent, personalized care.   Waddell Mon, NP:  12/02/24 8:40am  Annual Wellness Visit: 09/22/25 1pm, telephone  Recommended Screenings:  You will need to get the following vaccines at your local pharmacy: Shingles  Health Maintenance  Topic Date Due   Medicare Annual Wellness Visit  Never done   HIV Screening  Never done   Hepatitis C Screening  Never done   COVID-19 Vaccine (7 - 2025-26 season) 07/14/2024   Pneumococcal Vaccine for age over 11 (2 of 2 - PCV) 12/24/2024*   Screening for Lung Cancer  06/02/2025   Colon Cancer Screening  12/01/2025   Breast Cancer Screening  04/16/2026   Pap with HPV screening  05/18/2026   DTaP/Tdap/Td vaccine (4 - Td or Tdap) 12/14/2029   Flu Shot  Completed   DEXA scan (bone density measurement)  Completed   Zoster (Shingles) Vaccine  Completed   Hepatitis B Vaccine  Aged Out   Meningitis B Vaccine  Aged Out  *Topic was postponed. The date shown is not the original due date.       10/17/2023    8:53 AM  Advanced Directives  Does Patient Have a Medical Advance Directive? Yes  Type of Estate Agent of [place];Living will  Copy of Healthcare Power of Attorney in Chart? No - copy requested   Bring a copy of your health care power of attorney and living will to the office to be added to your chart at your convenience. You can mail a copy to [facility] [address] [place], [place] [address] or email to [email or web address]   Vision: Annual vision screenings are recommended for early detection of glaucoma, cataracts, and diabetic retinopathy. These exams can also reveal signs of chronic conditions such as diabetes and high blood pressure.  Dental: Annual dental screenings help detect early signs of oral cancer, gum disease, and other conditions linked to overall health, including heart disease and diabetes.  Please see the attached documents for additional preventive care recommendations.

## 2024-09-17 NOTE — Progress Notes (Signed)
 I connected with  Kristin Benjamin on 09/17/24 by a audio enabled telemedicine application and verified that I am speaking with the correct person using two identifiers.  Patient Location: Home  Provider Location: Office/Clinic  I discussed the limitations of evaluation and management by telemedicine. The patient expressed understanding and agreed to proceed.  Subjective:   Kristin Benjamin is a 68 y.o. female who presents for a Medicare Annual Wellness Visit.  Allergies (verified) Patient has no known allergies.   History: Past Medical History:  Diagnosis Date   Allergy    Anxiety    Arthritis    Depression    Headache    history of   Heart murmur    in the past   Hx of adenomatous colonic polyps 07/17/2017   Hyperlipidemia    Hypertension    Past Surgical History:  Procedure Laterality Date   bladder tack     COLONOSCOPY  2006   and 2018   FUNCTIONAL ENDOSCOPIC SINUS SURGERY     JOINT REPLACEMENT     Left total knee arthroplasty Dr. Vernetta 10-18-18   KNEE ARTHROSCOPY W/ MENISCAL REPAIR Right 08/2016   TOTAL KNEE ARTHROPLASTY Left 10/18/2018   Procedure: LEFT TOTAL KNEE ARTHROPLASTY;  Surgeon: Vernetta Lonni GRADE, MD;  Location: [facility];  Service: Orthopedics;  Laterality: Left;   TOTAL KNEE ARTHROPLASTY Right 09/28/2023   Procedure: RIGHT TOTAL KNEE ARTHROPLASTY;  Surgeon: Vernetta Lonni GRADE, MD;  Location: [facility];  Service: Orthopedics;  Laterality: Right;   TUBAL LIGATION  1993   Family History  Problem Relation Age of Onset   Hyperlipidemia Mother    Arthritis Mother    Depression Father    Hyperlipidemia Father    Diabetes Father    Alcohol abuse Father    Arthritis Father    Arthritis Sister    Hyperlipidemia Sister    Arthritis Sister    Depression Sister    Hyperlipidemia Sister    Hyperlipidemia Maternal Grandmother    Arthritis Maternal Grandmother    Stroke Maternal Grandfather    Hyperlipidemia Maternal Grandfather    Arthritis  Maternal Grandfather    Hyperlipidemia Paternal Grandmother    Arthritis Paternal Grandmother    Hyperlipidemia Paternal Grandfather    Arthritis Paternal Grandfather    Colon cancer Neg Hx    Esophageal cancer Neg Hx    Rectal cancer Neg Hx    Stomach cancer Neg Hx    Colon polyps Neg Hx    Social History   Occupational History   Occupation: retired  Tobacco Use   Smoking status: Former    Current packs/day: 1.00    Average packs/day: 1 pack/day for 42.0 years (42.0 ttl pk-yrs)    Types: Cigarettes   Smokeless tobacco: Never   Tobacco comments:    Patient is interested in utilizing Wellbutrin  and Nicotine patchs again  Vaping Use   Vaping status: Never Used  Substance and Sexual Activity   Alcohol use: Yes    Alcohol/week: 3.0 - 5.0 standard drinks of alcohol    Types: 3 - 5 Glasses of wine per week    Comment: 3-5 wine per week per pt   Drug use: Yes    Types: Marijuana    Comment: occasionally--monthly   Sexual activity: Yes   Tobacco Counseling Counseling given: Not Answered Tobacco comments: Patient is interested in utilizing Wellbutrin  and Nicotine patchs again  SDOH Screenings   Food Insecurity: No Food Insecurity (09/15/2024)  Housing: Low Risk  (  09/15/2024)  Transportation Needs: No Transportation Needs (09/15/2024)  Utilities: Not At Risk (09/17/2024)  Alcohol Screen: Low Risk  (09/15/2024)  Depression (PHQ2-9): Low Risk  (09/17/2024)  Financial Resource Strain: Low Risk  (09/15/2024)  Physical Activity: Sufficiently Active (09/15/2024)  Social Connections: Moderately Integrated (09/15/2024)  Stress: Stress Concern Present (09/15/2024)  Tobacco Use: Medium Risk (09/17/2024)   Depression Screen    09/17/2024    1:14 PM 12/25/2023    9:56 AM 09/18/2023    8:17 AM 03/19/2023    8:52 AM 09/26/2022    9:17 AM 04/27/2022    8:46 AM  PHQ 2/9 Scores  PHQ - 2 Score 1 0 0 3 0 0  PHQ- 9 Score 2 3  4        Goals Addressed   None    Visit info / Clinical  Intake: Medicare Wellness Visit Type:: Initial Annual Wellness Visit Medicare Wellness Visit Mode:: Telephone If telephone:: video declined If telephone or video:: pt reported vitals Interpreter Needed?: No Pre-visit prep was completed: yes AWV questionnaire completed by patient prior to visit?: yes Date:: 09/15/24 Living arrangements:: lives with spouse/significant other Patient's Overall Health Status Rating: very good Typical amount of pain: none Does pain affect daily life?: no Are you currently prescribed opioids?: no  Dietary Habits and Nutritional Risks How many meals a day?: 2 Eats fruit and vegetables daily?: yes Most meals are obtained by: preparing own meals Diabetic:: no  Functional Status Activities of Daily Living (to include ambulation/medication): (Patient-Rptd) Independent Ambulation: (Patient-Rptd) Independent Medication Administration: Independent Home Management: (Patient-Rptd) Independent Manage your own finances?: yes Primary transportation is: driving Concerns about vision?: no *vision screening is required for WTM* Concerns about hearing?: no  Fall Screening Falls in the past year?: (Patient-Rptd) 0 Number of falls in past year: (Patient-Rptd) 0 Was there an injury with Fall?: (Patient-Rptd) 0 Fall Risk Category Calculator: (Patient-Rptd) 0 Patient Fall Risk Level: (Patient-Rptd) Low Fall Risk  Fall Risk Patient at Risk for Falls Due to: No Fall Risks Fall risk Follow up: Falls evaluation completed  Home and Transportation Safety: All rugs have non-skid backing?: yes All stairs or steps have railings?: yes Grab bars in the bathtub or shower?: yes Have non-skid surface in bathtub or shower?: (!) no Good home lighting?: yes Regular seat belt use?: yes Hospital stays in the last year:: (!) no; yes How many hospital stays:: 1 Reason: knee replacement  Cognitive Assessment Difficulty concentrating, remembering, or making decisions? : no  (forgets a word every now and then) Will 6CIT or Mini Cog be Completed: yes What year is it?: 0 points What month is it?: 0 points Give patient an address phrase to remember (5 components): [address], [place]  About what time is it?: 0 points Count backwards from 20 to 1: 0 points Say the months of the year in reverse: 0 points Repeat the address phrase from earlier: 0 points 6 CIT Score: 0 points  Advance Directives (For Healthcare) Does Patient Have a Medical Advance Directive?: Yes Does patient want to make changes to medical advance directive?: No - Guardian declined Type of Advance Directive: Healthcare Power of [place]; Living will Copy of Healthcare Power of Attorney in Chart?: No - copy requested Copy of Living Will in Chart?: No - copy requested Would patient like information on creating a medical advance directive?: No - Patient declined  Reviewed/Updated  Reviewed/Updated: All        Objective:    Today's Vitals   09/17/24 1301  Weight: 178 lb (80.7 kg)  Height: 5' 8 (1.727 m)   Body mass index is 27.06 kg/m.  Current Medications (verified) Outpatient Encounter Medications as of 09/17/2024  Medication Sig   atorvastatin  (LIPITOR) 80 MG tablet Take 1 tablet (80 mg total) by mouth daily.   buPROPion  (WELLBUTRIN  SR) 150 MG 12 hr tablet Take 1 tablet (150 mg total) by mouth daily.   cetirizine (ZYRTEC) 10 MG tablet Take 10 mg by mouth in the morning.   estradiol  (ESTRACE  VAGINAL) 0.1 MG/GM vaginal cream Place 1 Applicatorful vaginally every other day. At night.   ezetimibe (ZETIA) 10 MG tablet Take 1 tablet (10 mg total) by mouth daily.   fluticasone  (FLONASE ) 50 MCG/ACT nasal spray Place 2 sprays into both nostrils daily.   meloxicam  (MOBIC ) 15 MG tablet Take 1 tablet (15 mg total) by mouth daily.   sertraline  (ZOLOFT ) 50 MG tablet Take 1 tablet (50 mg total) by mouth daily.   SUMAtriptan  (IMITREX ) 100 MG tablet Take 100 mg by mouth every 2 (two) hours  as needed.   traZODone  (DESYREL ) 50 MG tablet Take 0.5-1 tablets (25-50 mg total) by mouth at bedtime as needed for sleep.   trimethoprim  (TRIMPEX ) 100 MG tablet TAKE 1 TABLET DAILY AS NEEDED AFTER INTERCOURSE   valsartan -hydrochlorothiazide  (DIOVAN -HCT) 160-12.5 MG tablet TAKE 1 TABLET DAILY   [DISCONTINUED] CANNABIDIOL PO Take 750 mg by mouth in the morning. CBD   [DISCONTINUED] sulfamethoxazole -trimethoprim  (BACTRIM  DS) 800-160 MG tablet Take 1 tablet by mouth 2 (two) times daily.   No facility-administered encounter medications on file as of 09/17/2024.   Hearing/Vision screen Hearing Screening - Comments:: Denies hearing difficulties.  Vision Screening - Comments:: Up to date with routine eye exams with [facility] Immunizations and Health Maintenance Health Maintenance  Topic Date Due   HIV Screening  Never done   Hepatitis C Screening  Never done   COVID-19 Vaccine (7 - 2025-26 season) 07/14/2024   Pneumococcal Vaccine: 50+ Years (2 of 2 - PCV) 12/24/2024 (Originally 09/23/2015)   Lung Cancer Screening  06/02/2025   Medicare Annual Wellness (AWV)  09/17/2025   Colonoscopy  12/01/2025   Mammogram  04/16/2026   Cervical Cancer Screening (HPV/Pap Cotest)  05/18/2026   DTaP/Tdap/Td (4 - Td or Tdap) 12/14/2029   Influenza Vaccine  Completed   DEXA SCAN  Completed   Zoster Vaccines- Shingrix  Completed   Hepatitis B Vaccines 19-59 Average Risk  Aged Out   Meningococcal B Vaccine  Aged Out        Assessment/Plan:  This is a routine wellness examination for Kristin Benjamin.  Patient Care Team: [name] NOVAK, NP as PCP - General (Family Medicine) Delford Maude BROCKS, MD as PCP - Cardiology (Cardiology)  I have personally reviewed and noted the following in the patient's chart:   Medical and social history Use of alcohol, tobacco or illicit drugs  Current medications and supplements including opioid prescriptions. Functional ability and status Nutritional status Physical  activity Advanced directives List of other physicians Hospitalizations, surgeries, and ER visits in previous 12 months Vitals Screenings to include cognitive, depression, and falls Referrals and appointments  No orders of the defined types were placed in this encounter.  In addition, I have reviewed and discussed with patient certain preventive protocols, quality metrics, and best practice recommendations. A written personalized care plan for preventive services as well as general preventive health recommendations were provided to patient.   Kristin Benjamin, CMA   09/17/2024   Return in 1  year (on 09/17/2025).  After Visit Summary: (MyChart) Due to this being a telephonic visit, the after visit summary with patients personalized plan was offered to patient via MyChart   Nurse Notes: nothing significant to report

## 2024-09-23 NOTE — Progress Notes (Signed)
 I connected with  Kristin Benjamin on 09/17/24 by a audio enabled telemedicine application and verified that I am speaking with the correct person using two identifiers.  Patient Location: Home  Provider Location: Office/Clinic  I discussed the limitations of evaluation and management by telemedicine. The patient expressed understanding and agreed to proceed.  Subjective:   Kristin Benjamin is a 68 y.o. female who presents for a Medicare Annual Wellness Visit.  Allergies (verified) Patient has no known allergies.   History: Past Medical History:  Diagnosis Date   Allergy    Anxiety    Arthritis    Depression    Headache    history of   Heart murmur    in the past   Hx of adenomatous colonic polyps 07/17/2017   Hyperlipidemia    Hypertension    Past Surgical History:  Procedure Laterality Date   bladder tack     COLONOSCOPY  2006   and 2018   FUNCTIONAL ENDOSCOPIC SINUS SURGERY     JOINT REPLACEMENT     Left total knee arthroplasty Dr. Vernetta 10-18-18   KNEE ARTHROSCOPY W/ MENISCAL REPAIR Right 08/2016   TOTAL KNEE ARTHROPLASTY Left 10/18/2018   Procedure: LEFT TOTAL KNEE ARTHROPLASTY;  Surgeon: Vernetta Lonni GRADE, MD;  Location: [facility];  Service: Orthopedics;  Laterality: Left;   TOTAL KNEE ARTHROPLASTY Right 09/28/2023   Procedure: RIGHT TOTAL KNEE ARTHROPLASTY;  Surgeon: Vernetta Lonni GRADE, MD;  Location: [facility];  Service: Orthopedics;  Laterality: Right;   TUBAL LIGATION  1993   Family History  Problem Relation Age of Onset   Hyperlipidemia Mother    Arthritis Mother    Depression Father    Hyperlipidemia Father    Diabetes Father    Alcohol abuse Father    Arthritis Father    Arthritis Sister    Hyperlipidemia Sister    Arthritis Sister    Depression Sister    Hyperlipidemia Sister    Hyperlipidemia Maternal Grandmother    Arthritis Maternal Grandmother    Stroke Maternal Grandfather    Hyperlipidemia Maternal Grandfather    Arthritis  Maternal Grandfather    Hyperlipidemia Paternal Grandmother    Arthritis Paternal Grandmother    Hyperlipidemia Paternal Grandfather    Arthritis Paternal Grandfather    Colon cancer Neg Hx    Esophageal cancer Neg Hx    Rectal cancer Neg Hx    Stomach cancer Neg Hx    Colon polyps Neg Hx    Social History   Occupational History   Occupation: retired  Tobacco Use   Smoking status: Former    Current packs/day: 1.00    Average packs/day: 1 pack/day for 42.0 years (42.0 ttl pk-yrs)    Types: Cigarettes   Smokeless tobacco: Never   Tobacco comments:    Patient is interested in utilizing Wellbutrin  and Nicotine patchs again  Vaping Use   Vaping status: Never Used  Substance and Sexual Activity   Alcohol use: Yes    Alcohol/week: 3.0 - 5.0 standard drinks of alcohol    Types: 3 - 5 Glasses of wine per week    Comment: 3-5 wine per week per pt   Drug use: Yes    Types: Marijuana    Comment: occasionally--monthly   Sexual activity: Yes   Tobacco Counseling Counseling given: Not Answered Tobacco comments: Patient is interested in utilizing Wellbutrin  and Nicotine patchs again  SDOH Screenings   Food Insecurity: No Food Insecurity (09/15/2024)  Housing: Low Risk  (  09/15/2024)  Transportation Needs: No Transportation Needs (09/15/2024)  Utilities: Not At Risk (09/17/2024)  Alcohol Screen: Low Risk  (09/15/2024)  Depression (PHQ2-9): Low Risk  (09/17/2024)  Financial Resource Strain: Low Risk  (09/15/2024)  Physical Activity: Sufficiently Active (09/15/2024)  Social Connections: Moderately Integrated (09/15/2024)  Stress: Stress Concern Present (09/15/2024)  Tobacco Use: Medium Risk (09/17/2024)   Depression Screen    09/17/2024    1:14 PM 12/25/2023    9:56 AM 09/18/2023    8:17 AM 03/19/2023    8:52 AM 09/26/2022    9:17 AM 04/27/2022    8:46 AM  PHQ 2/9 Scores  PHQ - 2 Score 1 0 0 3 0 0  PHQ- 9 Score 2  3   4         Data saved with a previous flowsheet row definition      Goals Addressed   None    Visit info / Clinical Intake: Medicare Wellness Visit Type:: Initial Annual Wellness Visit Persons participating in visit:: patient Medicare Wellness Visit Mode:: Telephone If telephone:: video declined Because this visit was a virtual/telehealth visit:: pt reported vitals Information given by:: patient Interpreter Needed?: No Pre-visit prep was completed: yes AWV questionnaire completed by patient prior to visit?: yes Date:: 09/15/24 Living arrangements:: lives with spouse/significant other Patient's Overall Health Status Rating: very good Typical amount of pain: none Does pain affect daily life?: no Are you currently prescribed opioids?: no  Dietary Habits and Nutritional Risks How many meals a day?: 2 Eats fruit and vegetables daily?: yes Most meals are obtained by: preparing own meals In the last 2 weeks, have you had any of the following?: none Diabetic:: no  Functional Status Activities of Daily Living (to include ambulation/medication): (Patient-Rptd) Independent Ambulation: (Patient-Rptd) Independent Medication Administration: Independent Home Management: (Patient-Rptd) Independent Manage your own finances?: yes Primary transportation is: driving Concerns about vision?: no *vision screening is required for WTM* Concerns about hearing?: no  Fall Screening Falls in the past year?: (Patient-Rptd) 0 Number of falls in past year: (Patient-Rptd) 0 Was there an injury with Fall?: (Patient-Rptd) 0 Fall Risk Category Calculator: (Patient-Rptd) 0 Patient Fall Risk Level: (Patient-Rptd) Low Fall Risk  Fall Risk Patient at Risk for Falls Due to: No Fall Risks Fall risk Follow up: Falls evaluation completed  Home and Transportation Safety: All rugs have non-skid backing?: yes All stairs or steps have railings?: yes Grab bars in the bathtub or shower?: yes Have non-skid surface in bathtub or shower?: (!) no Good home lighting?: yes Regular  seat belt use?: yes Hospital stays in the last year:: (!) no; yes How many hospital stays:: 1 Reason: knee replacement  Cognitive Assessment Difficulty concentrating, remembering, or making decisions? : no (forgets a word every now and then) Will 6CIT or Mini Cog be Completed: yes What year is it?: 0 points What month is it?: 0 points Give patient an address phrase to remember (5 components): [address], [place]  About what time is it?: 0 points Count backwards from 20 to 1: 0 points Say the months of the year in reverse: 0 points Repeat the address phrase from earlier: 0 points 6 CIT Score: 0 points  Advance Directives (For Healthcare) Does Patient Have a Medical Advance Directive?: Yes Does patient want to make changes to medical advance directive?: No - Guardian declined Type of Advance Directive: Healthcare Power of [place]; Living will Copy of Healthcare Power of Attorney in Chart?: No - copy requested Copy of Living Will in Chart?: No -  copy requested Would patient like information on creating a medical advance directive?: No - Patient declined  Reviewed/Updated  Reviewed/Updated: All        Objective:    Today's Vitals   09/17/24 1301  Weight: 178 lb (80.7 kg)  Height: 5' 8 (1.727 m)   Body mass index is 27.06 kg/m.  Current Medications (verified) Outpatient Encounter Medications as of 09/17/2024  Medication Sig   atorvastatin  (LIPITOR) 80 MG tablet Take 1 tablet (80 mg total) by mouth daily.   buPROPion  (WELLBUTRIN  SR) 150 MG 12 hr tablet Take 1 tablet (150 mg total) by mouth daily.   cetirizine (ZYRTEC) 10 MG tablet Take 10 mg by mouth in the morning.   estradiol  (ESTRACE  VAGINAL) 0.1 MG/GM vaginal cream Place 1 Applicatorful vaginally every other day. At night.   ezetimibe (ZETIA) 10 MG tablet Take 1 tablet (10 mg total) by mouth daily.   fluticasone  (FLONASE ) 50 MCG/ACT nasal spray Place 2 sprays into both nostrils daily.   meloxicam  (MOBIC ) 15  MG tablet Take 1 tablet (15 mg total) by mouth daily.   sertraline  (ZOLOFT ) 50 MG tablet Take 1 tablet (50 mg total) by mouth daily.   SUMAtriptan  (IMITREX ) 100 MG tablet Take 100 mg by mouth every 2 (two) hours as needed.   trimethoprim  (TRIMPEX ) 100 MG tablet TAKE 1 TABLET DAILY AS NEEDED AFTER INTERCOURSE   valsartan -hydrochlorothiazide  (DIOVAN -HCT) 160-12.5 MG tablet TAKE 1 TABLET DAILY   [DISCONTINUED] traZODone  (DESYREL ) 50 MG tablet Take 0.5-1 tablets (25-50 mg total) by mouth at bedtime as needed for sleep.   [DISCONTINUED] CANNABIDIOL PO Take 750 mg by mouth in the morning. CBD   [DISCONTINUED] sulfamethoxazole -trimethoprim  (BACTRIM  DS) 800-160 MG tablet Take 1 tablet by mouth 2 (two) times daily.   No facility-administered encounter medications on file as of 09/17/2024.   Hearing/Vision screen Hearing Screening - Comments:: Denies hearing difficulties.  Vision Screening - Comments:: Up to date with routine eye exams with [facility] Immunizations and Health Maintenance Health Maintenance  Topic Date Due   HIV Screening  Never done   Hepatitis C Screening  Never done   COVID-19 Vaccine (7 - 2025-26 season) 07/14/2024   Pneumococcal Vaccine: 50+ Years (2 of 2 - PCV) 12/24/2024 (Originally 09/23/2015)   Lung Cancer Screening  06/02/2025   Medicare Annual Wellness (AWV)  09/17/2025   Colonoscopy  12/01/2025   Mammogram  04/16/2026   Cervical Cancer Screening (HPV/Pap Cotest)  05/18/2026   DTaP/Tdap/Td (4 - Td or Tdap) 12/14/2029   Influenza Vaccine  Completed   DEXA SCAN  Completed   Zoster Vaccines- Shingrix  Completed   Hepatitis B Vaccines 19-59 Average Risk  Aged Out   Meningococcal B Vaccine  Aged Out        Assessment/Plan:  This is a routine wellness examination for Pariss.  Patient Care Team: [name] NOVAK, NP as PCP - General (Family Medicine) Delford Maude BROCKS, MD as PCP - Cardiology (Cardiology) [facility]) [facility]  Of  I have personally reviewed and noted the following in the patient's chart:   Medical and social history Use of alcohol, tobacco or illicit drugs  Current medications and supplements including opioid prescriptions. Functional ability and status Nutritional status Physical activity Advanced directives List of other physicians Hospitalizations, surgeries, and ER visits in previous 12 months Vitals Screenings to include cognitive, depression, and falls Referrals and appointments  No orders of the defined types were placed in this encounter.  In addition, I  have reviewed and discussed with patient certain preventive protocols, quality metrics, and best practice recommendations. A written personalized care plan for preventive services as well as general preventive health recommendations were provided to patient.   Lolita Libra, CMA   09/23/2024   Return in 1 year (on 09/17/2025).  After Visit Summary: (MyChart) Due to this being a telephonic visit, the after visit summary with patients personalized plan was offered to patient via MyChart   Nurse Notes: nothing significant to report

## 2024-09-25 ENCOUNTER — Ambulatory Visit: Payer: Self-pay | Admitting: *Deleted

## 2024-09-25 NOTE — Telephone Encounter (Signed)
 FYI Only or Action Required?: FYI only for provider: appointment scheduled on 09/26/24.  Patient was last seen in primary care on 06/26/2024 by Wheeler Harlene CROME, NP.  Called Nurse Triage reporting Urinary Frequency.  Symptoms began a week ago.  Interventions attempted: Rest, hydration, or home remedies.  Symptoms are: gradually worsening.  Triage Disposition: See Physician Within 24 Hours  Patient/caregiver understands and will follow disposition?: Yes     Copied from CRM #[id number]. Topic: Clinical - Red Word Triage >> Sep 25, 2024 10:44 AM Viola F wrote: Patient having stomach pain and frequent urination - requested appt to test urine for UTI Reason for Disposition  More than 2 UTI's in last year  Answer Assessment - Initial Assessment Questions No available appt with PCP. Scheduled appt with other provider in office tomorrow. Recommended if sx worsen go to ED.        1. SEVERITY: How bad is the pain?  (e.g., Scale 1-10; mild, moderate, or severe)     6/10 2. FREQUENCY: How many times have you had painful urination today?      Increasing over past week to 10 days  3. PATTERN: Is pain present every time you urinate or just sometimes?      Na  4. ONSET: When did the painful urination start?      Week to 10 days  5. FEVER: Do you have a fever? If Yes, ask: What is your temperature, how was it measured, and when did it start?     No  6. PAST UTI: Have you had a urine infection before? If Yes, ask: When was the last time? and What happened that time?      Yes in August and Sept. This year 7. CAUSE: What do you think is causing the painful urination?  (e.g., UTI, scratch, Herpes sore)     UTI 8. OTHER SYMPTOMS: Do you have any other symptoms? (e.g., blood in urine, flank pain, genital sores, urgency, vaginal discharge)     Lower abdominal discomfort 6/10 . Frequency urgency urinating.  9. PREGNANCY: Is there any chance you are pregnant? When was  your last menstrual period?     na  Protocols used: Urination Pain - Female-A-AH

## 2024-09-26 ENCOUNTER — Ambulatory Visit: Admitting: Medical

## 2024-09-26 VITALS — BP 110/78 | HR 96 | Temp 98.3°F | Resp 15 | Ht 68.0 in | Wt 179.0 lb

## 2024-09-26 DIAGNOSIS — R300 Dysuria: Secondary | ICD-10-CM

## 2024-09-26 DIAGNOSIS — R350 Frequency of micturition: Secondary | ICD-10-CM | POA: Diagnosis not present

## 2024-09-26 LAB — POCT URINALYSIS DIPSTICK
Bilirubin, UA: NEGATIVE
Blood, UA: NEGATIVE
Glucose, UA: NEGATIVE
Leukocytes, UA: NEGATIVE
Nitrite, UA: NEGATIVE
Protein, UA: NEGATIVE
Spec Grav, UA: 1.025 (ref 1.010–1.025)
Urobilinogen, UA: 0.2 U/dL
pH, UA: 5 (ref 5.0–8.0)

## 2024-09-26 MED ORDER — CEPHALEXIN 500 MG PO CAPS: 500.0000 mg | ORAL_CAPSULE | Freq: Two times a day (BID) | ORAL | 0 refills | Status: DC

## 2024-09-26 NOTE — Patient Instructions (Addendum)
 Recurrent urinary tractions history and recurrent uti symptoms just recently. Symptoms and history suggest possible UTI. Awaiting culture results for confirmation and antibiotic guidance. - Keflex  pending C and S results -hydrate well. -Can azo standard otc - Await culture results for bacterial growth and sensitivity. - Update her on culture results and adjust antibiotics if necessary. - Sent prescription to [facility].   Follow up date to be determined after lab review

## 2024-09-26 NOTE — Progress Notes (Signed)
 Subjective:    Patient ID: Kristin Benjamin, female    DOB: May 06, 1956, 68 y.o.   MRN: [id number]  HPI Discussed the use of AI scribe software for clinical note transcription with the patient, who gave verbal consent to proceed.  History of Present Illness   Kristin Benjamin is a 68 year old female who presents with symptoms suggestive of a urinary tract infection.  She experiences lower abdominal/suprapubic discomfort and frequent urination with urgency for about ten days. Urination is mild hesitant at times. There is no pain over the kidneys, back pain, fevers, chills, or sweats.  She has recurrent urinary tract infections, with two episodes treated in August and September with antibiotics. Previous cultures showed E. coli and Klebsiella, while some showed no growth.  She uses trimethoprim  100 mg after intercourse and applies estrogen vaginal cream every other day to prevent UTIs. She has no allergies.              Review of Systems  Constitutional:  Negative for chills, fatigue and fever.  Respiratory:  Negative for cough, chest tightness and wheezing.   Cardiovascular:  Negative for chest pain and palpitations.  Gastrointestinal: Negative.  Negative for abdominal pain and blood in stool.  Genitourinary:  Positive for frequency and urgency.       Some urinary hesitancy and suprapubic pressure.  Musculoskeletal:  Negative for back pain.  Skin:  Negative for rash.  Psychiatric/Behavioral:  Negative for behavioral problems.     Past Medical History:  Diagnosis Date   Allergy    Anxiety    Arthritis    Depression    Headache    history of   Heart murmur    in the past   Hx of adenomatous colonic polyps 07/17/2017   Hyperlipidemia    Hypertension      Social History   Socioeconomic History   Marital status: Married    Spouse name: Not on file   Number of children: Not on file   Years of education: Not on file   Highest education level: Associate degree:  occupational, scientist, product/process development, or vocational program  Occupational History   Occupation: retired  Tobacco Use   Smoking status: Former    Current packs/day: 1.00    Average packs/day: 1 pack/day for 42.0 years (42.0 ttl pk-yrs)    Types: Cigarettes   Smokeless tobacco: Never   Tobacco comments:    Patient is interested in utilizing Wellbutrin  and Nicotine patchs again  Vaping Use   Vaping status: Never Used  Substance and Sexual Activity   Alcohol use: Yes    Alcohol/week: 3.0 - 5.0 standard drinks of alcohol    Types: 3 - 5 Glasses of wine per week    Comment: 3-5 wine per week per pt   Drug use: Yes    Types: Marijuana    Comment: occasionally--monthly   Sexual activity: Yes  Other Topics Concern   Not on file  Social History Narrative   Not on file   Social Drivers of Health   Financial Resource Strain: Low Risk  (09/15/2024)   Overall Financial Resource Strain (CARDIA)    Difficulty of Paying Living Expenses: Not very hard  Food Insecurity: No Food Insecurity (09/15/2024)   Hunger Vital Sign    Worried About Running Out of Food in the Last Year: Never true    Ran Out of Food in the Last Year: Never true  Transportation Needs: No Transportation Needs (09/15/2024)   PRAPARE -  Administrator, Civil Service (Medical): No    Lack of Transportation (Non-Medical): No  Physical Activity: Sufficiently Active (09/15/2024)   Exercise Vital Sign    Days of Exercise per Week: 4 days    Minutes of Exercise per Session: 120 min  Stress: Stress Concern Present (09/15/2024)   [employer] of Occupational Health - Occupational Stress Questionnaire    Feeling of Stress: To some extent  Social Connections: Moderately Integrated (09/15/2024)   Social Connection and Isolation Panel    Frequency of Communication with Friends and Family: Twice a week    Frequency of Social Gatherings with Friends and Family: Once a week    Attends Religious Services: Never    Doctor, General Practice or Organizations: Yes    Attends Engineer, Structural: More than 4 times per year    Marital Status: Married  Catering Manager Violence: Not At Risk (09/17/2024)   Humiliation, Afraid, Rape, and Kick questionnaire    Fear of Current or Ex-Partner: No    Emotionally Abused: No    Physically Abused: No    Sexually Abused: No    Past Surgical History:  Procedure Laterality Date   bladder tack     COLONOSCOPY  2006   and 2018   FUNCTIONAL ENDOSCOPIC SINUS SURGERY     JOINT REPLACEMENT     Left total knee arthroplasty Dr. Vernetta 10-18-18   KNEE ARTHROSCOPY W/ MENISCAL REPAIR Right 08/2016   TOTAL KNEE ARTHROPLASTY Left 10/18/2018   Procedure: LEFT TOTAL KNEE ARTHROPLASTY;  Surgeon: Vernetta Lonni GRADE, MD;  Location: [facility];  Service: Orthopedics;  Laterality: Left;   TOTAL KNEE ARTHROPLASTY Right 09/28/2023   Procedure: RIGHT TOTAL KNEE ARTHROPLASTY;  Surgeon: Vernetta Lonni GRADE, MD;  Location: [facility];  Service: Orthopedics;  Laterality: Right;   TUBAL LIGATION  1993    Family History  Problem Relation Age of Onset   Hyperlipidemia Mother    Arthritis Mother    Depression Father    Hyperlipidemia Father    Diabetes Father    Alcohol abuse Father    Arthritis Father    Arthritis Sister    Hyperlipidemia Sister    Arthritis Sister    Depression Sister    Hyperlipidemia Sister    Hyperlipidemia Maternal Grandmother    Arthritis Maternal Grandmother    Stroke Maternal Grandfather    Hyperlipidemia Maternal Grandfather    Arthritis Maternal Grandfather    Hyperlipidemia Paternal Grandmother    Arthritis Paternal Grandmother    Hyperlipidemia Paternal Grandfather    Arthritis Paternal Grandfather    Colon cancer Neg Hx    Esophageal cancer Neg Hx    Rectal cancer Neg Hx    Stomach cancer Neg Hx    Colon polyps Neg Hx     No Known Allergies  Current Outpatient Medications on File Prior to Visit  Medication Sig Dispense Refill   atorvastatin   (LIPITOR) 80 MG tablet Take 1 tablet (80 mg total) by mouth daily. 90 tablet 3   buPROPion  (WELLBUTRIN  SR) 150 MG 12 hr tablet Take 1 tablet (150 mg total) by mouth daily. 90 tablet 3   cetirizine (ZYRTEC) 10 MG tablet Take 10 mg by mouth in the morning.     estradiol  (ESTRACE  VAGINAL) 0.1 MG/GM vaginal cream Place 1 Applicatorful vaginally every other day. At night. 42.5 g 1   ezetimibe (ZETIA) 10 MG tablet Take 1 tablet (10 mg total) by mouth daily. 90 tablet 3  fluticasone  (FLONASE ) 50 MCG/ACT nasal spray Place 2 sprays into both nostrils daily. 16 g 3   meloxicam  (MOBIC ) 15 MG tablet Take 1 tablet (15 mg total) by mouth daily. 30 tablet 3   sertraline  (ZOLOFT ) 50 MG tablet Take 1 tablet (50 mg total) by mouth daily. 90 tablet 1   SUMAtriptan  (IMITREX ) 100 MG tablet Take 100 mg by mouth every 2 (two) hours as needed.     traZODone  (DESYREL ) 50 MG tablet Take 0.5-1 tablets (25-50 mg total) by mouth at bedtime as needed for sleep. 90 tablet 0   trimethoprim  (TRIMPEX ) 100 MG tablet TAKE 1 TABLET DAILY AS NEEDED AFTER INTERCOURSE 20 tablet 17   valsartan -hydrochlorothiazide  (DIOVAN -HCT) 160-12.5 MG tablet TAKE 1 TABLET DAILY 90 tablet 3   No current facility-administered medications on file prior to visit.    BP 110/78   Pulse 96   Temp 98.3 F (36.8 C) (Oral)   Resp 15   Ht 5' 8 (1.727 m)   Wt 179 lb (81.2 kg)   SpO2 97%   BMI 27.22 kg/m        Objective:   Physical Exam  General- No acute distress. Pleasant patient. Neck- Full range of motion, no jvd Lungs- Clear, even and unlabored. Heart- regular rate and rhythm. Neurologic- CNII- XII grossly intact.  Back- no cva tenderness. Abdomen- soft, nd, +bs, no rebound or guarding. Mild suprapubic tender      Assessment & Plan:  Assessment and Plan     Patient Instructions  Recurrent urinary tractions history and recurrent uti symptoms just recently. Symptoms and history suggest possible UTI. Awaiting culture results for  confirmation and antibiotic guidance. - Keflex  pending C and S results -hydrate well. -Can azo standard otc - Await culture results for bacterial growth and sensitivity. - Update her on culture results and adjust antibiotics if necessary. - Sent prescription to [facility].   Follow up date to be determined after lab review

## 2024-09-27 ENCOUNTER — Ambulatory Visit: Payer: Self-pay | Admitting: Medical

## 2024-09-27 LAB — URINE CULTURE
MICRO NUMBER:: 17236945
SPECIMEN QUALITY:: ADEQUATE

## 2024-09-28 ENCOUNTER — Encounter: Payer: Self-pay | Admitting: Medical

## 2024-10-16 DIAGNOSIS — L814 Other melanin hyperpigmentation: Secondary | ICD-10-CM | POA: Diagnosis not present

## 2024-10-16 DIAGNOSIS — L821 Other seborrheic keratosis: Secondary | ICD-10-CM | POA: Diagnosis not present

## 2024-10-16 DIAGNOSIS — L570 Actinic keratosis: Secondary | ICD-10-CM | POA: Diagnosis not present

## 2024-10-20 ENCOUNTER — Encounter: Payer: Self-pay | Admitting: Family Medicine

## 2024-10-20 DIAGNOSIS — E782 Mixed hyperlipidemia: Secondary | ICD-10-CM

## 2024-10-20 MED ORDER — ATORVASTATIN CALCIUM 80 MG PO TABS: 80.0000 mg | ORAL_TABLET | Freq: Every day | ORAL | 0 refills | Status: DC

## 2024-10-22 ENCOUNTER — Ambulatory Visit: Payer: Self-pay

## 2024-10-22 NOTE — Telephone Encounter (Signed)
 Appt scheduled

## 2024-10-22 NOTE — Telephone Encounter (Signed)
 FYI Only or Action Required?: FYI only for provider: appointment scheduled on 10/24/24.  Patient was last seen in primary care on 09/26/2024 by Dorina Loving, PA-C.  Called Nurse Triage reporting Cyst.  Symptoms began a week ago.  Interventions attempted: Nothing.  Symptoms are: unchanged.  Triage Disposition: See PCP When Office is Open (Within 3 Days)  Patient/caregiver understands and will follow disposition?: Yes  Copied from CRM #[id number]. Topic: Clinical - Red Word Triage >> Oct 22, 2024  2:28 PM Frederich PARAS wrote: [facility] that prompted transfer to Nurse Triage: pain knott in pelvic  pt calling to schedule apt, she thinks he have a hernia, knot in pelvis, moderate pain Reason for Disposition  [1] New-onset hernia suspected (reducible bulge in groin or abdomen; non-tender) AND [2] NO pain or vomiting  Answer Assessment - Initial Assessment Questions No available appts today, scheduled pcp 10/24/24.  Advised call back or ED/911 if symptoms worsen. Pt verbalized understanding.  1. APPEARANCE of SWELLING: What does it look like?     no 2. SIZE: How large is the swelling? (e.g., inches, cm; or compare to size of pinhead, tip of pen, eraser, coin, pea, grape, ping pong ball)      Size of egg 3. LOCATION: Where is the swelling located?     Cyst is not protruding now, it comes and goes, right side groin 4. ONSET: When did the swelling start?     Couple weeks ago 5. COLOR: What color is it? Is there more than one color?     Same color of skin 6. PAIN: Is there any pain? If Yes, ask: How bad is the pain? (Scale 1-10; or mild, moderate, severe)       0 7. ITCH: Does it itch? If Yes, ask: How bad is the itch?      denies 8. CAUSE: What do you think caused the swelling?     No swelling, deines lifting heavy objects 9 OTHER SYMPTOMS: Do you have any other symptoms? (e.g., fever)     Denies diff breathing, injuries, chest pain, pulsating, pain,  numbness/weakness, Able to stand/ walk  Protocols used: Skin Lump or Localized Swelling-A-AH

## 2024-10-24 ENCOUNTER — Ambulatory Visit (INDEPENDENT_AMBULATORY_CARE_PROVIDER_SITE_OTHER): Admitting: Family Medicine

## 2024-10-24 ENCOUNTER — Encounter: Payer: Self-pay | Admitting: Family Medicine

## 2024-10-24 VITALS — BP 109/74 | HR 83 | Resp 16 | Ht 68.0 in | Wt 177.8 lb

## 2024-10-24 DIAGNOSIS — R1909 Other intra-abdominal and pelvic swelling, mass and lump: Secondary | ICD-10-CM | POA: Diagnosis not present

## 2024-10-24 NOTE — Progress Notes (Signed)
 Acute Office Visit  Subjective:  Patient ID: Kristin Benjamin, female    DOB: 1956/07/20  Age: 68 y.o. MRN: [id number]  CC:  Chief Complaint  Patient presents with   Cyst    Patient stated that she has some discomfort from a cyst/knot, right side groin area. Noticed it 2-3 weeks ago      HPI Kristin Benjamin is here for groin bulge/mass.   Discussed the use of AI scribe software for clinical note transcription with the patient, who gave verbal consent to proceed.  History of Present Illness Kristin Benjamin is a 68 year old female who presents with a palpable bulge in the abdominal area.  A few weeks ago, she noticed a bulge in her right groin area, particularly when in the shower. The bulge sometimes protrudes and sometimes does not, appearing to be the size of an 'egg'. It is not tender and does not cause pain. The bulge is visible when it protrudes, and her husband has also observed it. It can be pushed back in.  No recent unusual exercise, heavy lifting, rashes, or sickness. No history of pain, color changes, or other symptoms associated with the bulge.      Past Medical History:  Diagnosis Date   Allergy    Anxiety    Arthritis    Depression    Headache    history of   Heart murmur    in the past   Hx of adenomatous colonic polyps 07/17/2017   Hyperlipidemia    Hypertension     Past Surgical History:  Procedure Laterality Date   bladder tack     COLONOSCOPY  2006   and 2018   FUNCTIONAL ENDOSCOPIC SINUS SURGERY     JOINT REPLACEMENT     Left total knee arthroplasty Dr. Vernetta 10-18-18   KNEE ARTHROSCOPY W/ MENISCAL REPAIR Right 08/2016   TOTAL KNEE ARTHROPLASTY Left 10/18/2018   Procedure: LEFT TOTAL KNEE ARTHROPLASTY;  Surgeon: Vernetta Lonni GRADE, MD;  Location: [facility];  Service: Orthopedics;  Laterality: Left;   TOTAL KNEE ARTHROPLASTY Right 09/28/2023   Procedure: RIGHT TOTAL KNEE ARTHROPLASTY;  Surgeon: Vernetta Lonni GRADE, MD;  Location:  [facility];  Service: Orthopedics;  Laterality: Right;   TUBAL LIGATION  1993    Family History  Problem Relation Age of Onset   Hyperlipidemia Mother    Arthritis Mother    Depression Father    Hyperlipidemia Father    Diabetes Father    Alcohol abuse Father    Arthritis Father    Arthritis Sister    Hyperlipidemia Sister    Arthritis Sister    Depression Sister    Hyperlipidemia Sister    Hyperlipidemia Maternal Grandmother    Arthritis Maternal Grandmother    Stroke Maternal Grandfather    Hyperlipidemia Maternal Grandfather    Arthritis Maternal Grandfather    Hyperlipidemia Paternal Grandmother    Arthritis Paternal Grandmother    Hyperlipidemia Paternal Grandfather    Arthritis Paternal Grandfather    Colon cancer Neg Hx    Esophageal cancer Neg Hx    Rectal cancer Neg Hx    Stomach cancer Neg Hx    Colon polyps Neg Hx     Social History   Socioeconomic History   Marital status: Married    Spouse name: Not on file   Number of children: Not on file   Years of education: Not on file   Highest education level: Associate degree: occupational, scientist, product/process development, or vocational  program  Occupational History   Occupation: retired  Tobacco Use   Smoking status: Former    Current packs/day: 1.00    Average packs/day: 1 pack/day for 42.0 years (42.0 ttl pk-yrs)    Types: Cigarettes   Smokeless tobacco: Never   Tobacco comments:    Patient is interested in utilizing Wellbutrin  and Nicotine patchs again  Vaping Use   Vaping status: Never Used  Substance and Sexual Activity   Alcohol use: Yes    Alcohol/week: 3.0 - 5.0 standard drinks of alcohol    Types: 3 - 5 Glasses of wine per week    Comment: 3-5 wine per week per pt   Drug use: Yes    Types: Marijuana    Comment: occasionally--monthly   Sexual activity: Yes  Other Topics Concern   Not on file  Social History Narrative   Not on file   Social Drivers of Health   Tobacco Use: Medium Risk (10/24/2024)   Patient  History    Smoking Tobacco Use: Former    Smokeless Tobacco Use: Never    Passive Exposure: Not on file  Financial Resource Strain: Low Risk (09/15/2024)   Overall Financial Resource Strain (CARDIA)    Difficulty of Paying Living Expenses: Not very hard  Food Insecurity: No Food Insecurity (09/15/2024)   Epic    Worried About Programme Researcher, Broadcasting/film/video in the Last Year: Never true    Ran Out of Food in the Last Year: Never true  Transportation Needs: No Transportation Needs (09/15/2024)   Epic    Lack of Transportation (Medical): No    Lack of Transportation (Non-Medical): No  Physical Activity: Sufficiently Active (09/15/2024)   Exercise Vital Sign    Days of Exercise per Week: 4 days    Minutes of Exercise per Session: 120 min  Stress: Stress Concern Present (09/15/2024)   [employer] of Occupational Health - Occupational Stress Questionnaire    Feeling of Stress: To some extent  Social Connections: Moderately Integrated (09/15/2024)   Social Connection and Isolation Panel    Frequency of Communication with Friends and Family: Twice a week    Frequency of Social Gatherings with Friends and Family: Once a week    Attends Religious Services: Never    Database Administrator or Organizations: Yes    Attends Engineer, Structural: More than 4 times per year    Marital Status: Married  Catering Manager Violence: Not At Risk (09/17/2024)   Epic    Fear of Current or Ex-Partner: No    Emotionally Abused: No    Physically Abused: No    Sexually Abused: No  Depression (PHQ2-9): Low Risk (09/17/2024)   Depression (PHQ2-9)    PHQ-2 Score: 2  Alcohol Screen: Low Risk (09/15/2024)   Alcohol Screen    Last Alcohol Screening Score (AUDIT): 4  Housing: Low Risk (09/15/2024)   Epic    Unable to Pay for Housing in the Last Year: No    Number of Times Moved in the Last Year: 0    Homeless in the Last Year: No  Utilities: Not At Risk (09/17/2024)   Epic    Threatened with loss of utilities:  No  Health Literacy: Not on file    ROS All ROS negative except what is listed in the HPI.   Objective:   Today's Vitals: BP 109/74 (BP Location: Right Arm, Patient Position: Sitting, Cuff Size: Normal)   Pulse 83   Resp 16   Ht 5'  8 (1.727 m)   Wt 177 lb 12.8 oz (80.6 kg)   SpO2 96%   BMI 27.03 kg/m   Physical Exam Vitals reviewed.  Constitutional:      General: She is not in acute distress.    Appearance: Normal appearance. She is not ill-appearing.  Abdominal:      Comments: ~3 cm bulge, soft, no skin changes   Musculoskeletal:        General: No swelling or tenderness.  Skin:    General: Skin is warm and dry.     Findings: No erythema or lesion.  Neurological:     Mental Status: She is alert and oriented to person, place, and time.  Psychiatric:        Mood and Affect: Mood normal.        Behavior: Behavior normal.        Thought Content: Thought content normal.        Judgment: Judgment normal.     Assessment & Plan:   Problem List Items Addressed This Visit   None Visit Diagnoses       Right groin mass    -  Primary Intermittent, non-tender, reducible abdominal bulge. Discussed emergency signs of incarceration or strangulation. - Ordered ultrasound to evaluate, suspect hernia - Advised to monitor for symptoms of incarceration or strangulation, including pain, discoloration, or inability to reduce the bulge. - Instructed to brace during activities increasing intra-abdominal pressure.   Relevant Orders   US  Pelvis Limited           Follow-up: Return for - pending results or sooner if needed.   Waddell FURY Almarie, DNP, FNP-C  I,Emily Lagle,acting as a neurosurgeon for Waddell KATHEE Almarie, NP.,have documented all relevant documentation on the behalf of Waddell KATHEE Almarie, NP.   I, Waddell KATHEE Almarie, NP, have reviewed all documentation for this visit. The documentation on 10/24/2024 for the exam, diagnosis, procedures, and orders are all accurate and complete.

## 2024-10-26 ENCOUNTER — Ambulatory Visit (HOSPITAL_BASED_OUTPATIENT_CLINIC_OR_DEPARTMENT_OTHER): Admission: RE | Admit: 2024-10-26 | Discharge: 2024-10-26 | Attending: Family Medicine

## 2024-10-26 DIAGNOSIS — R1909 Other intra-abdominal and pelvic swelling, mass and lump: Secondary | ICD-10-CM | POA: Diagnosis not present

## 2024-11-01 ENCOUNTER — Encounter: Payer: Self-pay | Admitting: Family Medicine

## 2024-11-03 NOTE — Telephone Encounter (Signed)
 Looks like this still has not been read.

## 2024-11-05 ENCOUNTER — Other Ambulatory Visit: Payer: Self-pay | Admitting: Family Medicine

## 2024-11-05 DIAGNOSIS — Z716 Tobacco abuse counseling: Secondary | ICD-10-CM

## 2024-11-05 DIAGNOSIS — F17200 Nicotine dependence, unspecified, uncomplicated: Secondary | ICD-10-CM

## 2024-11-10 ENCOUNTER — Ambulatory Visit: Payer: Self-pay | Admitting: Family Medicine

## 2024-11-10 DIAGNOSIS — R935 Abnormal findings on diagnostic imaging of other abdominal regions, including retroperitoneum: Secondary | ICD-10-CM

## 2024-11-10 DIAGNOSIS — R1909 Other intra-abdominal and pelvic swelling, mass and lump: Secondary | ICD-10-CM

## 2024-11-10 DIAGNOSIS — N858 Other specified noninflammatory disorders of uterus: Secondary | ICD-10-CM

## 2024-11-20 ENCOUNTER — Ambulatory Visit (HOSPITAL_BASED_OUTPATIENT_CLINIC_OR_DEPARTMENT_OTHER)
Admission: RE | Admit: 2024-11-20 | Discharge: 2024-11-20 | Disposition: A | Discharge status: Home / Self Care | Source: Ambulatory Visit | Attending: Family Medicine | Admitting: Family Medicine

## 2024-11-20 DIAGNOSIS — R935 Abnormal findings on diagnostic imaging of other abdominal regions, including retroperitoneum: Secondary | ICD-10-CM | POA: Insufficient documentation

## 2024-11-25 ENCOUNTER — Telehealth: Payer: Self-pay | Admitting: Family Medicine

## 2024-11-25 DIAGNOSIS — E782 Mixed hyperlipidemia: Secondary | ICD-10-CM

## 2024-11-25 NOTE — Telephone Encounter (Signed)
 Good morning this patient is scheduled for the lab. The notes say its for a lipid but I need an order.

## 2024-11-25 NOTE — Telephone Encounter (Signed)
 Orders placed.

## 2024-11-25 NOTE — Telephone Encounter (Signed)
 Please advise? Looks like there are orders for cardiology in chart. Are you okay w/ us  putting those in your name?

## 2024-11-27 ENCOUNTER — Ambulatory Visit: Payer: Self-pay | Admitting: Family Medicine

## 2024-11-27 ENCOUNTER — Other Ambulatory Visit (INDEPENDENT_AMBULATORY_CARE_PROVIDER_SITE_OTHER)

## 2024-11-27 DIAGNOSIS — E782 Mixed hyperlipidemia: Secondary | ICD-10-CM | POA: Diagnosis not present

## 2024-11-27 LAB — LIPID PANEL
Cholesterol: 141 mg/dL (ref 28–200)
HDL: 46.4 mg/dL
LDL Cholesterol: 61 mg/dL (ref 10–99)
NonHDL: 94.34
Total CHOL/HDL Ratio: 3
Triglycerides: 165 mg/dL — ABNORMAL HIGH (ref 10.0–149.0)
VLDL: 33 mg/dL (ref 0.0–40.0)

## 2024-11-27 LAB — HEPATIC FUNCTION PANEL
ALT: 30 U/L (ref 3–35)
AST: 26 U/L (ref 5–37)
Albumin: 4.5 g/dL (ref 3.5–5.2)
Alkaline Phosphatase: 79 U/L (ref 39–117)
Bilirubin, Direct: 0.1 mg/dL (ref 0.1–0.3)
Total Bilirubin: 0.6 mg/dL (ref 0.2–1.2)
Total Protein: 6.9 g/dL (ref 6.0–8.3)

## 2024-11-28 NOTE — Addendum Note (Signed)
 Addended by: ALMARIE BIRMINGHAM B on: 11/28/2024 01:40 PM   Modules accepted: Orders

## 2024-12-01 NOTE — Progress Notes (Signed)
 "  Established Patient Office Visit Subjective:  Patient ID: Kristin Benjamin, female    DOB: 03/14/1956  Age: 69 y.o. MRN: [id number]  CC: No chief complaint on file.     HPI Kristin Benjamin is here for 43-month follow-up.    Hypertension: - Medications: Valsartan -HCTZ 160-12.5 mg daily - Compliance: good - Checking BP at home:  - Denies any SOB, recurrent headaches, CP, vision changes, LE edema, dizziness, palpitations, or medication side effects. - Diet: heart healthy - Exercise: walking, swimming, pickleball      Hyperlipidemia, CAD: - CT chest 2024 showed aortic atherosclerosis and two-vessel coronary artery disease; established with Dr. Delford 08/2024. - medications: atorvastatin  80 mg daily, Zetia  10 mg added by Cardiologist. - compliance: good - medication SEs: no The 10-year ASCVD risk score (Arnett DK, et al., 2019) is: 9.7%   Values used to calculate the score:     Age: 33 years     Clinically relevant sex: Female     Is Non-Hispanic African American: No     Diabetic: No     Tobacco smoker: Yes     Systolic Blood Pressure: 109 mmHg     Is BP treated: Yes     HDL Cholesterol: 46.4 mg/dL     Total Cholesterol: 141 mg/dL    Mood follow-up: - Diagnosis: anxiety and depression, Insomnia - Treatment: Wellbutrin  150 mg daily, Zoloft  50 mg daily, and Trazodone  25-50 mg as needed. - Medication side effects: none - SI/HI: no - Update: ***  ADHD/ADD: - Medications:  - Satisfied with current therapy: *** - Controlled substance contract: *** - UDS: *** - Previous psychiatry evaluation: {Blank single:19197::yes,no} - Previous medications: Adderall 5 mg daily - ADHD Medication Side Effects: {Blank single:19197::yes,no}    Decreased appetite: {Blank single:19197::yes,no}    Headache: {Blank single:19197::yes,no}    Sleeping disturbance pattern: {Blank single:19197::yes,no}    Irritability: {Blank single:19197::yes,no}    Anxiousness:  {Blank single:19197::yes,no}    Dizziness: {Blank single:19197::yes,no}     09/17/2024    1:14 PM 12/25/2023    9:56 AM 09/18/2023    8:17 AM 03/19/2023    8:52 AM 09/26/2022    9:17 AM  Depression screen PHQ 2/9  Decreased Interest 0 0 0 1 0  Down, Depressed, Hopeless 1 0 0 2 0  PHQ - 2 Score 1 0 0 3 0  Altered sleeping 1 2  1    Tired, decreased energy 0 1  0   Change in appetite 0 0  0   Feeling bad or failure about yourself  0 0  0   Trouble concentrating 0 0  0   Moving slowly or fidgety/restless 0 0  0   Suicidal thoughts 0 0  0   PHQ-9 Score 2  3   4     Difficult doing work/chores  Somewhat difficult  Not difficult at all      Data saved with a previous flowsheet row definition      12/25/2023    9:57 AM 03/19/2023    8:52 AM  GAD 7 : Generalized Anxiety Score  Nervous, Anxious, on Edge 0  0   Control/stop worrying 0  0   Worry too much - different things 0  0   Trouble relaxing 0  0   Restless 0  0   Easily annoyed or irritable 0  1   Afraid - awful might happen 0  1   Total GAD 7 Score 0 2  Anxiety Difficulty  Not difficult at all Not difficult at all     Data saved with a previous flowsheet row definition     Past Medical History:  Diagnosis Date   Allergy    Anxiety    Arthritis    Depression    Headache    history of   Heart murmur    in the past   Hx of adenomatous colonic polyps 07/17/2017   Hyperlipidemia    Hypertension     Past Surgical History:  Procedure Laterality Date   bladder tack     COLONOSCOPY  2006   and 2018   FUNCTIONAL ENDOSCOPIC SINUS SURGERY     JOINT REPLACEMENT     Left total knee arthroplasty Dr. Vernetta 10-18-18   KNEE ARTHROSCOPY W/ MENISCAL REPAIR Right 08/2016   TOTAL KNEE ARTHROPLASTY Left 10/18/2018   Procedure: LEFT TOTAL KNEE ARTHROPLASTY;  Surgeon: Vernetta Lonni GRADE, MD;  Location: [facility];  Service: Orthopedics;  Laterality: Left;   TOTAL KNEE ARTHROPLASTY Right 09/28/2023   Procedure: RIGHT TOTAL  KNEE ARTHROPLASTY;  Surgeon: Vernetta Lonni GRADE, MD;  Location: [facility];  Service: Orthopedics;  Laterality: Right;   TUBAL LIGATION  1993    Family History  Problem Relation Age of Onset   Hyperlipidemia Mother    Arthritis Mother    Depression Father    Hyperlipidemia Father    Diabetes Father    Alcohol abuse Father    Arthritis Father    Arthritis Sister    Hyperlipidemia Sister    Arthritis Sister    Depression Sister    Hyperlipidemia Sister    Hyperlipidemia Maternal Grandmother    Arthritis Maternal Grandmother    Stroke Maternal Grandfather    Hyperlipidemia Maternal Grandfather    Arthritis Maternal Grandfather    Hyperlipidemia Paternal Grandmother    Arthritis Paternal Grandmother    Hyperlipidemia Paternal Grandfather    Arthritis Paternal Grandfather    Colon cancer Neg Hx    Esophageal cancer Neg Hx    Rectal cancer Neg Hx    Stomach cancer Neg Hx    Colon polyps Neg Hx     Social History   Socioeconomic History   Marital status: Married    Spouse name: Not on file   Number of children: Not on file   Years of education: Not on file   Highest education level: Associate degree: occupational, scientist, product/process development, or vocational program  Occupational History   Occupation: retired  Tobacco Use   Smoking status: Former    Current packs/day: 1.00    Average packs/day: 1 pack/day for 42.0 years (42.0 ttl pk-yrs)    Types: Cigarettes   Smokeless tobacco: Never   Tobacco comments:    Patient is interested in utilizing Wellbutrin  and Nicotine patchs again  Vaping Use   Vaping status: Never Used  Substance and Sexual Activity   Alcohol use: Yes    Alcohol/week: 3.0 - 5.0 standard drinks of alcohol    Types: 3 - 5 Glasses of wine per week    Comment: 3-5 wine per week per pt   Drug use: Yes    Types: Marijuana    Comment: occasionally--monthly   Sexual activity: Yes  Other Topics Concern   Not on file  Social History Narrative   Not on file   Social  Drivers of Health   Tobacco Use: Medium Risk (10/24/2024)   Patient History    Smoking Tobacco Use: Former    Smokeless Tobacco Use: Never  Passive Exposure: Not on file  Financial Resource Strain: Low Risk (09/15/2024)   Overall Financial Resource Strain (CARDIA)    Difficulty of Paying Living Expenses: Not very hard  Food Insecurity: No Food Insecurity (09/15/2024)   Epic    Worried About Programme Researcher, Broadcasting/film/video in the Last Year: Never true    Ran Out of Food in the Last Year: Never true  Transportation Needs: No Transportation Needs (09/15/2024)   Epic    Lack of Transportation (Medical): No    Lack of Transportation (Non-Medical): No  Physical Activity: Sufficiently Active (09/15/2024)   Exercise Vital Sign    Days of Exercise per Week: 4 days    Minutes of Exercise per Session: 120 min  Stress: Stress Concern Present (09/15/2024)   [employer] of Occupational Health - Occupational Stress Questionnaire    Feeling of Stress: To some extent  Social Connections: Moderately Integrated (09/15/2024)   Social Connection and Isolation Panel    Frequency of Communication with Friends and Family: Twice a week    Frequency of Social Gatherings with Friends and Family: Once a week    Attends Religious Services: Never    Database Administrator or Organizations: Yes    Attends Engineer, Structural: More than 4 times per year    Marital Status: Married  Catering Manager Violence: Not At Risk (09/17/2024)   Epic    Fear of Current or Ex-Partner: No    Emotionally Abused: No    Physically Abused: No    Sexually Abused: No  Depression (PHQ2-9): Low Risk (09/17/2024)   Depression (PHQ2-9)    PHQ-2 Score: 2  Alcohol Screen: Low Risk (09/15/2024)   Alcohol Screen    Last Alcohol Screening Score (AUDIT): 4  Housing: Low Risk (09/15/2024)   Epic    Unable to Pay for Housing in the Last Year: No    Number of Times Moved in the Last Year: 0    Homeless in the Last Year: No  Utilities:  Not At Risk (09/17/2024)   Epic    Threatened with loss of utilities: No  Health Literacy: Not on file    ROS All ROS negative except what is listed in the HPI.   Objective:   Today's Vitals: There were no vitals taken for this visit.  Physical Exam  Assessment & Plan:   Problem List Items Addressed This Visit       Active Problems   Mixed hyperlipidemia - Primary (Chronic)   Primary hypertension (Chronic)   Anxiety and depression (Chronic)   Attention deficit hyperactivity disorder (ADHD) (Chronic)   High risk medication use (Chronic)   Insomnia      Follow-up: No follow-ups on file.   Waddell FURY Almarie, DNP, FNP-C  I,Emily Lagle,acting as a neurosurgeon for Waddell KATHEE Almarie, NP.,have documented all relevant documentation on the behalf of Waddell KATHEE Almarie, NP.  I, Waddell KATHEE Almarie, NP, have reviewed all documentation for this visit. The documentation on 12/02/2024 for the exam, diagnosis, procedures, and orders are all accurate and complete.  "

## 2024-12-02 ENCOUNTER — Encounter: Payer: Self-pay | Admitting: Family Medicine

## 2024-12-02 ENCOUNTER — Ambulatory Visit: Admitting: Family Medicine

## 2024-12-02 VITALS — BP 109/69 | HR 80 | Ht 68.0 in | Wt 174.0 lb

## 2024-12-02 DIAGNOSIS — I10 Essential (primary) hypertension: Secondary | ICD-10-CM | POA: Diagnosis not present

## 2024-12-02 DIAGNOSIS — F909 Attention-deficit hyperactivity disorder, unspecified type: Secondary | ICD-10-CM

## 2024-12-02 DIAGNOSIS — F32A Depression, unspecified: Secondary | ICD-10-CM | POA: Diagnosis not present

## 2024-12-02 DIAGNOSIS — F419 Anxiety disorder, unspecified: Secondary | ICD-10-CM | POA: Diagnosis not present

## 2024-12-02 DIAGNOSIS — E782 Mixed hyperlipidemia: Secondary | ICD-10-CM

## 2024-12-02 DIAGNOSIS — Z79899 Other long term (current) drug therapy: Secondary | ICD-10-CM

## 2024-12-02 DIAGNOSIS — G4700 Insomnia, unspecified: Secondary | ICD-10-CM

## 2024-12-02 MED ORDER — BUPROPION HCL ER (SR) 150 MG PO TB12: 150.0000 mg | ORAL_TABLET | Freq: Every day | ORAL | 1 refills | Status: AC

## 2024-12-02 MED ORDER — TRAZODONE HCL 50 MG PO TABS: 25.0000 mg | ORAL_TABLET | Freq: Every evening | ORAL | 0 refills | Status: AC | PRN

## 2024-12-02 MED ORDER — VALSARTAN-HYDROCHLOROTHIAZIDE 160-12.5 MG PO TABS: 1.0000 | ORAL_TABLET | Freq: Every day | ORAL | 1 refills | Status: AC

## 2024-12-02 MED ORDER — EZETIMIBE 10 MG PO TABS: 10.0000 mg | ORAL_TABLET | Freq: Every day | ORAL | 1 refills | Status: AC

## 2024-12-02 MED ORDER — SERTRALINE HCL 50 MG PO TABS: 50.0000 mg | ORAL_TABLET | Freq: Every day | ORAL | 1 refills | Status: AC

## 2024-12-02 MED ORDER — ATORVASTATIN CALCIUM 80 MG PO TABS: 80.0000 mg | ORAL_TABLET | Freq: Every day | ORAL | 1 refills | Status: AC

## 2024-12-02 NOTE — Assessment & Plan Note (Signed)
 Blood pressure is at goal for age and co-morbidities.   Recommendations: valsartan -HCTZ 160-12.5 mg daily - BP goal <130/80 - monitor and log blood pressures at home - check around the same time each day in a relaxed setting - Limit salt to <2000 mg/day - Follow DASH eating plan (heart healthy diet) - limit alcohol to 2 standard drinks per day for men and 1 per day for women - avoid tobacco products - get at least 2 hours of regular aerobic exercise weekly Patient aware of signs/symptoms requiring further/urgent evaluation.

## 2024-12-02 NOTE — Assessment & Plan Note (Signed)
 Trazodone  has been working well. Usually takes half tablet at bedtime. Focusing on sleep hygiene and good bedtime routine.

## 2024-12-02 NOTE — Assessment & Plan Note (Signed)
-  Reviewed most recent lipid panel, improving! -Medication management: continue Lipitor 80 mg daily and Zetia  10 mg daily -Diet low in saturated fat -Regular exercise - at least 30 minutes, 5 times per week

## 2024-12-02 NOTE — Assessment & Plan Note (Signed)
 Patient has been off Adderall and reports managing well without it.

## 2024-12-02 NOTE — Assessment & Plan Note (Addendum)
 Stable on Zoloft  50 mg and Wellbutrin  150 mg. No SI/HI. PHQ9/GAD7 reviewed.

## 2024-12-04 ENCOUNTER — Encounter: Payer: Self-pay | Admitting: Cardiovascular Disease

## 2024-12-04 DIAGNOSIS — E782 Mixed hyperlipidemia: Secondary | ICD-10-CM

## 2024-12-04 DIAGNOSIS — I2510 Atherosclerotic heart disease of native coronary artery without angina pectoris: Secondary | ICD-10-CM

## 2024-12-05 ENCOUNTER — Telehealth: Payer: Self-pay | Admitting: Pharmacy Technician

## 2024-12-05 ENCOUNTER — Other Ambulatory Visit (HOSPITAL_COMMUNITY): Payer: Self-pay

## 2024-12-05 NOTE — Telephone Encounter (Signed)
" ° °  Pharmacy Patient Advocate Encounter   Received notification from Patient Advice Request messages that prior authorization for repatha is required/requested.   Insurance verification completed.   The patient is insured through [facility].   Per test claim: PA required; PA submitted to above mentioned insurance via Latent Key/confirmation #/[id number] Status is pending  "

## 2024-12-08 ENCOUNTER — Other Ambulatory Visit (HOSPITAL_COMMUNITY): Payer: Self-pay

## 2024-12-08 NOTE — Telephone Encounter (Signed)
 Pharmacy Patient Advocate Encounter  Received notification from [facility] that Prior Authorization for repatha  has been APPROVED from 12/05/24 to 12/05/25. Ran test claim, Copay is $552.73. This test claim was processed through [facility]- copay amounts may vary at other pharmacies due to pharmacy/plan contracts, or as the patient moves through the different stages of their insurance plan.   PA #/Case ID/Reference #: [id number]    It says 552.73 applied to deductible -doesn't say how much is left before copay will go down

## 2024-12-09 MED ORDER — REPATHA SURECLICK 140 MG/ML ~~LOC~~ SOAJ: 1.0000 mL | SUBCUTANEOUS | 5 refills | Status: AC

## 2024-12-09 NOTE — Addendum Note (Signed)
 Addended by: DARRELL BAREFOOT on: 12/09/2024 02:52 PM   Modules accepted: Orders

## 2024-12-10 ENCOUNTER — Ambulatory Visit (HOSPITAL_COMMUNITY)
Admission: RE | Admit: 2024-12-10 | Discharge: 2024-12-10 | Disposition: A | Discharge status: Home / Self Care | Source: Ambulatory Visit | Attending: Family Medicine | Admitting: Family Medicine

## 2024-12-10 DIAGNOSIS — N858 Other specified noninflammatory disorders of uterus: Secondary | ICD-10-CM | POA: Insufficient documentation

## 2024-12-10 DIAGNOSIS — R935 Abnormal findings on diagnostic imaging of other abdominal regions, including retroperitoneum: Secondary | ICD-10-CM | POA: Insufficient documentation

## 2024-12-10 MED ORDER — GADOBUTROL 1 MMOL/ML IV SOLN
8.0000 mL | Freq: Once | INTRAVENOUS | Status: AC | PRN
  Administered 2024-12-10: 8 mL via INTRAVENOUS

## 2024-12-17 ENCOUNTER — Encounter: Payer: Self-pay | Admitting: Family Medicine

## 2024-12-17 NOTE — Telephone Encounter (Signed)
 Called patient. Result note added.

## 2024-12-17 NOTE — Addendum Note (Signed)
 Addended by: Nevin Grizzle B on: 12/17/2024 04:38 PM   Modules accepted: Orders

## 2025-09-22 ENCOUNTER — Ambulatory Visit
